# Patient Record
Sex: Male | Born: 1955 | Race: White | Hispanic: No | Marital: Single | Smoking: Current every day smoker
Health system: Southern US, Community
[De-identification: ages and names within clinical notes are randomized; demographics above are authoritative.]

## PROBLEM LIST (undated history)

## (undated) DIAGNOSIS — C4491 Basal cell carcinoma of skin, unspecified: Secondary | ICD-10-CM

## (undated) DIAGNOSIS — I10 Essential (primary) hypertension: Secondary | ICD-10-CM

## (undated) DIAGNOSIS — C439 Malignant melanoma of skin, unspecified: Secondary | ICD-10-CM

## (undated) DIAGNOSIS — M1990 Unspecified osteoarthritis, unspecified site: Secondary | ICD-10-CM

## (undated) DIAGNOSIS — G894 Chronic pain syndrome: Secondary | ICD-10-CM

## (undated) DIAGNOSIS — E119 Type 2 diabetes mellitus without complications: Secondary | ICD-10-CM

## (undated) DIAGNOSIS — F329 Major depressive disorder, single episode, unspecified: Secondary | ICD-10-CM

## (undated) DIAGNOSIS — F419 Anxiety disorder, unspecified: Secondary | ICD-10-CM

## (undated) DIAGNOSIS — Z973 Presence of spectacles and contact lenses: Secondary | ICD-10-CM

## (undated) HISTORY — PX: OTHER SURGICAL HISTORY: SHX169

## (undated) HISTORY — DX: Essential (primary) hypertension: I10

## (undated) HISTORY — DX: Basal cell carcinoma of skin, unspecified: C44.91

## (undated) HISTORY — DX: Chronic pain syndrome: G89.4

## (undated) HISTORY — DX: Type 2 diabetes mellitus without complications: E11.9

## (undated) HISTORY — DX: Malignant melanoma of skin, unspecified: C43.9

## (undated) HISTORY — DX: Anxiety disorder, unspecified: F41.9

## (undated) HISTORY — DX: Unspecified osteoarthritis, unspecified site: M19.90

## (undated) HISTORY — DX: Major depressive disorder, single episode, unspecified: F32.9

---

## 2003-05-09 ENCOUNTER — Ambulatory Visit (HOSPITAL_COMMUNITY): Admission: RE | Admit: 2003-05-09 | Discharge: 2003-05-09 | Payer: Self-pay | Admitting: Internal Medicine

## 2003-05-10 ENCOUNTER — Ambulatory Visit (HOSPITAL_COMMUNITY): Admission: RE | Admit: 2003-05-10 | Discharge: 2003-05-10 | Payer: Self-pay | Admitting: General Surgery

## 2003-05-15 ENCOUNTER — Encounter (INDEPENDENT_AMBULATORY_CARE_PROVIDER_SITE_OTHER): Payer: Self-pay | Admitting: *Deleted

## 2003-05-15 ENCOUNTER — Ambulatory Visit (HOSPITAL_BASED_OUTPATIENT_CLINIC_OR_DEPARTMENT_OTHER): Admission: RE | Admit: 2003-05-15 | Discharge: 2003-05-15 | Payer: Self-pay | Admitting: General Surgery

## 2003-05-15 ENCOUNTER — Ambulatory Visit (HOSPITAL_COMMUNITY): Admission: RE | Admit: 2003-05-15 | Discharge: 2003-05-15 | Payer: Self-pay | Admitting: General Surgery

## 2003-06-07 ENCOUNTER — Ambulatory Visit (HOSPITAL_COMMUNITY): Admission: RE | Admit: 2003-06-07 | Discharge: 2003-06-07 | Payer: Self-pay | Admitting: General Surgery

## 2003-07-11 ENCOUNTER — Inpatient Hospital Stay (HOSPITAL_COMMUNITY): Admission: RE | Admit: 2003-07-11 | Discharge: 2003-07-13 | Payer: Self-pay | Admitting: General Surgery

## 2003-07-11 ENCOUNTER — Encounter (INDEPENDENT_AMBULATORY_CARE_PROVIDER_SITE_OTHER): Payer: Self-pay | Admitting: *Deleted

## 2003-10-10 ENCOUNTER — Encounter: Admission: RE | Admit: 2003-10-10 | Discharge: 2003-11-29 | Payer: Self-pay | Admitting: General Surgery

## 2004-02-14 ENCOUNTER — Ambulatory Visit: Payer: Self-pay | Admitting: *Deleted

## 2004-03-05 ENCOUNTER — Ambulatory Visit: Payer: Self-pay | Admitting: Family Medicine

## 2004-03-10 ENCOUNTER — Ambulatory Visit: Payer: Self-pay | Admitting: Family Medicine

## 2004-04-10 ENCOUNTER — Ambulatory Visit: Payer: Self-pay | Admitting: Family Medicine

## 2004-04-18 ENCOUNTER — Ambulatory Visit: Payer: Self-pay | Admitting: Family Medicine

## 2004-05-12 ENCOUNTER — Ambulatory Visit: Payer: Self-pay | Admitting: Internal Medicine

## 2004-10-21 ENCOUNTER — Ambulatory Visit: Payer: Self-pay | Admitting: Family Medicine

## 2005-04-22 ENCOUNTER — Ambulatory Visit: Payer: Self-pay | Admitting: Family Medicine

## 2005-08-27 ENCOUNTER — Ambulatory Visit: Payer: Self-pay | Admitting: Family Medicine

## 2005-09-22 ENCOUNTER — Ambulatory Visit: Payer: Self-pay | Admitting: Family Medicine

## 2005-10-20 ENCOUNTER — Ambulatory Visit: Payer: Self-pay | Admitting: Family Medicine

## 2005-11-03 ENCOUNTER — Encounter (INDEPENDENT_AMBULATORY_CARE_PROVIDER_SITE_OTHER): Payer: Self-pay | Admitting: *Deleted

## 2005-11-03 ENCOUNTER — Ambulatory Visit (HOSPITAL_COMMUNITY): Admission: RE | Admit: 2005-11-03 | Discharge: 2005-11-03 | Payer: Self-pay | Admitting: Surgery

## 2005-12-29 ENCOUNTER — Ambulatory Visit: Payer: Self-pay | Admitting: Family Medicine

## 2006-02-24 ENCOUNTER — Ambulatory Visit: Payer: Self-pay | Admitting: Family Medicine

## 2006-03-17 ENCOUNTER — Ambulatory Visit: Payer: Self-pay | Admitting: Family Medicine

## 2006-03-18 ENCOUNTER — Ambulatory Visit: Payer: Self-pay | Admitting: Family Medicine

## 2006-04-05 ENCOUNTER — Ambulatory Visit: Payer: Self-pay | Admitting: Family Medicine

## 2006-04-14 ENCOUNTER — Ambulatory Visit: Payer: Self-pay | Admitting: Nurse Practitioner

## 2006-04-27 ENCOUNTER — Ambulatory Visit: Payer: Self-pay | Admitting: Family Medicine

## 2006-06-15 ENCOUNTER — Ambulatory Visit: Payer: Self-pay | Admitting: Family Medicine

## 2006-09-20 ENCOUNTER — Ambulatory Visit: Payer: Self-pay | Admitting: Family Medicine

## 2006-10-21 ENCOUNTER — Ambulatory Visit: Payer: Self-pay | Admitting: Family Medicine

## 2006-12-21 ENCOUNTER — Ambulatory Visit: Payer: Self-pay | Admitting: Internal Medicine

## 2007-02-23 ENCOUNTER — Encounter (INDEPENDENT_AMBULATORY_CARE_PROVIDER_SITE_OTHER): Payer: Self-pay | Admitting: *Deleted

## 2007-07-08 ENCOUNTER — Ambulatory Visit: Payer: Self-pay | Admitting: Internal Medicine

## 2007-07-08 LAB — CONVERTED CEMR LAB
ALT: 24 units/L (ref 0–53)
AST: 21 units/L (ref 0–37)
Albumin: 4.1 g/dL (ref 3.5–5.2)
Alkaline Phosphatase: 53 units/L (ref 39–117)
BUN: 14 mg/dL (ref 6–23)
Basophils Absolute: 0.1 10*3/uL (ref 0.0–0.1)
Basophils Relative: 1 % (ref 0–1)
CO2: 21 meq/L (ref 19–32)
Calcium: 9.6 mg/dL (ref 8.4–10.5)
Chloride: 106 meq/L (ref 96–112)
Cholesterol: 134 mg/dL (ref 0–200)
Creatinine, Ser: 0.9 mg/dL (ref 0.40–1.50)
Eosinophils Absolute: 0.4 10*3/uL (ref 0.0–0.7)
Eosinophils Relative: 4 % (ref 0–5)
Glucose, Bld: 144 mg/dL — ABNORMAL HIGH (ref 70–99)
HCT: 45.8 % (ref 39.0–52.0)
HDL: 43 mg/dL (ref >39)
Hemoglobin: 15.6 g/dL (ref 13.0–17.0)
LDL Cholesterol: 79 mg/dL (ref 0–99)
Lymphocytes Relative: 29 % (ref 12–46)
Lymphs Abs: 3.1 10*3/uL (ref 0.7–4.0)
MCHC: 34.1 g/dL (ref 30.0–36.0)
MCV: 92.2 fL (ref 78.0–100.0)
Monocytes Absolute: 0.9 10*3/uL (ref 0.1–1.0)
Monocytes Relative: 9 % (ref 3–12)
Neutro Abs: 6.3 10*3/uL (ref 1.7–7.7)
Neutrophils Relative %: 58 % (ref 43–77)
Platelets: 395 10*3/uL (ref 150–400)
Potassium: 4.9 meq/L (ref 3.5–5.3)
RBC: 4.97 M/uL (ref 4.22–5.81)
RDW: 13.1 % (ref 11.5–15.5)
Sodium: 139 meq/L (ref 135–145)
TSH: 0.982 microintl units/mL (ref 0.350–5.50)
Total Bilirubin: 0.7 mg/dL (ref 0.3–1.2)
Total CHOL/HDL Ratio: 3.1
Total Protein: 6.7 g/dL (ref 6.0–8.3)
Triglycerides: 58 mg/dL (ref <150)
VLDL: 12 mg/dL (ref 0–40)
WBC: 10.8 10*3/uL — ABNORMAL HIGH (ref 4.0–10.5)

## 2007-07-22 ENCOUNTER — Ambulatory Visit: Payer: Self-pay | Admitting: Family Medicine

## 2007-10-26 ENCOUNTER — Ambulatory Visit: Payer: Self-pay | Admitting: Internal Medicine

## 2007-11-08 ENCOUNTER — Ambulatory Visit: Payer: Self-pay | Admitting: Internal Medicine

## 2008-02-15 ENCOUNTER — Ambulatory Visit: Payer: Self-pay | Admitting: Internal Medicine

## 2008-05-01 ENCOUNTER — Ambulatory Visit: Payer: Self-pay | Admitting: Internal Medicine

## 2008-06-11 ENCOUNTER — Ambulatory Visit: Payer: Self-pay | Admitting: Internal Medicine

## 2008-07-10 ENCOUNTER — Ambulatory Visit: Payer: Self-pay | Admitting: Family Medicine

## 2008-09-03 ENCOUNTER — Ambulatory Visit: Payer: Self-pay | Admitting: Family Medicine

## 2008-09-03 ENCOUNTER — Encounter (INDEPENDENT_AMBULATORY_CARE_PROVIDER_SITE_OTHER): Payer: Self-pay | Admitting: Adult Health

## 2008-09-03 LAB — CONVERTED CEMR LAB
ALT: 43 units/L (ref 0–53)
AST: 34 units/L (ref 0–37)
Albumin: 4.2 g/dL (ref 3.5–5.2)
Alkaline Phosphatase: 59 units/L (ref 39–117)
BUN: 18 mg/dL (ref 6–23)
Basophils Absolute: 0.1 10*3/uL (ref 0.0–0.1)
Basophils Relative: 1 % (ref 0–1)
CO2: 22 meq/L (ref 19–32)
Calcium: 9.5 mg/dL (ref 8.4–10.5)
Chloride: 106 meq/L (ref 96–112)
Cholesterol: 150 mg/dL (ref 0–200)
Creatinine, Ser: 0.94 mg/dL (ref 0.40–1.50)
Eosinophils Absolute: 0.5 10*3/uL (ref 0.0–0.7)
Eosinophils Relative: 4 % (ref 0–5)
Glucose, Bld: 95 mg/dL (ref 70–99)
HCT: 46.4 % (ref 39.0–52.0)
HDL: 51 mg/dL (ref >39)
Hemoglobin: 15.9 g/dL (ref 13.0–17.0)
LDL Cholesterol: 87 mg/dL (ref 0–99)
Lymphocytes Relative: 34 % (ref 12–46)
Lymphs Abs: 4.3 10*3/uL — ABNORMAL HIGH (ref 0.7–4.0)
MCHC: 34.3 g/dL (ref 30.0–36.0)
MCV: 94.3 fL (ref 78.0–100.0)
Microalb, Ur: 0.5 mg/dL (ref 0.00–1.89)
Monocytes Absolute: 1.4 10*3/uL — ABNORMAL HIGH (ref 0.1–1.0)
Monocytes Relative: 11 % (ref 3–12)
Neutro Abs: 6.3 10*3/uL (ref 1.7–7.7)
Neutrophils Relative %: 50 % (ref 43–77)
PSA: 1.55 ng/mL (ref 0.10–4.00)
Platelets: 312 10*3/uL (ref 150–400)
Potassium: 4.1 meq/L (ref 3.5–5.3)
RBC: 4.92 M/uL (ref 4.22–5.81)
RDW: 13.6 % (ref 11.5–15.5)
Sodium: 140 meq/L (ref 135–145)
TSH: 1.435 microintl units/mL (ref 0.350–4.500)
Total Bilirubin: 0.6 mg/dL (ref 0.3–1.2)
Total CHOL/HDL Ratio: 2.9
Total Protein: 7.1 g/dL (ref 6.0–8.3)
Triglycerides: 62 mg/dL (ref <150)
VLDL: 12 mg/dL (ref 0–40)
WBC: 12.5 10*3/uL — ABNORMAL HIGH (ref 4.0–10.5)

## 2008-09-11 ENCOUNTER — Ambulatory Visit: Payer: Self-pay | Admitting: Family Medicine

## 2008-10-02 ENCOUNTER — Ambulatory Visit: Payer: Self-pay | Admitting: Internal Medicine

## 2008-10-16 ENCOUNTER — Ambulatory Visit: Payer: Self-pay | Admitting: Oncology

## 2008-11-08 LAB — CBC WITH DIFFERENTIAL/PLATELET
BASO%: 0.3 % (ref 0.0–2.0)
Basophils Absolute: 0 10*3/uL (ref 0.0–0.1)
EOS%: 6.4 % (ref 0.0–7.0)
Eosinophils Absolute: 0.8 10*3/uL — ABNORMAL HIGH (ref 0.0–0.5)
HCT: 44.6 % (ref 38.4–49.9)
HGB: 15.3 g/dL (ref 13.0–17.1)
LYMPH%: 27.3 % (ref 14.0–49.0)
MCH: 32.3 pg (ref 27.2–33.4)
MCHC: 34.3 g/dL (ref 32.0–36.0)
MCV: 94 fL (ref 79.3–98.0)
MONO#: 1.1 10*3/uL — ABNORMAL HIGH (ref 0.1–0.9)
MONO%: 8.8 % (ref 0.0–14.0)
NEUT#: 6.9 10*3/uL — ABNORMAL HIGH (ref 1.5–6.5)
NEUT%: 57.2 % (ref 39.0–75.0)
Platelets: 254 10*3/uL (ref 140–400)
RBC: 4.74 10*6/uL (ref 4.20–5.82)
RDW: 12.9 % (ref 11.0–14.6)
WBC: 12.1 10*3/uL — ABNORMAL HIGH (ref 4.0–10.3)
lymph#: 3.3 10*3/uL (ref 0.9–3.3)

## 2008-11-08 LAB — COMPREHENSIVE METABOLIC PANEL
ALT: 32 U/L (ref 0–53)
AST: 29 U/L (ref 0–37)
Albumin: 3.9 g/dL (ref 3.5–5.2)
Alkaline Phosphatase: 51 U/L (ref 39–117)
BUN: 20 mg/dL (ref 6–23)
CO2: 24 mEq/L (ref 19–32)
Calcium: 9.1 mg/dL (ref 8.4–10.5)
Chloride: 104 mEq/L (ref 96–112)
Creatinine, Ser: 1.01 mg/dL (ref 0.40–1.50)
Glucose, Bld: 185 mg/dL — ABNORMAL HIGH (ref 70–99)
Potassium: 4.4 mEq/L (ref 3.5–5.3)
Sodium: 137 mEq/L (ref 135–145)
Total Bilirubin: 0.6 mg/dL (ref 0.3–1.2)
Total Protein: 6.6 g/dL (ref 6.0–8.3)

## 2008-11-08 LAB — LACTATE DEHYDROGENASE: LDH: 147 U/L (ref 94–250)

## 2008-12-12 ENCOUNTER — Ambulatory Visit: Payer: Self-pay | Admitting: Internal Medicine

## 2009-03-07 ENCOUNTER — Encounter (INDEPENDENT_AMBULATORY_CARE_PROVIDER_SITE_OTHER): Payer: Self-pay | Admitting: Adult Health

## 2009-03-07 ENCOUNTER — Ambulatory Visit: Payer: Self-pay | Admitting: Internal Medicine

## 2009-03-07 LAB — CONVERTED CEMR LAB
ALT: 39 units/L (ref 0–53)
AST: 37 units/L (ref 0–37)
Albumin: 4.1 g/dL (ref 3.5–5.2)
Alkaline Phosphatase: 65 units/L (ref 39–117)
BUN: 15 mg/dL (ref 6–23)
CO2: 18 meq/L — ABNORMAL LOW (ref 19–32)
Calcium: 9 mg/dL (ref 8.4–10.5)
Chloride: 106 meq/L (ref 96–112)
Cholesterol: 156 mg/dL (ref 0–200)
Creatinine, Ser: 0.97 mg/dL (ref 0.40–1.50)
Glucose, Bld: 112 mg/dL — ABNORMAL HIGH (ref 70–99)
HDL: 41 mg/dL (ref >39)
LDL Cholesterol: 102 mg/dL — ABNORMAL HIGH (ref 0–99)
Potassium: 4.3 meq/L (ref 3.5–5.3)
Sodium: 139 meq/L (ref 135–145)
Total Bilirubin: 0.7 mg/dL (ref 0.3–1.2)
Total CHOL/HDL Ratio: 3.8
Total Protein: 7 g/dL (ref 6.0–8.3)
Triglycerides: 65 mg/dL (ref <150)
VLDL: 13 mg/dL (ref 0–40)

## 2009-03-15 ENCOUNTER — Ambulatory Visit: Payer: Self-pay | Admitting: Internal Medicine

## 2009-04-09 ENCOUNTER — Ambulatory Visit: Payer: Self-pay | Admitting: Internal Medicine

## 2009-04-09 ENCOUNTER — Encounter (INDEPENDENT_AMBULATORY_CARE_PROVIDER_SITE_OTHER): Payer: Self-pay | Admitting: Adult Health

## 2009-04-09 LAB — CONVERTED CEMR LAB
BUN: 26 mg/dL — ABNORMAL HIGH (ref 6–23)
CO2: 20 meq/L (ref 19–32)
Calcium: 9.2 mg/dL (ref 8.4–10.5)
Chloride: 102 meq/L (ref 96–112)
Creatinine, Ser: 1.11 mg/dL (ref 0.40–1.50)
Glucose, Bld: 108 mg/dL — ABNORMAL HIGH (ref 70–99)
Potassium: 4.3 meq/L (ref 3.5–5.3)
Sodium: 137 meq/L (ref 135–145)
Vit D, 25-Hydroxy: 42 ng/mL (ref 30–89)

## 2009-06-17 ENCOUNTER — Ambulatory Visit: Payer: Self-pay | Admitting: Internal Medicine

## 2009-06-17 ENCOUNTER — Encounter (INDEPENDENT_AMBULATORY_CARE_PROVIDER_SITE_OTHER): Payer: Self-pay | Admitting: Adult Health

## 2009-06-17 LAB — CONVERTED CEMR LAB
ALT: 32 units/L (ref 0–53)
AST: 24 units/L (ref 0–37)
Albumin: 4.1 g/dL (ref 3.5–5.2)
Alkaline Phosphatase: 53 units/L (ref 39–117)
BUN: 15 mg/dL (ref 6–23)
CO2: 21 meq/L (ref 19–32)
Calcium: 9.1 mg/dL (ref 8.4–10.5)
Chloride: 105 meq/L (ref 96–112)
Cholesterol: 146 mg/dL (ref 0–200)
Creatinine, Ser: 0.91 mg/dL (ref 0.40–1.50)
Glucose, Bld: 166 mg/dL — ABNORMAL HIGH (ref 70–99)
HDL: 41 mg/dL (ref >39)
Hgb A1c MFr Bld: 6.9 % — ABNORMAL HIGH (ref 4.6–6.1)
LDL Cholesterol: 84 mg/dL (ref 0–99)
Potassium: 4.8 meq/L (ref 3.5–5.3)
Pro B Natriuretic peptide (BNP): 19 pg/mL (ref 0.0–100.0)
Sodium: 138 meq/L (ref 135–145)
Total Bilirubin: 0.6 mg/dL (ref 0.3–1.2)
Total CHOL/HDL Ratio: 3.6
Total Protein: 6.8 g/dL (ref 6.0–8.3)
Triglycerides: 105 mg/dL (ref <150)
VLDL: 21 mg/dL (ref 0–40)

## 2009-07-09 ENCOUNTER — Ambulatory Visit: Payer: Self-pay | Admitting: Internal Medicine

## 2009-07-24 ENCOUNTER — Ambulatory Visit: Payer: Self-pay | Admitting: Internal Medicine

## 2009-07-24 ENCOUNTER — Encounter (INDEPENDENT_AMBULATORY_CARE_PROVIDER_SITE_OTHER): Payer: Self-pay | Admitting: Adult Health

## 2009-07-24 LAB — CONVERTED CEMR LAB
BUN: 21 mg/dL (ref 6–23)
CO2: 22 meq/L (ref 19–32)
Calcium: 9.5 mg/dL (ref 8.4–10.5)
Chloride: 106 meq/L (ref 96–112)
Creatinine, Ser: 1.01 mg/dL (ref 0.40–1.50)
Glucose, Bld: 128 mg/dL — ABNORMAL HIGH (ref 70–99)
Potassium: 4.6 meq/L (ref 3.5–5.3)
Sodium: 138 meq/L (ref 135–145)

## 2009-09-16 ENCOUNTER — Ambulatory Visit: Payer: Self-pay | Admitting: Internal Medicine

## 2009-09-16 ENCOUNTER — Encounter (INDEPENDENT_AMBULATORY_CARE_PROVIDER_SITE_OTHER): Payer: Self-pay | Admitting: Adult Health

## 2009-09-16 LAB — CONVERTED CEMR LAB: Microalb, Ur: 0.5 mg/dL (ref 0.00–1.89)

## 2010-01-15 ENCOUNTER — Ambulatory Visit: Payer: Self-pay | Admitting: Internal Medicine

## 2010-01-15 LAB — CONVERTED CEMR LAB
ALT: 26 units/L (ref 0–53)
AST: 23 units/L (ref 0–37)
Albumin: 4 g/dL (ref 3.5–5.2)
Alkaline Phosphatase: 45 units/L (ref 39–117)
BUN: 24 mg/dL — ABNORMAL HIGH (ref 6–23)
CO2: 24 meq/L (ref 19–32)
Calcium: 9 mg/dL (ref 8.4–10.5)
Chloride: 107 meq/L (ref 96–112)
Creatinine, Ser: 0.97 mg/dL (ref 0.40–1.50)
Glucose, Bld: 118 mg/dL — ABNORMAL HIGH (ref 70–99)
Hgb A1c MFr Bld: 7.3 % — ABNORMAL HIGH (ref <5.7)
Magnesium: 1.6 mg/dL (ref 1.5–2.5)
Microalb, Ur: 1.12 mg/dL (ref 0.00–1.89)
Potassium: 4.4 meq/L (ref 3.5–5.3)
Sodium: 141 meq/L (ref 135–145)
Total Bilirubin: 0.6 mg/dL (ref 0.3–1.2)
Total Protein: 6.6 g/dL (ref 6.0–8.3)

## 2010-10-24 NOTE — Op Note (Signed)
NAME:  Charles Manning, Charles Manning                          ACCOUNT NO.:  [id number]   MEDICAL RECORD NO.:  [id number]                   PATIENT TYPE:  INP   LOCATION:  6731                                 FACILITY:  [facility]   PHYSICIAN:  Rose Phi. Maple Hudson, M.D.                DATE OF BIRTH:  1955/10/05   DATE OF PROCEDURE:  07/11/2003  DATE OF DISCHARGE:                                 OPERATIVE REPORT   PREOPERATIVE DIAGNOSIS:  T3A, N1A melanoma right lateral thigh.   POSTOPERATIVE DIAGNOSIS:  T3A, N1A melanoma right lateral thigh.   PROCEDURE:  Right superficial groin dissection.   SURGEON:  Rose Phi. Maple Hudson, M.D.   ASSISTANT:  Currie Paris, M.D.   ANESTHESIA:  General.   DESCRIPTION OF PROCEDURE:  After a suitable general endotracheal anesthesia  was induced, the patient was placed in the supine position.  We slightly  frog legged the right leg to expose the right groin better.  The area was  then prepped and draped.  An incision incorporating and excising the  previous sentinel node biopsy site was outlined beginning just medial to the  anterior superior spine on the right side and extending down to the right  thigh at the distal part of the femoral triangle.  The incision was then  made and we dissected on the upper end down onto the abdominal fascia and  then dissected over the shelving portion of Poupart's ligament down into the  groin.  We then mobilized flaps medially and laterally exposing the vastus  lateralis laterally and to the pubis medially.  Then dissected on the medial  side down onto the adductor muscles and then distally until we identified  the saphenous vein which we doubly ligated and divided.  We then dissected  from medial to lateral and identified the saphenous vein going into the  femoral vein and doubly ligated that and also ligated it and divided it.  We  then exposed the femoral triangle with the femoral vein and the femoral  artery and then dissected all of  the tissue off of the femoral artery and  femoral vein exposing this with identification of the nerves.  We completed  that dissection superiorly to the abdominal fascia and then laterally with  identification and preservation of the lateral femoral cutaneous nerve.  We  then removed all of this tissue.  This gave good exposure and complete  dissection out of the femoral triangle with exposure of the femoral vein and  the femoral artery.   I then mobilized the head of the Sartorius muscle at its junction with the  anterior superior spine and divided that.  I then mobilized enough so that  we could transpose it across the femoral vessels and then suture the  tendinous portion of the Sartorius muscle to the abdominal fascia to cover  the vessels.  Following the completion of this, we had good  hemostasis.  We  thoroughly irrigated the area with saline.  Two 19 French drains were  inserted and brought out through separate stab wounds.  Subcutaneous tissue  was closed with 3-0 Vicryl and the skin with staples.  Dressing applied.  The patient transferred to the recovery room in satisfactory condition  having tolerated the procedure well.                                               Rose Phi. Maple Hudson, M.D.    PRY/MEDQ  D:  07/11/2003  T:  07/11/2003  Job:  [id number]

## 2010-10-24 NOTE — Op Note (Signed)
NAME:  Charles Manning, Charles Manning                          ACCOUNT NO.:  [id number]   MEDICAL RECORD NO.:  [id number]                   PATIENT TYPE:  AMB   LOCATION:  DSC                                  FACILITY:  [facility]   PHYSICIAN:  Rose Phi. Maple Hudson, M.D.                DATE OF BIRTH:  1955-10-19   DATE OF PROCEDURE:  05/15/2003  DATE OF DISCHARGE:                                 OPERATIVE REPORT   PREOPERATIVE DIAGNOSIS:  T2A melanoma of right lateral thigh.   POSTOPERATIVE DIAGNOSIS:  T2A melanoma of right lateral thigh.   OPERATION PERFORMED:  1. Blue dye injection.  2. Right inguinal sentinel lymph node biopsy.  3. Wide excision of melanoma of right thigh with primary closure.   SURGEON:  Rose Phi. Maple Hudson, M.D.   ANESTHESIA:  General.   INDICATIONS FOR PROCEDURE:  This 55 year old married white male had  presented to [facility] and then to Dr. Jorja Loa with a melanoma on the  right lateral thigh, fairly low near the knee.  A biopsy had shown this to  be a melanoma in excess of 2 mm thick without any ulceration.  A  preoperative lymphoscintigram had shown the primary lymph node draining  basin for that area was the right inguinal and femoral area.   DESCRIPTION OF PROCEDURE:  Prior to coming to the operating room 1.5 mCi of  technetium sulfur colloid was injected intradermally around the primary  melanoma site.  After suitable general anesthesia was induced, the patient  was placed in the supine position and about 0.5 mL of Lymphazurin blue was  injected intradermally around the primary melanoma site.  We then prepped  and draped the groin.  Careful scanning with the NeoProbe revealed one hot  and one cooler spot in the femoral area.  A vertical incision was then made  and the first lymph node that was lower down was blue and hot and we removed  it as a sentinel node.  Then just proximal to that was a cooler bluish lymph  node which I also excised as a sentinel node.  There were no other  palpable  blue or hot nodes.  This incision was then closed with 3-0 Vicryl and  subcuticular 4-0 Monocryl and Steri-Strips.  We then reprepped and draped  the right lateral knee and lower thigh.  A long elliptical incision was  outlined around the melanoma.  It was then excised down to the fascia.  Hemostasis was obtained with the cautery.  We mobilized the medial and  lateral flap somewhat  and this gave a 9 x 5 cm defect.  It was closed in two layers with 3-0  Vicryl and 4-0 nylon suture.  Dressings were then applied.  The patient was  transferred to the recovery room in satisfactory condition having tolerated  the procedure well.  Rose Phi. Maple Hudson, M.D.    PRY/MEDQ  D:  05/15/2003  T:  05/16/2003  Job:  [id number]   cc:   Dr. Margarette Canada, [facility]   Ria Bush. Jorja Loa, M.D.  [address]  [place]  [place] [address]  Fax: [phone]

## 2010-10-24 NOTE — Op Note (Signed)
NAMEJAMY, CLECKLER                ACCOUNT NO.:  [id number]   MEDICAL RECORD NO.:  [id number]          PATIENT TYPE:  AMB   LOCATION:  DAY                          FACILITY:  [facility]   PHYSICIAN:  Wilmon Arms. Corliss Skains, M.D. DATE OF BIRTH:  [birth year]   DATE OF PROCEDURE:  11/03/2005  DATE OF DISCHARGE:                                 OPERATIVE REPORT   PREOPERATIVE DIAGNOSIS:  1.  Basal cell carcinoma of the mid-back.  2.  Dysplastic nevus of the left shoulder.   POSTOPERATIVE DIAGNOSIS:  1.  Basal cell carcinoma of the mid-back.  2.  Dysplastic nevus of the left shoulder.   PROCEDURE PERFORMED:  Wide excision of basal cell carcinoma of the mid-back  and dysplastic nevus of the left shoulder.   SURGEON:  Wilmon Arms. Tsuei, M.D.   ANESTHESIA:  Local MAC.   INDICATIONS:  The patient is a 55 year old male with a past medical history  of malignant melanoma the right thigh who presents with a suspicious lesion  from the left shoulder and mid-back.  The mid-back lesion was a basal cell  carcinoma incompletely excised.  The left shoulder lesion was a dysplastic  nevus with some atypia.  He presents for wide excision.   DESCRIPTION OF PROCEDURE:  The patient was brought to the operating room and  placed in the lateral position on his right side with appropriate padding.  An axillary roll was placed.  His back and left shoulder were prepped with  Betadine and draped in a sterile fashion.  A time out was taken to assure  the proper patient and the proper procedure.  Both sites were infiltrated  with a total of 20 mL of 0.25% Marcaine with epinephrine.  An elliptical  incision was made around the left shoulder lesion in a transverse fashion.  Hemostasis was obtained with cautery.  The full thickness skin was excised  with some subcutaneous fat.  Once hemostasis was adequate, the wound was  closed with a line of skin staples.  We repeated this with the mid-back  lesion.  This was excised was as  a  full thickness skin biopsy through into the subcutaneous tissues.  Once  hemostasis was felt to be adequate, the wound was closed with staples.  4x4  dressings were applied.  The patient was awakened and brought to the  recovery room in stable condition.  All sponge, needle, and instrument  counts were correct.      Wilmon Arms. Tsuei, M.D.  Electronically Signed     MKT/MEDQ  D:  11/03/2005  T:  11/03/2005  Job:  [id number]   cc:   Ria Bush. Jorja Loa, M.D.  Fax: [phone]

## 2010-11-07 ENCOUNTER — Ambulatory Visit: Payer: Self-pay | Admitting: Neurosurgery

## 2010-11-12 ENCOUNTER — Encounter: Payer: Self-pay | Attending: Neurosurgery | Admitting: Neurosurgery

## 2010-11-12 DIAGNOSIS — M545 Low back pain, unspecified: Secondary | ICD-10-CM

## 2010-11-12 DIAGNOSIS — M25579 Pain in unspecified ankle and joints of unspecified foot: Secondary | ICD-10-CM

## 2010-11-12 DIAGNOSIS — M129 Arthropathy, unspecified: Secondary | ICD-10-CM | POA: Insufficient documentation

## 2010-11-12 DIAGNOSIS — G8928 Other chronic postprocedural pain: Secondary | ICD-10-CM

## 2010-11-12 DIAGNOSIS — G894 Chronic pain syndrome: Secondary | ICD-10-CM | POA: Insufficient documentation

## 2010-11-12 DIAGNOSIS — E119 Type 2 diabetes mellitus without complications: Secondary | ICD-10-CM | POA: Insufficient documentation

## 2010-11-12 DIAGNOSIS — M79609 Pain in unspecified limb: Secondary | ICD-10-CM | POA: Insufficient documentation

## 2010-11-12 DIAGNOSIS — I890 Lymphedema, not elsewhere classified: Secondary | ICD-10-CM | POA: Insufficient documentation

## 2010-11-12 DIAGNOSIS — I10 Essential (primary) hypertension: Secondary | ICD-10-CM | POA: Insufficient documentation

## 2010-11-13 NOTE — Group Therapy Note (Signed)
Charles Manning is referred here through [facility] due to his status for evaluation of right lower extremity pain status post lymph mid resection in his right groin some years ago that was done here in [place], I do not remember the doctor, but he states since that time since they took the surgical drains out, he has had right lower extremity lymphedema. He has been told by surgeons that there is nothing to do for this.  He does have probably 2+ swelling there today.  His pain is mostly in that area of the knees and especially in the right lower extremity.  He rates the pain about an 8, sharp pain.  General activity level is about an 8.  Pain is worse in day time and evening.  Walking, standing inactivity aggravate, rest and medications tend to help.  Mobility, he uses a cane.  He walks with a limp.  He does climb steps and drive.  He is disabled.  REVIEW OF SYSTEMS:  Notable for those difficulties as well as some blood sugar spikes and weakness, paresthesias, confusion, depression, anxiety. No suicidal thoughts.  He has had CT MRI before that was not available for review.  Past medical history is significant for hypertension, diabetes and arthritis.  SOCIAL HISTORY:  He is single, lives alone.  Family history is significant for diabetes.  Physical exam; his blood pressure is 114/74, his pulse is 67, respirations 18, O2 sats 96% on room air.  His motor strength in iliopsoas and quadriceps 5/5 bilaterally.  Sensation is diminished in the right lower extremity, good in the left lower extremity.  He has 2+ reflexes at the quad and EHL bilaterally.  He does have 2+ edema in the right lower extremity.  ASSESSMENT: 1. Right lower extremity lymphedema status post lymph node resection     some years ago in [facility].  I did not see an e-chart today.     He states it was done at an outpatient center. 2. Chronic pain syndrome.  PLAN:  He is currently on insulin for his diabetes.   He does not know the dose.  Cymbalta 30 mg a day, Neurontin 800 mg a day, lisinopril 10 mg a day, Actos 30 mg a day.  He does not report any drug allergies.  We will obtain UDS today.  He understands we will not be prescribing any medicines till that returns.  He will follow up with Dr. Wynn Banker in 1 month.  His questions were encouraged and answered.     Keelin Sheridan L. Blima Dessert Electronically Signed    RLW/MedQ D:  11/12/2010 14:40:28  T:  11/13/2010 05:45:03  Job #:  [id number]

## 2010-11-19 ENCOUNTER — Ambulatory Visit: Payer: Self-pay | Admitting: Physical Medicine and Rehabilitation

## 2010-12-16 ENCOUNTER — Ambulatory Visit: Payer: Self-pay | Admitting: Physical Medicine & Rehabilitation

## 2011-01-07 ENCOUNTER — Ambulatory Visit: Payer: Self-pay | Admitting: Physical Medicine and Rehabilitation

## 2011-09-21 ENCOUNTER — Encounter: Payer: Self-pay | Admitting: Surgery

## 2011-09-25 ENCOUNTER — Encounter: Payer: Self-pay | Admitting: Internal Medicine

## 2011-09-25 DIAGNOSIS — G894 Chronic pain syndrome: Secondary | ICD-10-CM

## 2011-09-25 DIAGNOSIS — E118 Type 2 diabetes mellitus with unspecified complications: Secondary | ICD-10-CM | POA: Insufficient documentation

## 2011-09-25 DIAGNOSIS — E119 Type 2 diabetes mellitus without complications: Secondary | ICD-10-CM

## 2011-09-25 DIAGNOSIS — F419 Anxiety disorder, unspecified: Secondary | ICD-10-CM

## 2011-09-25 DIAGNOSIS — C4491 Basal cell carcinoma of skin, unspecified: Secondary | ICD-10-CM | POA: Insufficient documentation

## 2011-09-25 DIAGNOSIS — M1990 Unspecified osteoarthritis, unspecified site: Secondary | ICD-10-CM

## 2011-09-25 DIAGNOSIS — F329 Major depressive disorder, single episode, unspecified: Secondary | ICD-10-CM | POA: Insufficient documentation

## 2011-09-25 DIAGNOSIS — E1165 Type 2 diabetes mellitus with hyperglycemia: Secondary | ICD-10-CM

## 2011-09-25 DIAGNOSIS — F32A Depression, unspecified: Secondary | ICD-10-CM

## 2011-09-25 DIAGNOSIS — I10 Essential (primary) hypertension: Secondary | ICD-10-CM

## 2011-09-25 DIAGNOSIS — Z0000 Encounter for general adult medical examination without abnormal findings: Secondary | ICD-10-CM | POA: Insufficient documentation

## 2011-09-25 DIAGNOSIS — C439 Malignant melanoma of skin, unspecified: Secondary | ICD-10-CM

## 2011-09-25 HISTORY — DX: Chronic pain syndrome: G89.4

## 2011-09-25 HISTORY — DX: Basal cell carcinoma of skin, unspecified: C44.91

## 2011-09-25 HISTORY — DX: Malignant melanoma of skin, unspecified: C43.9

## 2011-09-25 HISTORY — DX: Unspecified osteoarthritis, unspecified site: M19.90

## 2011-09-25 HISTORY — DX: Anxiety disorder, unspecified: F41.9

## 2011-09-25 HISTORY — DX: Type 2 diabetes mellitus without complications: E11.9

## 2011-09-25 HISTORY — DX: Essential (primary) hypertension: I10

## 2011-09-25 HISTORY — DX: Depression, unspecified: F32.A

## 2011-10-02 ENCOUNTER — Ambulatory Visit: Payer: Self-pay | Admitting: Internal Medicine

## 2011-10-02 DIAGNOSIS — Z0289 Encounter for other administrative examinations: Secondary | ICD-10-CM

## 2012-01-19 ENCOUNTER — Encounter (HOSPITAL_COMMUNITY): Payer: Self-pay | Admitting: Emergency Medicine

## 2012-01-19 ENCOUNTER — Inpatient Hospital Stay (HOSPITAL_COMMUNITY)
Admission: EM | Admit: 2012-01-19 | Discharge: 2012-01-20 | DRG: 603 | Disposition: A | Discharge status: Home / Self Care | Payer: Medicare Other | Attending: Internal Medicine | Admitting: Internal Medicine

## 2012-01-19 DIAGNOSIS — G894 Chronic pain syndrome: Secondary | ICD-10-CM | POA: Diagnosis present

## 2012-01-19 DIAGNOSIS — E781 Pure hyperglyceridemia: Secondary | ICD-10-CM | POA: Diagnosis present

## 2012-01-19 DIAGNOSIS — Z79899 Other long term (current) drug therapy: Secondary | ICD-10-CM

## 2012-01-19 DIAGNOSIS — F3289 Other specified depressive episodes: Secondary | ICD-10-CM | POA: Diagnosis present

## 2012-01-19 DIAGNOSIS — IMO0002 Reserved for concepts with insufficient information to code with codable children: Principal | ICD-10-CM | POA: Diagnosis present

## 2012-01-19 DIAGNOSIS — L02414 Cutaneous abscess of left upper limb: Secondary | ICD-10-CM

## 2012-01-19 DIAGNOSIS — F1410 Cocaine abuse, uncomplicated: Secondary | ICD-10-CM | POA: Diagnosis present

## 2012-01-19 DIAGNOSIS — L0291 Cutaneous abscess, unspecified: Secondary | ICD-10-CM | POA: Diagnosis present

## 2012-01-19 DIAGNOSIS — D72829 Elevated white blood cell count, unspecified: Secondary | ICD-10-CM | POA: Diagnosis present

## 2012-01-19 DIAGNOSIS — F3189 Other bipolar disorder: Secondary | ICD-10-CM

## 2012-01-19 DIAGNOSIS — E1165 Type 2 diabetes mellitus with hyperglycemia: Secondary | ICD-10-CM

## 2012-01-19 DIAGNOSIS — E118 Type 2 diabetes mellitus with unspecified complications: Secondary | ICD-10-CM

## 2012-01-19 DIAGNOSIS — Z794 Long term (current) use of insulin: Secondary | ICD-10-CM

## 2012-01-19 DIAGNOSIS — M1990 Unspecified osteoarthritis, unspecified site: Secondary | ICD-10-CM | POA: Diagnosis present

## 2012-01-19 DIAGNOSIS — F17200 Nicotine dependence, unspecified, uncomplicated: Secondary | ICD-10-CM

## 2012-01-19 DIAGNOSIS — F329 Major depressive disorder, single episode, unspecified: Secondary | ICD-10-CM

## 2012-01-19 DIAGNOSIS — I10 Essential (primary) hypertension: Secondary | ICD-10-CM | POA: Diagnosis present

## 2012-01-19 DIAGNOSIS — R45851 Suicidal ideations: Secondary | ICD-10-CM

## 2012-01-19 DIAGNOSIS — F32A Depression, unspecified: Secondary | ICD-10-CM | POA: Diagnosis present

## 2012-01-19 DIAGNOSIS — F419 Anxiety disorder, unspecified: Secondary | ICD-10-CM | POA: Diagnosis present

## 2012-01-19 LAB — CBC WITH DIFFERENTIAL/PLATELET
Basophils Absolute: 0.1 10*3/uL (ref 0.0–0.1)
Lymphocytes Relative: 20 % (ref 12–46)
Lymphs Abs: 3.7 10*3/uL (ref 0.7–4.0)
Neutro Abs: 12.7 10*3/uL — ABNORMAL HIGH (ref 1.7–7.7)
Neutrophils Relative %: 67 % (ref 43–77)
Platelets: 330 10*3/uL (ref 150–400)
RBC: 4.64 MIL/uL (ref 4.22–5.81)
RDW: 12.2 % (ref 11.5–15.5)
WBC: 18.9 10*3/uL — ABNORMAL HIGH (ref 4.0–10.5)

## 2012-01-19 LAB — COMPREHENSIVE METABOLIC PANEL
ALT: 39 U/L (ref 0–53)
AST: 25 U/L (ref 0–37)
Alkaline Phosphatase: 60 U/L (ref 39–117)
CO2: 22 mEq/L (ref 19–32)
Calcium: 8.6 mg/dL (ref 8.4–10.5)
Chloride: 98 mEq/L (ref 96–112)
GFR calc Af Amer: >90 mL/min (ref >90)
GFR calc non Af Amer: >90 mL/min (ref >90)
Glucose, Bld: 253 mg/dL — ABNORMAL HIGH (ref 70–99)
Sodium: 131 mEq/L — ABNORMAL LOW (ref 135–145)
Total Bilirubin: 0.3 mg/dL (ref 0.3–1.2)

## 2012-01-19 LAB — RAPID URINE DRUG SCREEN, HOSP PERFORMED
Barbiturates: NOT DETECTED
Benzodiazepines: NOT DETECTED

## 2012-01-19 LAB — BASIC METABOLIC PANEL
CO2: 23 mEq/L (ref 19–32)
Calcium: 9 mg/dL (ref 8.4–10.5)
Glucose, Bld: 267 mg/dL — ABNORMAL HIGH (ref 70–99)
Potassium: 4.1 mEq/L (ref 3.5–5.1)
Sodium: 132 mEq/L — ABNORMAL LOW (ref 135–145)

## 2012-01-19 LAB — ETHANOL: Alcohol, Ethyl (B): <11 mg/dL (ref 0–11)

## 2012-01-19 LAB — CBC
Hemoglobin: 14.1 g/dL (ref 13.0–17.0)
MCH: 31.1 pg (ref 26.0–34.0)
Platelets: 323 10*3/uL (ref 150–400)
RBC: 4.54 MIL/uL (ref 4.22–5.81)
WBC: 18.9 10*3/uL — ABNORMAL HIGH (ref 4.0–10.5)

## 2012-01-19 LAB — DIFFERENTIAL
Basophils Relative: 0 % (ref 0–1)
Eosinophils Absolute: 0.6 10*3/uL (ref 0.0–0.7)
Eosinophils Relative: 3 % (ref 0–5)
Lymphs Abs: 3.7 10*3/uL (ref 0.7–4.0)
Monocytes Absolute: 1.8 10*3/uL — ABNORMAL HIGH (ref 0.1–1.0)
Monocytes Relative: 10 % (ref 3–12)

## 2012-01-19 LAB — GLUCOSE, CAPILLARY
Glucose-Capillary: 221 mg/dL — ABNORMAL HIGH (ref 70–99)
Glucose-Capillary: 210 mg/dL — ABNORMAL HIGH (ref 70–99)
Glucose-Capillary: 416 mg/dL — ABNORMAL HIGH (ref 70–99)
Glucose-Capillary: 153 mg/dL — ABNORMAL HIGH (ref 70–99)

## 2012-01-19 LAB — PROTIME-INR: Prothrombin Time: 13.8 seconds (ref 11.6–15.2)

## 2012-01-19 MED ORDER — GABAPENTIN 100 MG PO CAPS
100.0000 mg | ORAL_CAPSULE | Freq: Three times a day (TID) | ORAL | Status: DC
  Administered 2012-01-19 – 2012-01-20 (×4): 100 mg via ORAL
  Filled 2012-01-19 (×6): qty 1

## 2012-01-19 MED ORDER — VANCOMYCIN HCL IN DEXTROSE 1-5 GM/200ML-% IV SOLN
1000.0000 mg | Freq: Once | INTRAVENOUS | Status: AC
  Administered 2012-01-19: 1000 mg via INTRAVENOUS
  Filled 2012-01-19: qty 200

## 2012-01-19 MED ORDER — TRAMADOL HCL 50 MG PO TABS: 50.0000 mg | ORAL_TABLET | Freq: Four times a day (QID) | ORAL | Status: DC | PRN

## 2012-01-19 MED ORDER — GLIMEPIRIDE 4 MG PO TABS
4.0000 mg | ORAL_TABLET | Freq: Every day | ORAL | Status: DC
  Administered 2012-01-19 – 2012-01-20 (×2): 4 mg via ORAL
  Filled 2012-01-19 (×4): qty 1

## 2012-01-19 MED ORDER — SODIUM CHLORIDE 0.9 % IV BOLUS (SEPSIS)
1000.0000 mL | Freq: Once | INTRAVENOUS | Status: AC
  Administered 2012-01-19: 1000 mL via INTRAVENOUS

## 2012-01-19 MED ORDER — NICOTINE 7 MG/24HR TD PT24
7.0000 mg | MEDICATED_PATCH | Freq: Every day | TRANSDERMAL | Status: DC
  Administered 2012-01-19 – 2012-01-20 (×2): 7 mg via TRANSDERMAL
  Filled 2012-01-19 (×2): qty 1

## 2012-01-19 MED ORDER — QUETIAPINE FUMARATE ER 50 MG PO TB24
100.0000 mg | ORAL_TABLET | Freq: Every day | ORAL | Status: DC
  Administered 2012-01-19 – 2012-01-20 (×2): 100 mg via ORAL
  Filled 2012-01-19 (×2): qty 2

## 2012-01-19 MED ORDER — ONDANSETRON HCL 4 MG/2ML IJ SOLN
4.0000 mg | Freq: Once | INTRAMUSCULAR | Status: AC
  Administered 2012-01-19: 4 mg via INTRAVENOUS
  Filled 2012-01-19: qty 2

## 2012-01-19 MED ORDER — LISINOPRIL 20 MG PO TABS
20.0000 mg | ORAL_TABLET | Freq: Every day | ORAL | Status: DC
  Administered 2012-01-19 – 2012-01-20 (×2): 20 mg via ORAL
  Filled 2012-01-19 (×2): qty 1

## 2012-01-19 MED ORDER — TRAZODONE HCL 100 MG PO TABS
100.0000 mg | ORAL_TABLET | Freq: Every day | ORAL | Status: DC
  Filled 2012-01-19: qty 1

## 2012-01-19 MED ORDER — DIPHENHYDRAMINE HCL 50 MG/ML IJ SOLN: 25.0000 mg | Freq: Four times a day (QID) | INTRAMUSCULAR | Status: DC | PRN

## 2012-01-19 MED ORDER — CITALOPRAM HYDROBROMIDE 10 MG PO TABS
10.0000 mg | ORAL_TABLET | Freq: Every day | ORAL | Status: DC
  Administered 2012-01-19: 10 mg via ORAL
  Filled 2012-01-19: qty 1

## 2012-01-19 MED ORDER — MORPHINE SULFATE 2 MG/ML IJ SOLN
2.0000 mg | Freq: Once | INTRAMUSCULAR | Status: AC
  Administered 2012-01-19: 2 mg via INTRAVENOUS
  Filled 2012-01-19: qty 1

## 2012-01-19 MED ORDER — SODIUM CHLORIDE 0.9 % IV SOLN
INTRAVENOUS | Status: DC
  Administered 2012-01-19: 10:00:00 via INTRAVENOUS

## 2012-01-19 MED ORDER — MELOXICAM 7.5 MG PO TABS
7.5000 mg | ORAL_TABLET | Freq: Every day | ORAL | Status: DC
  Administered 2012-01-19 – 2012-01-20 (×2): 7.5 mg via ORAL
  Filled 2012-01-19 (×2): qty 1

## 2012-01-19 MED ORDER — ENOXAPARIN SODIUM 40 MG/0.4ML ~~LOC~~ SOLN
40.0000 mg | SUBCUTANEOUS | Status: DC
  Administered 2012-01-19 – 2012-01-20 (×2): 40 mg via SUBCUTANEOUS
  Filled 2012-01-19 (×2): qty 0.4

## 2012-01-19 MED ORDER — INSULIN ASPART 100 UNIT/ML ~~LOC~~ SOLN
0.0000 [IU] | Freq: Three times a day (TID) | SUBCUTANEOUS | Status: DC
  Administered 2012-01-19: 15 [IU] via SUBCUTANEOUS
  Administered 2012-01-20: 3 [IU] via SUBCUTANEOUS
  Administered 2012-01-20: 5 [IU] via SUBCUTANEOUS

## 2012-01-19 MED ORDER — MORPHINE SULFATE 2 MG/ML IJ SOLN: 1.0000 mg | INTRAMUSCULAR | Status: DC | PRN

## 2012-01-19 MED ORDER — FENOFIBRATE 160 MG PO TABS
160.0000 mg | ORAL_TABLET | Freq: Every day | ORAL | Status: DC
  Administered 2012-01-19 – 2012-01-20 (×2): 160 mg via ORAL
  Filled 2012-01-19 (×2): qty 1

## 2012-01-19 MED ORDER — VANCOMYCIN HCL IN DEXTROSE 1-5 GM/200ML-% IV SOLN
1000.0000 mg | Freq: Three times a day (TID) | INTRAVENOUS | Status: DC
  Administered 2012-01-19 – 2012-01-20 (×3): 1000 mg via INTRAVENOUS
  Filled 2012-01-19 (×5): qty 200

## 2012-01-19 MED ORDER — DIPHENHYDRAMINE HCL 50 MG/ML IJ SOLN
INTRAMUSCULAR | Status: AC
  Administered 2012-01-19: 25 mg via INTRAVENOUS
  Filled 2012-01-19: qty 1

## 2012-01-19 MED ORDER — BUSPIRONE HCL 10 MG PO TABS
10.0000 mg | ORAL_TABLET | Freq: Three times a day (TID) | ORAL | Status: DC
  Administered 2012-01-19 – 2012-01-20 (×3): 10 mg via ORAL
  Filled 2012-01-19 (×5): qty 1

## 2012-01-19 MED ORDER — DIPHENHYDRAMINE HCL 50 MG/ML IJ SOLN
25.0000 mg | Freq: Once | INTRAMUSCULAR | Status: AC
  Administered 2012-01-19: 25 mg via INTRAVENOUS

## 2012-01-19 MED ORDER — PIPERACILLIN-TAZOBACTAM 3.375 G IVPB
3.3750 g | Freq: Three times a day (TID) | INTRAVENOUS | Status: DC
  Administered 2012-01-19 – 2012-01-20 (×3): 3.375 g via INTRAVENOUS
  Filled 2012-01-19 (×4): qty 50

## 2012-01-19 MED ORDER — MAGNESIUM SULFATE 40 MG/ML IJ SOLN
2.0000 g | Freq: Once | INTRAMUSCULAR | Status: AC
  Administered 2012-01-19: 2 g via INTRAVENOUS
  Filled 2012-01-19: qty 50

## 2012-01-19 MED ORDER — HYDROCODONE-ACETAMINOPHEN 5-325 MG PO TABS
1.0000 | ORAL_TABLET | ORAL | Status: DC | PRN
  Administered 2012-01-19 – 2012-01-20 (×4): 2 via ORAL
  Filled 2012-01-19 (×4): qty 2

## 2012-01-19 MED ORDER — INSULIN ASPART 100 UNIT/ML ~~LOC~~ SOLN: 0.0000 [IU] | Freq: Every day | SUBCUTANEOUS | Status: DC

## 2012-01-19 MED ORDER — TRAZODONE HCL 50 MG PO TABS
150.0000 mg | ORAL_TABLET | Freq: Every day | ORAL | Status: DC
  Administered 2012-01-19: 150 mg via ORAL
  Filled 2012-01-19 (×2): qty 3

## 2012-01-19 NOTE — ED Provider Notes (Signed)
History     CSN: [id number]  Arrival date & time 01/19/12  0206   First MD Initiated Contact with Patient 01/19/12 0308      Chief Complaint  Patient presents with  . Abscess   HPI  History provided by the patient. Patient is a 56 year old male with history of hypertension, diabetes who presents with complaints of left upper arm skin infection. Patient reports having pain, redness and swelling to his left upper arm over the biceps area for the past 10 days. Patient initial states that he was stuck with a chicken wire and arm 10 days ago. Patient was evaluated in urgent care Center for this and placed on Bactrim. Patient has been taking this without any significant improvements. On later questioning patient did admit to intravenous IV drug use the same arm prior to symptoms. Patient denies any erythematous streaks. He denies any fever, chills or sweats. He denies having similar symptoms previously.    Past Medical History  Diagnosis Date  . HTN (hypertension) 09/25/2011  . DM (diabetes mellitus) 09/25/2011  . Degenerative joint disease 09/25/2011  . Depression 09/25/2011  . Anxiety 09/25/2011  . Chronic pain syndrome 09/25/2011  . Basal cell cancer 09/25/2011    Mid back 2007  . Melanoma 09/25/2011    Right thigh 2005    Past Surgical History  Procedure Date  . Tumor removal from right thigh   . Lymph node removal     Family History  Problem Relation Age of Onset  . Aneurysm Mother   . Cancer Father     History  Substance Use Topics  . Smoking status: Current Everyday Smoker    Types: Cigarettes  . Smokeless tobacco: Not on file  . Alcohol Use: Yes     seldom      Review of Systems  Constitutional: Negative for fever and chills.  Gastrointestinal: Negative for nausea and vomiting.  Skin:       Redness and swelling to left upper arm  All other systems reviewed and are negative.    Allergies  Review of patient's allergies indicates no known allergies.  Home  Medications   Current Outpatient Rx  Name Route Sig Dispense Refill  . CITALOPRAM HYDROBROMIDE 10 MG PO TABS Oral Take 10 mg by mouth daily.    . FENOFIBRATE 120 MG PO TABS Oral Take 1 tablet by mouth daily.    Marland Kitchen GABAPENTIN 100 MG PO CAPS Oral Take 100 mg by mouth 3 (three) times daily.    Marland Kitchen GLIMEPIRIDE 4 MG PO TABS Oral Take 4 mg by mouth daily before breakfast.    . HYDROCODONE-ACETAMINOPHEN 5-500 MG PO TABS Oral Take 1 tablet by mouth every 6 (six) hours as needed.    . INSULIN GLARGINE 100 UNIT/ML [place] SOLN Subcutaneous Inject into the skin at bedtime.    Marland Kitchen LISINOPRIL 20 MG PO TABS Oral Take 20 mg by mouth daily.    . MELOXICAM 7.5 MG PO TABS Oral Take 7.5 mg by mouth daily.    . TRAMADOL HCL 50 MG PO TABS Oral Take 50 mg by mouth every 6 (six) hours as needed.    . TRAZODONE HCL 100 MG PO TABS Oral Take 100 mg by mouth at bedtime.      BP 128/75  Pulse 73  Temp 98.4 F (36.9 C) (Oral)  Resp 16  Ht 5\' 10"  (1.778 m)  Wt 176 lb 3.2 oz (79.924 kg)  BMI 25.28 kg/m2  SpO2 97%  Physical Exam  Nursing note and vitals reviewed. Constitutional: He is oriented to person, place, and time. He appears well-developed and well-nourished. No distress.  HENT:  Head: Normocephalic.  Cardiovascular: Normal rate and regular rhythm.   Pulmonary/Chest: Effort normal and breath sounds normal.  Musculoskeletal:       3-4 cm erythematous nodular area with induration to the anterior left upper arm. No erythematous streaks. No bleeding or drainage.  Neurological: He is alert and oriented to person, place, and time.  Skin: Skin is warm.  Psychiatric: He has a normal mood and affect. His behavior is normal.    ED Course  Procedures    INCISION AND DRAINAGE Performed by: Angus Seller Consent: Verbal consent obtained. Risks and benefits: risks, benefits and alternatives were discussed Type: abscess  Body area: Left upper  Anesthesia: local infiltration  Local anesthetic: lidocaine 2% with  epinephrine  Anesthetic total: 1 ml  Complexity: complex Blunt dissection to break up loculations  Drainage: purulent  Drainage amount: Large   Packing material: 1/4 in iodoform gauze  Patient tolerance: Patient tolerated the procedure well with no immediate complications.       Results for orders placed during the hospital encounter of 01/19/12  GLUCOSE, CAPILLARY      Component Value Range   Glucose-Capillary 221 (*) 70 - 99 mg/dL  CBC WITH DIFFERENTIAL      Component Value Range   WBC 18.9 (*) 4.0 - 10.5 K/uL   RBC 4.64  4.22 - 5.81 MIL/uL   Hemoglobin 14.5  13.0 - 17.0 g/dL   HCT 95.6  21.3 - 08.6 %   MCV 87.3  78.0 - 100.0 fL   MCH 31.3  26.0 - 34.0 pg   MCHC 35.8  30.0 - 36.0 g/dL   RDW 57.8  46.9 - 62.9 %   Platelets 330  150 - 400 K/uL   Neutrophils Relative 67  43 - 77 %   Neutro Abs 12.7 (*) 1.7 - 7.7 K/uL   Lymphocytes Relative 20  12 - 46 %   Lymphs Abs 3.7  0.7 - 4.0 K/uL   Monocytes Relative 11  3 - 12 %   Monocytes Absolute 2.0 (*) 0.1 - 1.0 K/uL   Eosinophils Relative 2  0 - 5 %   Eosinophils Absolute 0.4  0.0 - 0.7 K/uL   Basophils Relative 0  0 - 1 %   Basophils Absolute 0.1  0.0 - 0.1 K/uL  BASIC METABOLIC PANEL      Component Value Range   Sodium 132 (*) 135 - 145 mEq/L   Potassium 4.1  3.5 - 5.1 mEq/L   Chloride 97  96 - 112 mEq/L   CO2 23  19 - 32 mEq/L   Glucose, Bld 267 (*) 70 - 99 mg/dL   BUN 21  6 - 23 mg/dL   Creatinine, Ser 5.28  0.50 - 1.35 mg/dL   Calcium 9.0  8.4 - 41.3 mg/dL   GFR calc non Af Amer >90  >90 mL/min   GFR calc Af Amer >90  >90 mL/min  CBC      Component Value Range   WBC 18.9 (*) 4.0 - 10.5 K/uL   RBC 4.54  4.22 - 5.81 MIL/uL   Hemoglobin 14.1  13.0 - 17.0 g/dL   HCT 24.4  01.0 - 27.2 %   MCV 87.9  78.0 - 100.0 fL   MCH 31.1  26.0 - 34.0 pg   MCHC 35.3  30.0 - 36.0 g/dL  RDW 12.2  11.5 - 15.5 %   Platelets 323  150 - 400 K/uL  COMPREHENSIVE METABOLIC PANEL      Component Value Range   Sodium 131 (*)  135 - 145 mEq/L   Potassium 4.1  3.5 - 5.1 mEq/L   Chloride 98  96 - 112 mEq/L   CO2 22  19 - 32 mEq/L   Glucose, Bld 253 (*) 70 - 99 mg/dL   BUN 19  6 - 23 mg/dL   Creatinine, Ser 4.09  0.50 - 1.35 mg/dL   Calcium 8.6  8.4 - 81.1 mg/dL   Total Protein 6.2  6.0 - 8.3 g/dL   Albumin 3.1 (*) 3.5 - 5.2 g/dL   AST 25  0 - 37 U/L   ALT 39  0 - 53 U/L   Alkaline Phosphatase 60  39 - 117 U/L   Total Bilirubin 0.3  0.3 - 1.2 mg/dL   GFR calc non Af Amer >90  >90 mL/min   GFR calc Af Amer >90  >90 mL/min  MAGNESIUM      Component Value Range   Magnesium 1.4 (*) 1.5 - 2.5 mg/dL  PHOSPHORUS      Component Value Range   Phosphorus 2.7  2.3 - 4.6 mg/dL  DIFFERENTIAL      Component Value Range   Neutrophils Relative 68  43 - 77 %   Neutro Abs 12.8 (*) 1.7 - 7.7 K/uL   Lymphocytes Relative 19  12 - 46 %   Lymphs Abs 3.7  0.7 - 4.0 K/uL   Monocytes Relative 10  3 - 12 %   Monocytes Absolute 1.8 (*) 0.1 - 1.0 K/uL   Eosinophils Relative 3  0 - 5 %   Eosinophils Absolute 0.6  0.0 - 0.7 K/uL   Basophils Relative 0  0 - 1 %   Basophils Absolute 0.1  0.0 - 0.1 K/uL  APTT      Component Value Range   aPTT 31  24 - 37 seconds  PROTIME-INR      Component Value Range   Prothrombin Time 13.8  11.6 - 15.2 seconds   INR 1.04  0.00 - 1.49  TSH      Component Value Range   TSH 1.205  0.350 - 4.500 uIU/mL  URINE RAPID DRUG SCREEN (HOSP PERFORMED)      Component Value Range   Opiates NONE DETECTED  NONE DETECTED   Cocaine POSITIVE (*) NONE DETECTED   Benzodiazepines NONE DETECTED  NONE DETECTED   Amphetamines NONE DETECTED  NONE DETECTED   Tetrahydrocannabinol NONE DETECTED  NONE DETECTED   Barbiturates NONE DETECTED  NONE DETECTED  ETHANOL      Component Value Range   Alcohol, Ethyl (B) <11  0 - 11 mg/dL  GLUCOSE, CAPILLARY      Component Value Range   Glucose-Capillary 210 (*) 70 - 99 mg/dL   Comment 1 Notify RN     Comment 2 Documented in Chart    GLUCOSE, CAPILLARY      Component  Value Range   Glucose-Capillary 416 (*) 70 - 99 mg/dL  GLUCOSE, CAPILLARY      Component Value Range   Glucose-Capillary 153 (*) 70 - 99 mg/dL   Comment 1 Documented in Chart     Comment 2 Notify RN          1. 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 HTN (hypertension)  4. Suicidal ideation   5. Anxiety   6. Depression       MDM  4:00 AM patient seen and evaluated. Patient does admit to IV drug use the left upper arm same location of abscess infection.  Patient was also seen and discussed with attending physician. Will I&D abscess.  Spoke with [facility] hospital[facility]. They will admit for continued observation and antibiotic treatment of abscess infection.     Angus Seller, [place] 01/20/12 [phone]

## 2012-01-19 NOTE — Consult Note (Signed)
Patient Identification:  Charles Manning Date of Evaluation:  01/19/2012 Reason for Consult: suicidal Ideation  Referring Provider: Dr. Elisabeth Pigeon History of Present Illness:Pt states in 'confidentiality' that he has been suicidal and has thought of just ending his life.  He became extremely agitated when he was told he will have a 'sitter'.  He was upon admission found to have an abcess in his left upper arm, above elbow.  He said he was shooting cocaine in his arm.  Past Psychiatric History:He takes Celexa  Not specific info provided    Past Medical History:     Past Medical History  Diagnosis Date  . HTN (hypertension) 09/25/2011  . DM (diabetes mellitus) 09/25/2011  . Degenerative joint disease 09/25/2011  . Depression 09/25/2011  . Anxiety 09/25/2011  . Chronic pain syndrome 09/25/2011  . Basal cell cancer 09/25/2011    Mid back 2007  . Melanoma 09/25/2011    Right thigh 2005       Past Surgical History  Procedure Date  . Tumor removal from right thigh   . Lymph node removal     Allergies:  Allergies  Allergen Reactions  . Vancomycin Itching    Current Medications:  Prior to Admission medications   Medication Sig Start Date End Date Taking? Authorizing Provider  citalopram (CELEXA) 20 MG tablet Take 20 mg by mouth daily.   Yes Historical Provider, MD  fenofibrate 54 MG tablet Take 54 mg by mouth daily.   Yes Historical Provider, MD  gabapentin (NEURONTIN) 800 MG tablet Take 800 mg by mouth See admin instructions. 2 to 3 times a day   Yes Historical Provider, MD  glimepiride (AMARYL) 4 MG tablet Take 4 mg by mouth daily before breakfast.   Yes Historical Provider, MD  HYDROcodone-acetaminophen (NORCO/VICODIN) 5-325 MG per tablet Take 1 tablet by mouth See admin instructions. Takes once to twice a day   Yes Historical Provider, MD  insulin glargine (LANTUS) 100 UNIT/ML injection Inject 40 Units into the skin at bedtime.   Yes Historical Provider, MD  lisinopril (PRINIVIL,ZESTRIL)  10 MG tablet Take 10 mg by mouth daily.   Yes Historical Provider, MD  meloxicam (MOBIC) 15 MG tablet Take 15 mg by mouth daily.   Yes Historical Provider, MD  traMADol (ULTRAM) 50 MG tablet Take 50 mg by mouth See admin instructions. Once to twice as needed for pain   Yes Historical Provider, MD  traZODone (DESYREL) 50 MG tablet Take 50 mg by mouth at bedtime.   Yes Historical Provider, MD    Social History:    reports that he has been smoking Cigarettes.  He has been smoking about .5 packs per day. He has never used smokeless tobacco. He reports that he drinks alcohol. He reports that he does not use illicit drugs.   Family History:    Family History  Problem Relation Age of Onset  . Aneurysm Mother   . Cancer Father     Mental Status Examination/Evaluation: Objective:  Appearance: Disheveled and agitated, unable to relax  Psychomotor Activity:  Increased and Restlessness  Eye Contact::  Good  Speech:  Clear and Coherent and Pressured  Volume:  Increased  Mood:  Anxious, Dysphoric and Irritable  Affect:  Inappropriate  Thought Process:  Coherent, Relevant, Circumstantial and Intact  Orientation:  Full  Thought Content:  Suicidal ideation  Suicidal Thoughts:  Yes.  with intent/plan  Homicidal Thoughts:  No  Judgement:  Impaired  Insight:  Lacking    DIAGNOSIS:  AXIS I   Bipolar Disorder II  Hypomanic depressed Mixed state, Tobacco Dependence Cocaine abuse  AXIS II  Deffered  AXIS III See medical notes.  AXIS IV economic problems, other psychosocial or environmental problems, problems related to legal system/crime, problems related to social environment and owes community service, legal fees due to poor judgment  AXIS V 51-60 moderate symptoms   Assessment/Plan: Discussed with Dr. Elisabeth Pigeon, best friend in room [name] Pt is agitated with pressured, rapid speech.  He is agitated that he has not had enough sleep, procedures and process keeps following one after the other and is  annoyed that he has to talk to someone else.  His friend is in the room.  [name] [retired social worker] says she has seen him for the past two days.  She feels exhausted trying to keep him calm down.  He is agitated about his home life.  He says he has played with Cocaine but it is too expensive.  He use drugs in younger years but denies cannabis , other drugs now. He minimizes drinking beer; no liquor.   He admits he tried to shoot up Cocaine because his doctor gave him syringes for DMII medication. This injection site became an abscessd.  He says he does not take his medication regularly; nor does he take cbgs routinely.  He has a legal problem for dumping neighbor's garbage in their driveway.  His dump truck was impounded and he had to go to court and received community service sentence.  He lives alone.  NB:  His friend [name] says she is exhausted trying to manage his behavior.  He has been crying for two days and says that he has locked his gun in the attic so he won't be able to use it.  He decries his decision to bother his neighbor's yard for the trouble it caused him both for the money they did not pay him and the community service.  RECOMMENDATION:  1.  Pt has capacity but has passive suicidal thoughts being both hypomanic and depressed. 2.  Suggest no sitter is needed at this time. 3.  Suggest DC Celexa, citalopram  10mg  - incompatible with Tramadol for serotonin syndrome potential 4. Begin Seroquel XR 150 mg at 8 pm  NOTE:  No EKG info available to ck QTc 5.  Use Cogentin, benztropine, 0.5 mg oral 2X daily prn EPS, TD 6. Increase trazodone 150 mg for sleep 7.  Suggest Buspar 10 mg 3 X daily for anxiety 8.  Suggest Nicotine Patch 7 mg Daily.  Option per MD consider pt's electronic cigarette if approved.  9.  Request Psych CSW to discuss [facility] or comparable facility to stabilize mood or if refused, consider community outpatient mental health service.  Karie Skowron J. Ferol Luz, MD Psychiatrist  01/19/2012  3:25 PM

## 2012-01-19 NOTE — Progress Notes (Signed)
Psych MD asked [facility]  Spoke with Pt and Pt's friend, [name], regarding [facility] admission.  Pt adamant that he doesn't need inpt tx for mental health now.  He states that he has other social pxs to tend to, such as community service.  Pt states that he may be interested in inpt tx in several months.    Pt denies current SI.  Pt states that he is frustrated at the situations in his life but that he wants to live.  Pt willing to receive information on [facility], however he states that he doesn't have time to see a therapist, nor does he want to go "anywhere else" for anything.  Pt's friend, [name], voiced concern re: Pt's lack of willingness to receive mental health tx.  [name] stated, though, that she understood that this couldn't be forced upon Pt, at this time.  Provided Pt with information on [facility].  Discussed SA.  Pt states that he was clean for 20+ years until his brother moved in with him.  He relapsed due to his brother's presence and has since kicked his brother out.  Pt states that he will be submitting to random drug screens and states that he will abstain due to being subjected to the legal system.  No additional psych CSW needs identified.  CSW to sign off.  Providence Crosby, LCSWA Clinical Social Work [phone]

## 2012-01-19 NOTE — ED Notes (Signed)
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

## 2012-01-19 NOTE — Progress Notes (Signed)
56 yr with left arm abcess, hx of iv drug use  Needs abx Had I and D in the ED

## 2012-01-19 NOTE — Progress Notes (Addendum)
ANTIBIOTIC CONSULT NOTE   Pharmacy Consult for Vancomycin, Zosyn Indication: abscess  Allergies  Allergen Reactions  . Vancomycin Itching    Patient Measurements: Height: 5\' 10"  (177.8 cm) Weight: 176 lb 3.2 oz (79.924 kg) IBW/kg (Calculated) : 73   Vital Signs: Temp: 98 F (36.7 C) (08/13 0703) Temp src: Oral (08/13 0703) BP: 131/91 mmHg (08/13 0703) Pulse Rate: 64  (08/13 0703)  Labs:  Basename 01/19/12 0815 01/19/12 0440  WBC 18.9* 18.9*  HGB 14.1 14.5  PLT 323 330  LABCREA -- --  CREATININE -- 0.89   Estimated Creatinine Clearance: 98 ml/min (by C-G formula based on Cr of 0.89). No results found for this basename: VANCOTROUGH:2,VANCOPEAK:2,VANCORANDOM:2,GENTTROUGH:2,GENTPEAK:2,GENTRANDOM:2,TOBRATROUGH:2,TOBRAPEAK:2,TOBRARND:2,AMIKACINPEAK:2,AMIKACINTROU:2,AMIKACIN:2, in the last 72 hours   Microbiology: No results found for this or any previous visit (from the past 720 hour(s)).  Medical History: Past Medical History  Diagnosis Date  . HTN (hypertension) 09/25/2011  . DM (diabetes mellitus) 09/25/2011  . Degenerative joint disease 09/25/2011  . Depression 09/25/2011  . Anxiety 09/25/2011  . Chronic pain syndrome 09/25/2011  . Basal cell cancer 09/25/2011    Mid back 2007  . Melanoma 09/25/2011    Right thigh 2005    Medications:  Anti-infectives     Start     Dose/Rate Route Frequency Ordered Stop   01/19/12 0500   vancomycin (VANCOCIN) IVPB 1000 mg/200 mL premix        1,000 mg 200 mL/hr over 60 Minutes Intravenous  Once 01/19/12 0458 01/19/12 0610         Assessment:  56 yo M admit with left arm abscess, history of IV drug use.  S/p I&D in ED  Renal function appears normal, CrCl ~ 98 ml/min.  Noted Vancomycin allergy (itching) added today; discussed slowing IV rate and prn Benadryl with RN and MD.  Goal of Therapy:  Vancomycin trough level 15-20 mcg/ml  Plan:  Zosyn 3.375g IV Q8H infused over 4hrs.  Vancomycin 1g IV q8h.  Measure Vanc  trough at steady state.  Follow up renal fxn and culture results.   Lynann Beaver PharmD, BCPS Pager [phone] 01/19/2012 8:59 AM

## 2012-01-19 NOTE — Progress Notes (Signed)
Inpatient Diabetes Program Recommendations  AACE/ADA: New Consensus Statement on Inpatient Glycemic Control (2013)  Target Ranges:  Prepandial:   less than 140 mg/dL      Peak postprandial:   less than 180 mg/dL (1-2 hours)      Critically ill patients:  140 - 180 mg/dL   Reason for Visit: Hyperglycemia  yr with left arm abcess, hx of iv drug use, had I & D in ED.  States he has meter at home but doesn't check blood sugars daily. Takes Lantus 40 units QHS.  Sees Dr. Jake Shark for diabetes management. Results for LAYMAN, GULLY (MRN [id number]) as of 01/19/2012 12:48  Ref. Range 01/19/2012 03:55  Glucose-Capillary Latest Range: 70-99 mg/dL 409 (H)  Results for NAKOA, GANUS (MRN [id number]) as of 01/19/2012 12:48  Ref. Range 01/19/2012 08:15  Sodium Latest Range: 135-145 mEq/L 131 (L)  Potassium Latest Range: 3.5-5.1 mEq/L 4.1  Chloride Latest Range: 96-112 mEq/L 98  CO2 Latest Range: 19-32 mEq/L 22  BUN Latest Range: 6-23 mg/dL 19  Creat Latest Range: 0.50-1.35 mg/dL 9.56  Calcium Latest Range: 8.4-10.5 mg/dL 8.6  GFR calc non Af Amer Latest Range: >90 mL/min >90  GFR calc Af Amer Latest Range: >90 mL/min >90  Glucose Latest Range: 70-99 mg/dL 213 (H)    Recommendations:  Check HgbA1C to assess glycemic control prior to hospitalization. Add Lantus 40 units QHS Add meal coverage insulin - Novolog 4 units tidwc. Increase correction insulin to Novolog resistant tidwc and hs.  Will continue to follow.

## 2012-01-19 NOTE — H&P (Addendum)
[facility] History and Physical  Charles Manning QMV:[id number] DOB: [birth year] DOA: 01/19/2012  Referring physician: ER physician PCP: No primary provider on file.   Chief Complaint: abscess in left upper extremity  HPI:  56 year old male with history of uncontrolled diabetes, current drug abuse (cocaine), HTN who presented with left upper extremity abscess. Patient noticed this abscess about 5 days ago but reports he injected cocaine in this area with diabetic syringe. Patient also reported that this area (now seems like an abscess) initially started as a lump and it was painful to touch. Over a course of few days it grew in size to be about 1.5 cm in diameter but was never draining pus until after incision and drainage done in ED.  No fever, no chills, no abdominal pain, no nausea or vomiting. No chest pain, no shortness of breath and no cough. Patient reports feeling depressed lately, suicidal ideations but no actual suicide attempt. Patient reports that he has been thinking about ways to commit suicide. Patient does have history of depression but reports not having similar ideations in the past.  Assessment and Plan:  Principal Problem: Left arm abscess - At the site of IV drug q. - Status post I&D in ED - Will broaden the antibiotic regimen to add zosyn; continue vancomycin  Active Problems: Diabetes mellitus, uncontrolled with complications - We will continue home indications which includes Amaryl - Start sliding scale insulin and CBG monitoring - Check A1c  Suicidal ideations, depression - Appreciate psychiatry consultation - Provide one-to-one observation - Continue Celexa   Leukocytosis - Secondary to left arm abscess - Continue vancomycin and Zosyn - Followup CBC in the morning  Hypomagnesemia - Unclear etiology - Repleted with magnesium sulfate 2 g x1 dose IV - Follow up magnesium level in the morning  Substance abuse - Check UDS and alcohol level - Social  work consult for current substance abuse - Counseling provided by me on substance abuse  Hypertension - Continue lisinopril 10 mg daily  DVT prophylaxis - Lovenox subcutaneous  Diet - Carb modified  Code Status: Full Family Communication: Pt at bedside Disposition Plan: Admit for further evaluation  Manson Passey, MD  [facility] Pager [phone]  If 7PM-7AM, please contact night-coverage [email or web address] Password [facility] 01/19/2012, 12:26 PM  Consults  Psychiatry  Social work  Antibiotics  Vancomycin 01/19/2012-->  Zosyn 01/19/2012 -->  Procedures   Incision and drainage of the left arm abscess 01/19/2012 done in ED     Review of Systems:  Constitutional: Negative for fever, chills and malaise/fatigue. Negative for diaphoresis.  HENT: Negative for hearing loss, ear pain, nosebleeds, congestion, sore throat, neck pain, tinnitus and ear discharge.   Eyes: Negative for blurred vision, double vision, photophobia, pain, discharge and redness.  Respiratory: Negative for cough, hemoptysis, sputum production, shortness of breath, wheezing and stridor.   Cardiovascular: Negative for chest pain, palpitations, orthopnea, claudication and leg swelling.  Gastrointestinal: Negative for nausea, vomiting and abdominal pain. Negative for heartburn, constipation, blood in stool and melena.  Genitourinary: Negative for dysuria, urgency, frequency, hematuria and flank pain.  Musculoskeletal: Negative for myalgias, back pain, joint pain and falls.  Skin: per HPI  Neurological: Negative for dizziness and weakness. Negative for tingling, tremors, sensory change, speech change, focal weakness, loss of consciousness and headaches.  Endo/Heme/Allergies: Negative for environmental allergies and polydipsia. Does not bruise/bleed easily.  Psychiatric/Behavioral: positive for suicidal ideations, depression    Past Medical History  Diagnosis Date  . HTN (hypertension)  09/25/2011    . DM (diabetes mellitus) 09/25/2011  . Degenerative joint disease 09/25/2011  . Depression 09/25/2011  . Anxiety 09/25/2011  . Chronic pain syndrome 09/25/2011  . Basal cell cancer 09/25/2011    Mid back 2007  . Melanoma 09/25/2011    Right thigh 2005   Past Surgical History  Procedure Date  . Tumor removal from right thigh   . Lymph node removal    Social History:  reports that he has been smoking Cigarettes.  He has been smoking about .5 packs per day. He has never used smokeless tobacco. He reports that he drinks alcohol. He reports that he does not use illicit drugs.  Allergies  Allergen Reactions  . Vancomycin Itching    Family History: HTN and Diabetes in family  Prior to Admission medications   Medication Sig Start Date End Date Taking? Authorizing Provider  citalopram (CELEXA) 20 MG tablet Take 20 mg by mouth daily.   Yes Historical Provider, MD  fenofibrate 54 MG tablet Take 54 mg by mouth daily.   Yes Historical Provider, MD  gabapentin (NEURONTIN) 800 MG tablet Take 800 mg by mouth See admin instructions. 2 to 3 times a day   Yes Historical Provider, MD  glimepiride (AMARYL) 4 MG tablet Take 4 mg by mouth daily before breakfast.   Yes Historical Provider, MD  HYDROcodone-acetaminophen (NORCO/VICODIN) 5-325 MG per tablet Take 1 tablet by mouth See admin instructions. Takes once to twice a day   Yes Historical Provider, MD  insulin glargine (LANTUS) 100 UNIT/ML injection Inject 40 Units into the skin at bedtime.   Yes Historical Provider, MD  lisinopril (PRINIVIL,ZESTRIL) 10 MG tablet Take 10 mg by mouth daily.   Yes Historical Provider, MD  meloxicam (MOBIC) 15 MG tablet Take 15 mg by mouth daily.   Yes Historical Provider, MD  traMADol (ULTRAM) 50 MG tablet Take 50 mg by mouth See admin instructions. Once to twice as needed for pain   Yes Historical Provider, MD  traZODone (DESYREL) 50 MG tablet Take 50 mg by mouth at bedtime.   Yes Historical Provider, MD   Physical  Exam: Filed Vitals:   01/19/12 0216 01/19/12 0638 01/19/12 0703  BP: 128/75 132/81 131/91  Pulse: 73 60 64  Temp: 98.4 F (36.9 C) 98.6 F (37 C) 98 F (36.7 C)  TempSrc: Oral Oral Oral  Resp: 16 18 18   Height: 5\' 10"  (1.778 m)  5\' 10"  (1.778 m)  Weight: 79.924 kg (176 lb 3.2 oz)    SpO2: 97% 97% 99%    Physical Exam  Constitutional: Appears well-developed and well-nourished. No distress.  HENT: Normocephalic. External right and left ear normal. Oropharynx is clear and moist.  Eyes: Conjunctivae and EOM are normal. PERRLA, no scleral icterus.  Neck: Normal ROM. Neck supple. No JVD. No tracheal deviation. No thyromegaly.  CVS: RRR, S1/S2 +, no murmurs, no gallops, no carotid bruit.  Pulmonary: Effort and breath sounds normal, no stridor, rhonchi, wheezes, rales.  Abdominal: Soft. BS +,  no distension, tenderness, rebound or guarding.  Musculoskeletal: Normal range of motion. No edema and no tenderness.  Lymphadenopathy: No lymphadenopathy noted, cervical, inguinal. Neuro: Alert. Normal reflexes, muscle tone coordination. No cranial nerve deficit. Skin: left arm small abscess about 1.5 cm in diameter, no active drainage.  Psychiatric: Normal mood and affect.  Labs on Admission:  Basic Metabolic Panel:  Lab 01/19/12 1610 01/19/12 0440  NA 131* 132*  K 4.1 4.1  CL 98 97  CO2  22 23  GLUCOSE 253* 267*  BUN 19 21  CREATININE 0.90 0.89  CALCIUM 8.6 9.0  MG 1.4* --  PHOS 2.7 --   Liver Function Tests:  Lab 01/19/12 0815  AST 25  ALT 39  ALKPHOS 60  BILITOT 0.3  PROT 6.2  ALBUMIN 3.1*   CBC:  Lab 01/19/12 0815 01/19/12 0440  WBC 18.9* 18.9*  HGB 14.1 14.5  HCT 39.9 40.5  MCV 87.9 87.3  PLT 323 330   CBG:  Lab 01/19/12 0355  GLUCAP 221*    Radiological Exams on Admission: No results found.  EKG: Not doen on admission

## 2012-01-20 DIAGNOSIS — F329 Major depressive disorder, single episode, unspecified: Secondary | ICD-10-CM

## 2012-01-20 DIAGNOSIS — F411 Generalized anxiety disorder: Secondary | ICD-10-CM

## 2012-01-20 DIAGNOSIS — G894 Chronic pain syndrome: Secondary | ICD-10-CM

## 2012-01-20 LAB — COMPREHENSIVE METABOLIC PANEL
ALT: 36 U/L (ref 0–53)
Albumin: 3 g/dL — ABNORMAL LOW (ref 3.5–5.2)
Calcium: 9 mg/dL (ref 8.4–10.5)
GFR calc Af Amer: 86 mL/min — ABNORMAL LOW (ref >90)
Glucose, Bld: 194 mg/dL — ABNORMAL HIGH (ref 70–99)
Sodium: 137 mEq/L (ref 135–145)
Total Protein: 6.1 g/dL (ref 6.0–8.3)

## 2012-01-20 LAB — CBC
HCT: 41.7 % (ref 39.0–52.0)
Hemoglobin: 14.2 g/dL (ref 13.0–17.0)
MCHC: 34.1 g/dL (ref 30.0–36.0)
WBC: 11.5 10*3/uL — ABNORMAL HIGH (ref 4.0–10.5)

## 2012-01-20 LAB — URINE CULTURE: Culture: NO GROWTH

## 2012-01-20 MED ORDER — CITALOPRAM HYDROBROMIDE 20 MG PO TABS: 20.0000 mg | ORAL_TABLET | Freq: Every day | ORAL | Status: DC

## 2012-01-20 MED ORDER — BUSPIRONE HCL 10 MG PO TABS: 10.0000 mg | ORAL_TABLET | Freq: Three times a day (TID) | ORAL | Status: DC

## 2012-01-20 MED ORDER — QUETIAPINE FUMARATE ER 150 MG PO TB24: 150.0000 mg | ORAL_TABLET | Freq: Every day | ORAL | Status: DC

## 2012-01-20 MED ORDER — TRAZODONE HCL 150 MG PO TABS: 150.0000 mg | ORAL_TABLET | Freq: Every day | ORAL | Status: AC

## 2012-01-20 MED ORDER — SULFAMETHOXAZOLE-TRIMETHOPRIM 800-160 MG PO TABS: 1.0000 | ORAL_TABLET | Freq: Two times a day (BID) | ORAL | Status: AC

## 2012-01-20 MED ORDER — FENOFIBRATE 160 MG PO TABS: 160.0000 mg | ORAL_TABLET | Freq: Every day | ORAL | Status: AC

## 2012-01-20 MED ORDER — HYDROCODONE-ACETAMINOPHEN 5-325 MG PO TABS: 1.0000 | ORAL_TABLET | ORAL | Status: DC

## 2012-01-20 MED ORDER — GABAPENTIN 600 MG PO TABS: 600.0000 mg | ORAL_TABLET | Freq: Two times a day (BID) | ORAL | Status: DC

## 2012-01-20 NOTE — ED Provider Notes (Signed)
Medical screening examination/treatment/procedure(s) were conducted as a shared visit with non-physician practitioner(s) and myself.  I personally evaluated the patient during the encounter.  Pt with IVDU, now with abscess at left [facility] overlying vein.  Pt has been on abx outpatient, still with increased swelling and pain.  Drainage completed by PA, concern for serious sequelae given ivdu, to be admitted for iv abx  Olivia Mackie, MD 01/20/12 [id number]

## 2012-01-20 NOTE — Discharge Summary (Signed)
Physician Discharge Summary  Charles Manning ZOX:[id number] DOB: Oct 06, 1955 DOA: 01/19/2012  PCP: No primary provider on file.  Admit date: 01/19/2012 Discharge date: 01/20/2012  Recommendations for Outpatient Follow-up:  1. PCP in 1-2 weeks (Follow Left upper extremity abscess; continue treating chronic conditions. Repeat BMET to follow electrolytes and make sure kidney function stable after use of bactrim)   Discharge Diagnoses:  Principal Problem:  *Abscess Active Problems:  HTN (hypertension)  Diabetes mellitus type 2 with complications, uncontrolled  Depression  Anxiety  Chronic pain syndrome  Suicidal ideation   Discharge Condition: stable and in improved condition; patient will finish antibiotics as prescribed; will follow wound care instructions and arrange follow up with PCP in 1-2 weeks.  Diet recommendation: low carbohydrates and low fat diet  Filed Weights   01/19/12 0216  Weight: 79.924 kg (176 lb 3.2 oz)    History of present illness:  56 year old male with history of uncontrolled diabetes, current drug abuse (cocaine), HTN who presented with left upper extremity abscess. Patient noticed this abscess about 5 days ago but reports he injected cocaine in this area with diabetic syringe. Patient also reported that this area (now seems like an abscess) initially started as a lump and it was painful to touch. Over a course of few days it grew in size to be about 1.5 cm in diameter but was never draining pus until after incision and drainage done in ED. No fever, no chills, no abdominal pain, no nausea or vomiting. No chest pain, no shortness of breath and no cough.  Patient reports feeling depressed lately, suicidal ideations but no actual suicide attempt. Patient reports that he has been thinking about ways to commit suicide. Patient does have history of depression but reports not having similar ideations in the past.  Hospital Course:  1-Left arm abscess  - At the site of  IV drug use.  - Status post I&D in ED; now with open wound and with some erythema around wound. Positive purulent discharge after I' and D.  -Patient wants to go home and currently is not septic and WBC's pretty much back to normal. -will follow wound care rec's and patient will follow with PCP and will be discharge on Bactrim BID to complete antibiotic therapy.  2-Diabetes mellitus, uncontrolled with complications  - We will continue home indications which includes Amaryl and lantus - patient to follow low crab diet and to follow with PCP as an outpatient for further treatment of his diabetes.   3-Suicidal ideations, depression  - Per psychiatry consultation patient has been cleared for discharge and to follow outpatient treatment. -Was started on Buspar, Seroquel and Trazodone  4-Leukocytosis  - Secondary to left arm infection/abscess  - after IV antibiotics and I & D; WBC;s down to 11.  -Will finish antibiotic therapy by mouth as indicated.  5-Hypomagnesemia  - Repleted with magnesium sulfate 2 g x1 dose IV  - Repeated Mg level WNL.  7-Substance abuse  - Counseling provided by me on substance abuse  -Resources for outpatient assistance provided by Social Work.  8-Hypertension  - Continue lisinopril 10 mg daily.  9-Elevated Triglycerides: -Continue Tricor   Procedures:  I & D on his LUE (by ED physician)  Consultations:  Wound care  Psych  Discharge Exam: Filed Vitals:   01/20/12 1435  BP: 99/70  Pulse: 54  Temp: 98.5 F (36.9 C)  Resp: 16   Filed Vitals:   01/19/12 1500 01/19/12 2100 01/20/12 0500 01/20/12 1435  BP: 119/72 93/59 98/55  99/70  Pulse: 59 53 52 54  Temp: 98.3 F (36.8 C) 98.2 F (36.8 C) 98.2 F (36.8 C) 98.5 F (36.9 C)  TempSrc: Oral Oral Oral Oral  Resp: 20 15 15 16   Height:      Weight:      SpO2: 95% 98% 99% 95%    General: NAD; wants to go home. Cardiovascular: S1 and S2; no rubs or gallops Respiratory: CTA Abdomen: Soft;  NT, ND and with positive BS Extremities: No cyanosis or clubbing; LUE with open 5cm round with 1 cm depth wound; erythema around wound and with some purulent discharge. Pain to palpation appreciated  Discharge Instructions  Discharge Orders    Future Orders Please Complete By Expires   Diet - low sodium heart healthy      Discharge instructions      Comments:   -Follow with Family medicine physician tomorrow for further care of your LUE wound /cellulitis -Take medications as prescribed -No alcohol or drugs consumption -Follow with PCP in 7-10 days -Follow a low carbohydrate sand low sodium diet -Keep yourself well hydrated   Change dressing (specify)      Comments:   Dressing change: every 24 hours     Medication List  As of 01/20/2012  6:36 PM   STOP taking these medications         fenofibrate 54 MG tablet      traZODone 50 MG tablet         TAKE these medications         busPIRone 10 MG tablet   Commonly known as: BUSPAR   Take 1 tablet (10 mg total) by mouth 3 (three) times daily.      citalopram 20 MG tablet   Commonly known as: CELEXA   Take 1 tablet (20 mg total) by mouth daily.      fenofibrate 160 MG tablet   Take 1 tablet (160 mg total) by mouth daily.      gabapentin 600 MG tablet   Commonly known as: NEURONTIN   Take 1 tablet (600 mg total) by mouth 2 (two) times daily.      glimepiride 4 MG tablet   Commonly known as: AMARYL   Take 4 mg by mouth daily before breakfast.      HYDROcodone-acetaminophen 5-325 MG per tablet   Commonly known as: NORCO/VICODIN   Take 1 tablet by mouth See admin instructions. Takes once to twice a day      insulin glargine 100 UNIT/ML injection   Commonly known as: LANTUS   Inject 40 Units into the skin at bedtime.      lisinopril 10 MG tablet   Commonly known as: PRINIVIL,ZESTRIL   Take 10 mg by mouth daily.      meloxicam 15 MG tablet   Commonly known as: MOBIC   Take 15 mg by mouth daily.      QUEtiapine  Fumarate 150 MG 24 hr tablet   Commonly known as: SEROQUEL XR   Take 1 tablet (150 mg total) by mouth daily.      sulfamethoxazole-trimethoprim 800-160 MG per tablet   Commonly known as: BACTRIM DS,SEPTRA DS   Take 1 tablet by mouth 2 (two) times daily.      traMADol 50 MG tablet   Commonly known as: ULTRAM   Take 50 mg by mouth See admin instructions. Once to twice as needed for pain      traZODone 150 MG tablet  Commonly known as: DESYREL   Take 1 tablet (150 mg total) by mouth at bedtime.              The results of significant diagnostics from this hospitalization (including imaging, microbiology, ancillary and laboratory) are listed below for reference.    Significant Diagnostic Studies: No results found.  Microbiology: Recent Results (from the past 240 hour(s))  URINE CULTURE     Status: Normal   Collection Time   01/19/12 10:00 AM      Component Value Range Status Comment   Specimen Description URINE, CLEAN CATCH   Final    Special Requests NONE   Final    Culture  Setup Time 01/19/2012 18:22   Final    Colony Count NO GROWTH   Final    Culture NO GROWTH   Final    Report Status 01/20/2012 FINAL   Final      Labs: Basic Metabolic Panel:  Lab 01/20/12 1610 01/19/12 0815 01/19/12 0440  NA 137 131* 132*  K 4.6 4.1 4.1  CL 105 98 97  CO2 24 22 23   GLUCOSE 194* 253* 267*  BUN 19 19 21   CREATININE 1.10 0.90 0.89  CALCIUM 9.0 8.6 9.0  MG 1.7 1.4* --  PHOS -- 2.7 --   Liver Function Tests:  Lab 01/20/12 0450 01/19/12 0815  AST 27 25  ALT 36 39  ALKPHOS 56 60  BILITOT 0.5 0.3  PROT 6.1 6.2  ALBUMIN 3.0* 3.1*   CBC:  Lab 01/20/12 0450 01/19/12 0815 01/19/12 0440  WBC 11.5* 18.9* 18.9*  NEUTROABS -- 12.8* 12.7*  HGB 14.2 14.1 14.5  HCT 41.7 39.9 40.5  MCV 89.7 87.9 87.3  PLT 318 323 330   CBG:  Lab 01/20/12 1125 01/20/12 0801 01/19/12 2205 01/19/12 1815 01/19/12 0850  GLUCAP 246* 181* 153* 416* 210*    Time coordinating discharge: >30   minutes  Signed:  Malaiyah Achorn  [facility] 01/20/2012, 6:36 PM

## 2012-01-20 NOTE — Consult Note (Signed)
WOC consult Note Reason for Consult:Patient seen prior to discharge for assessment and recommendations for ULE wound. Patient has been on systemic antibiotics since admission and will be discharge on oral antibiotics. See H&P for additional details surrounding injury. Wound type:Traumatic with infection Pressure Ulcer POA: No Measurement:.5cm round with 1cm depth Wound HYQ:MVHQ  Drainage (amount, consistency, odor) moderate amount purulent exudate Periwound:erythematous Dressing procedure/placement/frequency:patient to cleanse wound with soap and water, cleaning as tolerated with a soft, clean cloth.  Pat dry.  Cover with clean gauze and secure with tape. Patient is to follow up with the [facility] per Dr. Ozella Almond suggestions/orders. Patient is for discharge today.I will not follow.  Please re-consult if needed. Thanks, Ladona Mow, MSN, RN, [facility], CWOCN [phone])

## 2012-01-20 NOTE — Progress Notes (Signed)
ANTIBIOTIC CONSULT NOTE - FOLLOW UP  Pharmacy Consult for:  Vancomycin and Zosyn Indication:  Left arm abcess  Allergies  Allergen Reactions  . Vancomycin Itching    Patient Measurements: Height: 5\' 10"  (177.8 cm) Weight: 176 lb 3.2 oz (79.924 kg) IBW/kg (Calculated) : 73   Vital Signs: Temp: 98.2 F (36.8 C) (08/14 0500) Temp src: Oral (08/14 0500) BP: 98/55 mmHg (08/14 0500) Pulse Rate: 52  (08/14 0500) Intake/Output from previous day: 08/13 0701 - 08/14 0700 In: 1275 [P.O.:630; I.V.:395; IV Piggyback:250] Out: 2 [Urine:2]    Labs:  [facility] 01/20/12 0450 01/19/12 0815 01/19/12 0440  WBC 11.5* 18.9* 18.9*  HGB 14.2 14.1 14.5  PLT 318 323 330  LABCREA -- -- --  CREATININE 1.10 0.90 0.89   Estimated Creatinine Clearance: 79.3 ml/min (by C-G formula based on Cr of 1.1).  Microbiology: Urine culture pending.  Anti-infectives     Start     Dose/Rate Route Frequency Ordered Stop   01/19/12 1400   vancomycin (VANCOCIN) IVPB 1000 mg/200 mL premix        1,000 mg 200 mL/hr over 60 Minutes Intravenous 3 times per day 01/19/12 0902     01/19/12 1300  piperacillin-tazobactam (ZOSYN) IVPB 3.375 g       3.375 g 12.5 mL/hr over 240 Minutes Intravenous 3 times per day 01/19/12 1247     01/19/12 0500   vancomycin (VANCOCIN) IVPB 1000 mg/200 mL premix        1,000 mg 200 mL/hr over 60 Minutes Intravenous  Once 01/19/12 0458 01/19/12 0610          Assessment:  Assisting with Zosyn and Vancomycin therapy for this 56 year-old male with left arm abscess and history of IV drug use.  Itching occurring with Vancomycin administration; Benadryl added for use as needed.  Serum creatinine slightly increased 0.89 to 1.1 over the last 24 hours.  Goals of Therapy:   Vancomycin trough levels 15-20 mcg/ml  Eradication of infection  Plan:   Continue Vancomycin at current dose 1 gm IV every 8 hours.  Obtain Vancomycin trough level prior to 10 pm dose tonight.  Continue  Zosyn at current dose - 3.375 gm IV every 8 hours.  Check for culture results and renal function changes.  Polo Riley R.Ph. 01/20/2012 11:43 AM

## 2012-03-22 ENCOUNTER — Encounter (HOSPITAL_COMMUNITY): Payer: Self-pay | Admitting: Emergency Medicine

## 2012-03-22 ENCOUNTER — Inpatient Hospital Stay (HOSPITAL_COMMUNITY)
Admission: EM | Admit: 2012-03-22 | Discharge: 2012-03-31 | DRG: 571 | Disposition: A | Discharge status: Home Health | Payer: Medicare Other | Attending: Internal Medicine | Admitting: Internal Medicine

## 2012-03-22 DIAGNOSIS — F3289 Other specified depressive episodes: Secondary | ICD-10-CM | POA: Diagnosis present

## 2012-03-22 DIAGNOSIS — E860 Dehydration: Secondary | ICD-10-CM

## 2012-03-22 DIAGNOSIS — L03114 Cellulitis of left upper limb: Secondary | ICD-10-CM

## 2012-03-22 DIAGNOSIS — Z79899 Other long term (current) drug therapy: Secondary | ICD-10-CM

## 2012-03-22 DIAGNOSIS — F32A Depression, unspecified: Secondary | ICD-10-CM

## 2012-03-22 DIAGNOSIS — IMO0001 Reserved for inherently not codable concepts without codable children: Secondary | ICD-10-CM | POA: Diagnosis present

## 2012-03-22 DIAGNOSIS — F419 Anxiety disorder, unspecified: Secondary | ICD-10-CM

## 2012-03-22 DIAGNOSIS — E1165 Type 2 diabetes mellitus with hyperglycemia: Secondary | ICD-10-CM

## 2012-03-22 DIAGNOSIS — M1990 Unspecified osteoarthritis, unspecified site: Secondary | ICD-10-CM | POA: Diagnosis present

## 2012-03-22 DIAGNOSIS — Z85820 Personal history of malignant melanoma of skin: Secondary | ICD-10-CM

## 2012-03-22 DIAGNOSIS — L0291 Cutaneous abscess, unspecified: Secondary | ICD-10-CM

## 2012-03-22 DIAGNOSIS — L02414 Cutaneous abscess of left upper limb: Secondary | ICD-10-CM

## 2012-03-22 DIAGNOSIS — F17200 Nicotine dependence, unspecified, uncomplicated: Secondary | ICD-10-CM | POA: Diagnosis present

## 2012-03-22 DIAGNOSIS — IMO0002 Reserved for concepts with insufficient information to code with codable children: Principal | ICD-10-CM

## 2012-03-22 DIAGNOSIS — E871 Hypo-osmolality and hyponatremia: Secondary | ICD-10-CM

## 2012-03-22 DIAGNOSIS — F329 Major depressive disorder, single episode, unspecified: Secondary | ICD-10-CM | POA: Diagnosis present

## 2012-03-22 DIAGNOSIS — F411 Generalized anxiety disorder: Secondary | ICD-10-CM | POA: Diagnosis present

## 2012-03-22 DIAGNOSIS — D72829 Elevated white blood cell count, unspecified: Secondary | ICD-10-CM | POA: Diagnosis present

## 2012-03-22 DIAGNOSIS — I10 Essential (primary) hypertension: Secondary | ICD-10-CM

## 2012-03-22 DIAGNOSIS — Z23 Encounter for immunization: Secondary | ICD-10-CM

## 2012-03-22 DIAGNOSIS — Z85828 Personal history of other malignant neoplasm of skin: Secondary | ICD-10-CM

## 2012-03-22 DIAGNOSIS — G894 Chronic pain syndrome: Secondary | ICD-10-CM

## 2012-03-22 DIAGNOSIS — F1990 Other psychoactive substance use, unspecified, uncomplicated: Secondary | ICD-10-CM

## 2012-03-22 DIAGNOSIS — F1910 Other psychoactive substance abuse, uncomplicated: Secondary | ICD-10-CM | POA: Diagnosis present

## 2012-03-22 DIAGNOSIS — Z794 Long term (current) use of insulin: Secondary | ICD-10-CM

## 2012-03-22 LAB — CBC WITH DIFFERENTIAL/PLATELET
Basophils Relative: 0 % (ref 0–1)
Eosinophils Relative: 4 % (ref 0–5)
HCT: 39.5 % (ref 39.0–52.0)
Hemoglobin: 14 g/dL (ref 13.0–17.0)
MCH: 31.1 pg (ref 26.0–34.0)
MCV: 87.8 fL (ref 78.0–100.0)
Monocytes Absolute: 1.5 10*3/uL — ABNORMAL HIGH (ref 0.1–1.0)
Neutro Abs: 12.6 10*3/uL — ABNORMAL HIGH (ref 1.7–7.7)
Neutrophils Relative %: 67 % (ref 43–77)
RBC: 4.5 MIL/uL (ref 4.22–5.81)

## 2012-03-22 LAB — BASIC METABOLIC PANEL
BUN: 24 mg/dL — ABNORMAL HIGH (ref 6–23)
Creatinine, Ser: 0.84 mg/dL (ref 0.50–1.35)
GFR calc non Af Amer: >90 mL/min (ref >90)
Glucose, Bld: 489 mg/dL — ABNORMAL HIGH (ref 70–99)
Potassium: 4.4 mEq/L (ref 3.5–5.1)

## 2012-03-22 NOTE — ED Notes (Signed)
Pt alert, arrives from home, c/o infection to wound on left upper arm, seen and treated several times before, "i need IV antibiotics", resp even unlabored, skin pwd

## 2012-03-23 DIAGNOSIS — L02414 Cutaneous abscess of left upper limb: Secondary | ICD-10-CM

## 2012-03-23 DIAGNOSIS — IMO0002 Reserved for concepts with insufficient information to code with codable children: Principal | ICD-10-CM

## 2012-03-23 DIAGNOSIS — E871 Hypo-osmolality and hyponatremia: Secondary | ICD-10-CM

## 2012-03-23 DIAGNOSIS — E860 Dehydration: Secondary | ICD-10-CM

## 2012-03-23 DIAGNOSIS — L0291 Cutaneous abscess, unspecified: Secondary | ICD-10-CM

## 2012-03-23 LAB — CBC
HCT: 36.8 % — ABNORMAL LOW (ref 39.0–52.0)
MCH: 30.7 pg (ref 26.0–34.0)
MCHC: 35.1 g/dL (ref 30.0–36.0)
RDW: 12.2 % (ref 11.5–15.5)

## 2012-03-23 LAB — GLUCOSE, CAPILLARY: Glucose-Capillary: 262 mg/dL — ABNORMAL HIGH (ref 70–99)

## 2012-03-23 LAB — HEMOGLOBIN A1C
Hgb A1c MFr Bld: 13.4 % — ABNORMAL HIGH (ref <5.7)
Mean Plasma Glucose: 338 mg/dL — ABNORMAL HIGH (ref <117)

## 2012-03-23 MED ORDER — PNEUMOCOCCAL VAC POLYVALENT 25 MCG/0.5ML IJ INJ
0.5000 mL | INJECTION | Freq: Once | INTRAMUSCULAR | Status: AC
  Administered 2012-03-23: 0.5 mL via INTRAMUSCULAR
  Filled 2012-03-23: qty 0.5

## 2012-03-23 MED ORDER — NICOTINE 21 MG/24HR TD PT24
21.0000 mg | MEDICATED_PATCH | Freq: Once | TRANSDERMAL | Status: AC
  Administered 2012-03-23: 21 mg via TRANSDERMAL
  Filled 2012-03-23: qty 1

## 2012-03-23 MED ORDER — TRAMADOL HCL 50 MG PO TABS
50.0000 mg | ORAL_TABLET | Freq: Three times a day (TID) | ORAL | Status: DC | PRN
  Administered 2012-03-24 – 2012-03-25 (×3): 50 mg via ORAL
  Filled 2012-03-23 (×3): qty 1

## 2012-03-23 MED ORDER — BUSPIRONE HCL 10 MG PO TABS
10.0000 mg | ORAL_TABLET | Freq: Three times a day (TID) | ORAL | Status: DC
  Administered 2012-03-23 – 2012-03-31 (×24): 10 mg via ORAL
  Filled 2012-03-23 (×28): qty 1

## 2012-03-23 MED ORDER — INSULIN ASPART PROT & ASPART (70-30 MIX) 100 UNIT/ML ~~LOC~~ SUSP
50.0000 [IU] | Freq: Two times a day (BID) | SUBCUTANEOUS | Status: DC
  Administered 2012-03-23: 50 [IU] via SUBCUTANEOUS
  Filled 2012-03-23: qty 3

## 2012-03-23 MED ORDER — HYDROCODONE-ACETAMINOPHEN 5-325 MG PO TABS
1.0000 | ORAL_TABLET | Freq: Four times a day (QID) | ORAL | Status: DC | PRN
  Administered 2012-03-23: 1 via ORAL
  Filled 2012-03-23: qty 1

## 2012-03-23 MED ORDER — CLINDAMYCIN PHOSPHATE 600 MG/50ML IV SOLN
600.0000 mg | Freq: Once | INTRAVENOUS | Status: AC
  Administered 2012-03-23: 600 mg via INTRAVENOUS
  Filled 2012-03-23: qty 50

## 2012-03-23 MED ORDER — CITALOPRAM HYDROBROMIDE 20 MG PO TABS
20.0000 mg | ORAL_TABLET | Freq: Every day | ORAL | Status: DC
  Administered 2012-03-23 – 2012-03-31 (×9): 20 mg via ORAL
  Filled 2012-03-23 (×9): qty 1

## 2012-03-23 MED ORDER — SODIUM CHLORIDE 0.9 % IV SOLN
INTRAVENOUS | Status: DC
  Administered 2012-03-23: 06:00:00 via INTRAVENOUS
  Administered 2012-03-24 – 2012-03-25 (×2): 50 mL via INTRAVENOUS

## 2012-03-23 MED ORDER — INFLUENZA VIRUS VACC SPLIT PF IM SUSP
0.5000 mL | Freq: Once | INTRAMUSCULAR | Status: AC
  Administered 2012-03-23: 0.5 mL via INTRAMUSCULAR
  Filled 2012-03-23: qty 0.5

## 2012-03-23 MED ORDER — SODIUM CHLORIDE 0.9 % IV BOLUS (SEPSIS)
1000.0000 mL | Freq: Once | INTRAVENOUS | Status: AC
  Administered 2012-03-23: 1000 mL via INTRAVENOUS

## 2012-03-23 MED ORDER — HYDROMORPHONE HCL PF 1 MG/ML IJ SOLN
1.0000 mg | INTRAMUSCULAR | Status: AC | PRN
  Administered 2012-03-23: 1 mg via INTRAVENOUS
  Filled 2012-03-23: qty 1

## 2012-03-23 MED ORDER — GABAPENTIN 300 MG PO CAPS
600.0000 mg | ORAL_CAPSULE | Freq: Two times a day (BID) | ORAL | Status: DC
  Administered 2012-03-23 – 2012-03-31 (×17): 600 mg via ORAL
  Filled 2012-03-23 (×19): qty 2

## 2012-03-23 MED ORDER — FENOFIBRATE 160 MG PO TABS
160.0000 mg | ORAL_TABLET | Freq: Every day | ORAL | Status: DC
  Administered 2012-03-23 – 2012-03-31 (×9): 160 mg via ORAL
  Filled 2012-03-23 (×9): qty 1

## 2012-03-23 MED ORDER — CLINDAMYCIN PHOSPHATE 600 MG/50ML IV SOLN
600.0000 mg | Freq: Three times a day (TID) | INTRAVENOUS | Status: DC
  Administered 2012-03-23 – 2012-03-26 (×10): 600 mg via INTRAVENOUS
  Filled 2012-03-23 (×11): qty 50

## 2012-03-23 MED ORDER — OXYCODONE-ACETAMINOPHEN 5-325 MG PO TABS
1.0000 | ORAL_TABLET | Freq: Four times a day (QID) | ORAL | Status: DC | PRN
Start: 2012-03-23 — End: 2012-03-24
  Administered 2012-03-23 – 2012-03-24 (×3): 2 via ORAL
  Filled 2012-03-23 (×3): qty 2

## 2012-03-23 MED ORDER — HYDROMORPHONE HCL PF 1 MG/ML IJ SOLN
1.0000 mg | Freq: Once | INTRAMUSCULAR | Status: AC
  Administered 2012-03-23: 1 mg via INTRAVENOUS
  Filled 2012-03-23: qty 1

## 2012-03-23 MED ORDER — LISINOPRIL 10 MG PO TABS
10.0000 mg | ORAL_TABLET | Freq: Every day | ORAL | Status: DC
  Administered 2012-03-23 – 2012-03-29 (×7): 10 mg via ORAL
  Filled 2012-03-23 (×7): qty 1

## 2012-03-23 MED ORDER — MORPHINE SULFATE 4 MG/ML IJ SOLN
4.0000 mg | Freq: Once | INTRAMUSCULAR | Status: AC
Start: 2012-03-23 — End: 2012-03-23
  Administered 2012-03-23: 4 mg via INTRAVENOUS
  Filled 2012-03-23: qty 1

## 2012-03-23 MED ORDER — INSULIN ASPART 100 UNIT/ML ~~LOC~~ SOLN: 50.0000 [IU] | Freq: Once | SUBCUTANEOUS | Status: DC

## 2012-03-23 MED ORDER — QUETIAPINE FUMARATE ER 50 MG PO TB24
150.0000 mg | ORAL_TABLET | Freq: Every day | ORAL | Status: DC
  Administered 2012-03-23 – 2012-03-31 (×9): 150 mg via ORAL
  Filled 2012-03-23 (×9): qty 3

## 2012-03-23 MED ORDER — MELOXICAM 15 MG PO TABS
15.0000 mg | ORAL_TABLET | Freq: Every day | ORAL | Status: DC
  Administered 2012-03-23: 15 mg via ORAL
  Filled 2012-03-23 (×2): qty 1

## 2012-03-23 MED ORDER — LIDOCAINE HCL 1 % IJ SOLN
INTRAMUSCULAR | Status: AC
  Administered 2012-03-23: 03:00:00
  Filled 2012-03-23: qty 20

## 2012-03-23 MED ORDER — INSULIN ASPART 100 UNIT/ML ~~LOC~~ SOLN
0.0000 [IU] | Freq: Three times a day (TID) | SUBCUTANEOUS | Status: DC
  Administered 2012-03-23 (×2): 3 [IU] via SUBCUTANEOUS
  Administered 2012-03-24: 5 [IU] via SUBCUTANEOUS
  Administered 2012-03-24 (×2): 11 [IU] via SUBCUTANEOUS
  Administered 2012-03-25: 15 [IU] via SUBCUTANEOUS
  Administered 2012-03-25: 3 [IU] via SUBCUTANEOUS
  Administered 2012-03-25 – 2012-03-26 (×2): 8 [IU] via SUBCUTANEOUS

## 2012-03-23 MED ORDER — ARTIFICIAL TEARS OP OINT
TOPICAL_OINTMENT | Freq: Once | OPHTHALMIC | Status: AC
  Administered 2012-03-23: 04:00:00 via OPHTHALMIC
  Filled 2012-03-23: qty 3.5

## 2012-03-23 MED ORDER — NAPROXEN 250 MG PO TABS
250.0000 mg | ORAL_TABLET | Freq: Two times a day (BID) | ORAL | Status: DC
  Administered 2012-03-23 – 2012-03-24 (×3): 250 mg via ORAL
  Filled 2012-03-23 (×5): qty 1

## 2012-03-23 MED ORDER — GLIMEPIRIDE 4 MG PO TABS
4.0000 mg | ORAL_TABLET | Freq: Every day | ORAL | Status: DC
  Administered 2012-03-23: 4 mg via ORAL
  Filled 2012-03-23 (×2): qty 1

## 2012-03-23 NOTE — Progress Notes (Signed)
[facility] PROGRESS NOTE  Charles Manning:[id number] DOB: 26-Nov-1955 DOA: 03/22/2012 PCP: Jackie Plum, MD  Assessment/Plan: 1. Left upper arm abscess & cellulitis--erythema receding, but suspect large abscess. Consult orthopedics for further management. Continue empiric clindamycin as appears improved. 2. DM type 2 uncontrolled--Labile. Consult nutrition, RN DM coordinator. Hgb A1c 13.4. SSI. Continue Amaryl, insulin 70/30 post-procedure. 3. Hyponatremia--check BMP in AM. Etiology unclear, suspect dehydration. IVF. 4. Suspected dehydration--IVF. 5. Depression/anxiety--stable. Continue Seroquel, Celexa, Buspar. 6. Chronic pain syndrome--stable, continue gabapentin.  Code Status: full code Family Communication: none present Disposition Plan: home when improved  Brendia Sacks, MD  [facility] Team 6 Pager [phone]. If 8PM-8AM, please contact night-coverage at [email or web address], password [facility] 03/23/2012, 12:14 PM  LOS: 1 day   Brief narrative: 56 year old man presented with LUE cellulitis and abscess.  Consultants:  Orthopedics  Procedures:  10/16 I&D in ED  Antibiotics:  Clindamycin 10/16 >>  HPI/Subjective: Feels ok, some pain still in arm, but controlled with narcotics. No drainage. No n/v. No other issues.  Objective: Filed Vitals:   03/22/12 2128 03/23/12 0348 03/23/12 0500 03/23/12 0856  BP: 145/83 136/84 129/88 145/101  Pulse: 67 64 66   Temp: 98 F (36.7 C) 98 F (36.7 C) 98.2 F (36.8 C)   TempSrc: Oral Oral Oral   Resp: 16 18 20    Height:   5\' 10"  (1.778 m)   Weight:   74.844 kg (165 lb)   SpO2: 99% 98% 96%     Intake/Output Summary (Last 24 hours) at 03/23/12 1214 Last data filed at 03/23/12 1206  Gross per 24 hour  Intake 398.33 ml  Output    400 ml  Net  -1.67 ml   Filed Weights   03/23/12 0500  Weight: 74.844 kg (165 lb)    Exam:  General:  Appears calm and comfortable, eating lunch as I enter. Cardiovascular: RRR,  no m/r/g. No LE edema. Respiratory: CTA bilaterally, no w/r/r. Normal respiratory effort. Skin: left upper arm--marked borders noted, erythema has receded significantly. Incision packed, no drainage. Induration inner bicep inferior to incision, non-tender. Psychiatric: grossly normal mood, odd affect, speech fluent and appropriate  Data Reviewed: Basic Metabolic Panel:  Lab 03/22/12 8295  NA 130*  K 4.4  CL 94*  CO2 25  GLUCOSE 489*  BUN 24*  CREATININE 0.84  CALCIUM 9.4  MG --  PHOS --   CBC:  Lab 03/23/12 0526 03/22/12 2145  WBC 16.6* 18.8*  NEUTROABS -- 12.6*  HGB 12.9* 14.0  HCT 36.8* 39.5  MCV 87.6 87.8  PLT 373 398   CBG:  Lab 03/23/12 0727  GLUCAP 162*   Studies: No results found.  Scheduled Meds:   . artificial tears   Both Eyes Once  . busPIRone  10 mg Oral TID  . citalopram  20 mg Oral Daily  . clindamycin (CLEOCIN) IV  600 mg Intravenous Once  . clindamycin (CLEOCIN) IV  600 mg Intravenous Q8H  . fenofibrate  160 mg Oral Daily  . gabapentin  600 mg Oral BID  . glimepiride  4 mg Oral QAC breakfast  .  HYDROmorphone (DILAUDID) injection  1 mg Intravenous Once  . influenza  inactive virus vaccine  0.5 mL Intramuscular Once  . insulin aspart  0-15 Units Subcutaneous TID WC  . insulin aspart protamine-insulin aspart  50 Units Subcutaneous BID WC  . lidocaine      . lisinopril  10 mg Oral Daily  . meloxicam  15 mg Oral Daily  .  morphine injection  4 mg Intravenous Once  . naproxen  250 mg Oral BID WC  . nicotine  21 mg Transdermal Once  . pneumococcal 23 valent vaccine  0.5 mL Intramuscular Once  . QUEtiapine  150 mg Oral Daily  . sodium chloride  1,000 mL Intravenous Once  . DISCONTD: insulin aspart  50 Units Subcutaneous Once   Continuous Infusions:   . sodium chloride 50 mL/hr at 03/23/12 0900    Principal Problem:  *Abscess of left arm Active Problems:  HTN (hypertension)  Diabetes mellitus type 2 with complications, uncontrolled   Depression  Chronic pain syndrome  Hyponatremia  Dehydration     Brendia Sacks, MD  [facility] Team 6 Pager [phone]. If 8PM-8AM, please contact night-coverage at [email or web address], password [facility] 03/23/2012, 12:14 PM  LOS: 1 day   Time spent: 25 minutes

## 2012-03-23 NOTE — Plan of Care (Signed)
Problem: Food- and Nutrition-Related Knowledge Deficit (NB-1.1) Goal: Nutrition education Formal process to instruct or train a patient/client in a skill or to impart knowledge to help patients/clients voluntarily manage or modify food choices and eating behavior to maintain or improve health.  Outcome: Completed/Met Date Met:  03/23/12 Met with pt who reports being "stupid" about his diabetes PTA but wants to do better. Pt reports skipping meals, particularly breakfast, and drinking excess amounts of milk. Pt reports other than milk he drinks a lot of diet soda but he has been trying to drink more water. Pt reports knowing what he needs to do but just not doing it. Discussed diabetic diet and recommended portion sizes of carbohydrate containing foods. Provided handouts of this information. Pt appeared motivated to change. Expect good compliance as long as pt gets outpatient nutrition follow up.

## 2012-03-23 NOTE — H&P (Signed)
[facility] History and Physical  Charles Manning WUJ:[id number] DOB: 31-Dec-1955 DOA: 03/22/2012  Referring physician: ED PCP: No primary provider on file.  Specialists: None  Chief Complaint: Abscess  HPI: Charles Manning is a 56 y.o. male who presents with an abscess on his LUE, onset last week, associated with significant pain, I+D performed by PCP 6 days ago, started on ABX at that time.  Swelling and drainage continued and worsened, did have 1 episode of night sweats several days ago but no reported fever nor chills.  Occuring in the context of poorly controlled IDDM.  Review of Systems: 12 systems reviewed and otherwise negative.  Past Medical History  Diagnosis Date  . HTN (hypertension) 09/25/2011  . DM (diabetes mellitus) 09/25/2011  . Degenerative joint disease 09/25/2011  . Depression 09/25/2011  . Anxiety 09/25/2011  . Chronic pain syndrome 09/25/2011  . Basal cell cancer 09/25/2011    Mid back 2007  . Melanoma 09/25/2011    Right thigh 2005   Past Surgical History  Procedure Date  . Tumor removal from right thigh   . Lymph node removal    Social History:  reports that he has been smoking Cigarettes.  He has been smoking about .5 packs per day. He has never used smokeless tobacco. He reports that he drinks alcohol. He reports that he does not use illicit drugs. Patient lives at home and performs all ADLs  Allergies  Allergen Reactions  . Vancomycin Itching    Family History  Problem Relation Age of Onset  . Aneurysm Mother   . Cancer Father    Prior to Admission medications   Medication Sig Start Date End Date Taking? Authorizing Provider  busPIRone (BUSPAR) 10 MG tablet Take 1 tablet (10 mg total) by mouth 3 (three) times daily. 01/20/12 01/19/13 Yes Vassie Loll, MD  citalopram (CELEXA) 20 MG tablet Take 1 tablet (20 mg total) by mouth daily. 01/20/12  Yes Vassie Loll, MD  fenofibrate 160 MG tablet Take 1 tablet (160 mg total) by mouth daily. 01/20/12 01/19/13  Yes Vassie Loll, MD  gabapentin (NEURONTIN) 600 MG tablet Take 600 mg by mouth 2 (two) times daily.   Yes Historical Provider, MD  glimepiride (AMARYL) 4 MG tablet Take 4 mg by mouth daily before breakfast.   Yes Historical Provider, MD  HYDROcodone-acetaminophen (NORCO/VICODIN) 5-325 MG per tablet Take 1 tablet by mouth every 6 (six) hours as needed. For pain 01/20/12  Yes Vassie Loll, MD  insulin aspart protamine-insulin aspart (NOVOLOG 70/30) (70-30) 100 UNIT/ML injection Inject 50 Units into the skin 2 (two) times daily with a meal.   Yes Historical Provider, MD  insulin glargine (LANTUS) 100 UNIT/ML injection Inject 40 Units into the skin at bedtime.   Yes Historical Provider, MD  lisinopril (PRINIVIL,ZESTRIL) 10 MG tablet Take 10 mg by mouth daily.   Yes Historical Provider, MD  meloxicam (MOBIC) 15 MG tablet Take 15 mg by mouth daily.   Yes Historical Provider, MD  naproxen (NAPROSYN) 250 MG tablet Take 250 mg by mouth 2 (two) times daily with a meal.   Yes Historical Provider, MD  QUEtiapine (SEROQUEL XR) 150 MG 24 hr tablet Take 1 tablet (150 mg total) by mouth daily. 01/20/12  Yes Vassie Loll, MD  traMADol (ULTRAM) 50 MG tablet Take 50 mg by mouth every 8 (eight) hours as needed. For pain   Yes Historical Provider, MD  TRAZODONE HCL PO Take by mouth.   Yes Historical Provider, MD   Physical  Exam: Filed Vitals:   03/22/12 2128 03/23/12 0348  BP: 145/83 136/84  Pulse: 67 64  Temp: 98 F (36.7 C) 98 F (36.7 C)  TempSrc: Oral Oral  Resp: 16 18  SpO2: 99% 98%     General:  NAD, resting comfortably in hospital bed  Eyes: PEERLA EOMI  ENT: mucous membranes moist  Neck: supple w/o JVD  Cardiovascular: RRR w/o MRG  Respiratory: CTA B  Abdomen: soft, nt, nd, bs+  Skin: patient has a large abscess s/p drainage here in ED with > 5 cm surrounding cellulitus, does extend slightly beyond margins drawn by PCP  Musculoskeletal: MAE, LUE not tested however given  abscess  Psychiatric: normal tone and affect  Neurologic: AAOx3, grossly non-focal  Labs on Admission:  Basic Metabolic Panel:  Lab 03/22/12 1610  NA 130*  K 4.4  CL 94*  CO2 25  GLUCOSE 489*  BUN 24*  CREATININE 0.84  CALCIUM 9.4  MG --  PHOS --   Liver Function Tests: No results found for this basename: AST:5,ALT:5,ALKPHOS:5,BILITOT:5,PROT:5,ALBUMIN:5 in the last 168 hours No results found for this basename: LIPASE:5,AMYLASE:5 in the last 168 hours No results found for this basename: AMMONIA:5 in the last 168 hours CBC:  Lab 03/22/12 2145  WBC 18.8*  NEUTROABS 12.6*  HGB 14.0  HCT 39.5  MCV 87.8  PLT 398   Cardiac Enzymes: No results found for this basename: CKTOTAL:5,CKMB:5,CKMBINDEX:5,TROPONINI:5 in the last 168 hours  BNP (last 3 results) No results found for this basename: PROBNP:3 in the last 8760 hours CBG: No results found for this basename: GLUCAP:5 in the last 168 hours  Radiological Exams on Admission: No results found.  EKG: Independently reviewed.  Assessment/Plan Active Problems:  HTN (hypertension)  Diabetes mellitus type 2 with complications, uncontrolled  Abscess   1. Abscess with cellulitis - admitting patient putting on clindamycin IV for now, GS and culture of wound ordered, BCX drawn, no evidence other than leukocytosis and the history of 1 episode of night sweats of systemic involvement, but agree that patient should be admitted for IV abx until a good PO agent can be selected as he has effectively failed outpatient therapy at this point.  If results come back as GPC then would change to vanc but i see that vancomycin is in his allergy list although he only gets itching with it. 2. DM2 - poorly controlled, will start his home 70/30, put patient on medium dose SSI as well for now, patient says he is supposed to be on lantus AND 70/30 but he "just found that out today" will leave patient off of lantus for now. 3. HTN - continue home  meds 4. Leukocytosis - monitor with AM cbc but other than the leukocytosis no other SIRS criteria met for diagnosis of sepsis.  Code Status: Full Code Family Communication: No family at bedside Disposition Plan: Admit to obs but I believe he may meet inpatient criteria as a failed outpatient abx  Time spent: 50 min  GARDNER, JARED M. [facility] Pager [phone]  If 7PM-7AM, please contact night-coverage [email or web address] Password [email or web address] 03/23/2012, 3:57 AM

## 2012-03-23 NOTE — ED Notes (Signed)
PA at bedside.

## 2012-03-23 NOTE — ED Provider Notes (Signed)
Medical screening examination/treatment/procedure(s) were performed by non-physician practitioner and as supervising physician I was immediately available for consultation/collaboration.   India Jolin L Amandeep Hogston, MD 03/23/12 0740 

## 2012-03-23 NOTE — ED Provider Notes (Signed)
History     CSN: [id number]  Arrival date & time 03/22/12  2114   First MD Initiated Contact with Patient 03/23/12 0056      Chief Complaint  Patient presents with  . Wound Infection    (Consider location/radiation/quality/duration/timing/severity/associated sxs/prior treatment) HPI History provided by pt.   Pt developed an abscess w/ surrounding cellulitis of left upper arm last week.  Had an I&D performed by his PCP 6 days ago and was started on an antibiotic.  Returned to PCP for persistent sx a few days later and received 2 IM injections.  Comes to ED this morning, because despite compliance w/ abx, pain and swelling has worsened.  No associated fever.  Pt has insulin-dependent diabetes.  He is not compliant with his medications and blood sugar poorly controlled.  Other than pain in arm, patient is feeling well.   Past Medical History  Diagnosis Date  . HTN (hypertension) 09/25/2011  . DM (diabetes mellitus) 09/25/2011  . Degenerative joint disease 09/25/2011  . Depression 09/25/2011  . Anxiety 09/25/2011  . Chronic pain syndrome 09/25/2011  . Basal cell cancer 09/25/2011    Mid back 2007  . Melanoma 09/25/2011    Right thigh 2005    Past Surgical History  Procedure Date  . Tumor removal from right thigh   . Lymph node removal     Family History  Problem Relation Age of Onset  . Aneurysm Mother   . Cancer Father     History  Substance Use Topics  . Smoking status: Current Every Day Smoker -- 0.5 packs/day    Types: Cigarettes  . Smokeless tobacco: Never Used  . Alcohol Use: Yes     seldom      Review of Systems  All other systems reviewed and are negative.    Allergies  Vancomycin  Home Medications   Current Outpatient Rx  Name Route Sig Dispense Refill  . BUSPIRONE HCL 10 MG PO TABS Oral Take 1 tablet (10 mg total) by mouth 3 (three) times daily. 90 tablet 0  . CITALOPRAM HYDROBROMIDE 20 MG PO TABS Oral Take 1 tablet (20 mg total) by mouth daily. 30  tablet 0  . FENOFIBRATE 160 MG PO TABS Oral Take 1 tablet (160 mg total) by mouth daily. 30 tablet 0  . GABAPENTIN 600 MG PO TABS Oral Take 600 mg by mouth 2 (two) times daily.    Marland Kitchen GLIMEPIRIDE 4 MG PO TABS Oral Take 4 mg by mouth daily before breakfast.    . HYDROCODONE-ACETAMINOPHEN 5-325 MG PO TABS Oral Take 1 tablet by mouth every 6 (six) hours as needed. For pain    . INSULIN ASPART PROT & ASPART (70-30) 100 UNIT/ML [place] SUSP Subcutaneous Inject 50 Units into the skin 2 (two) times daily with a meal.    . INSULIN GLARGINE 100 UNIT/ML [place] SOLN Subcutaneous Inject 40 Units into the skin at bedtime.    Marland Kitchen LISINOPRIL 10 MG PO TABS Oral Take 10 mg by mouth daily.    . MELOXICAM 15 MG PO TABS Oral Take 15 mg by mouth daily.    Marland Kitchen NAPROXEN 250 MG PO TABS Oral Take 250 mg by mouth 2 (two) times daily with a meal.    . QUETIAPINE FUMARATE ER 150 MG PO TB24 Oral Take 1 tablet (150 mg total) by mouth daily. 30 each 0  . TRAMADOL HCL 50 MG PO TABS Oral Take 50 mg by mouth every 8 (eight) hours as needed. For pain    .  TRAZODONE HCL PO Oral Take by mouth.      BP 145/83  Pulse 67  Temp 98 F (36.7 C) (Oral)  Resp 16  SpO2 99%  Physical Exam  Nursing note and vitals reviewed. Constitutional: He is oriented to person, place, and time. He appears well-developed and well-nourished. No distress.  HENT:  Head: Normocephalic and atraumatic.  Eyes:       Normal appearance  Neck: Normal range of motion.  Cardiovascular: Normal rate and regular rhythm.   Pulmonary/Chest: Effort normal and breath sounds normal. No respiratory distress.  Musculoskeletal: Normal range of motion.       Flexor surface of entire LUE is erythematous (beyond margins drawn by patient's PCP).  Deep erythema and induration in upper arm.  Abscess draining purulent fluid in upper arm as well.  Tenderness of upper arm only.  Full active ROM all joints LUE.  2+ radial pulse. Chronic lymphedema RLE.   Neurological: He is alert and  oriented to person, place, and time.  Skin: Skin is warm and dry. No rash noted.  Psychiatric: He has a normal mood and affect. His behavior is normal.    ED Course  Procedures (including critical care time) INCISION AND DRAINAGE Performed by: Otilio Miu Consent: Verbal consent obtained. Risks and benefits: risks, benefits and alternatives were discussed Type: abscess  Body area: left upper arm Anesthesia: local infiltration  Local anesthetic: lidocaine 1% w/out epinephrine  Anesthetic total: 5 ml  Complexity: complex Blunt dissection to break up loculations  Drainage: purulent  Drainage amount: minimal  Packing material: 1/4 in iodoform gauze  Patient tolerance: Patient tolerated the procedure well with no immediate complications.    Labs Reviewed  CBC WITH DIFFERENTIAL - Abnormal; Notable for the following:    WBC 18.8 (*)     Neutro Abs 12.6 (*)     Monocytes Absolute 1.5 (*)     Eosinophils Absolute 0.8 (*)     All other components within normal limits  BASIC METABOLIC PANEL - Abnormal; Notable for the following:    Sodium 130 (*)     Chloride 94 (*)     Glucose, Bld 489 (*)     BUN 24 (*)     All other components within normal limits   No results found.   1. Cellulitis of left upper extremity       MDM  56yo insulin-dependent diabetic M presents w/ abscess and cellulitis of LUE that has failed outpatient therapy.  There was drainage of pus on initial exam but minimal w/ I&D.  Pt reports the he squeezed it.  He is non-compliant with his insulin and BG is 489 w/out acidosis this evening.  IV NS bolus and novolog ordered for treatment.  Labs otherwise sig for leukocytosis.  Clindamycin initiated in ED and pt admitted to [facility] for further management.          Otilio Miu, [place] 03/23/12 [phone]

## 2012-03-23 NOTE — ED Notes (Signed)
Pt ambulated to bathroom 

## 2012-03-23 NOTE — Care Management Note (Signed)
    Page 1 of 1   03/29/2012     2:24:24 PM   CARE MANAGEMENT NOTE 03/29/2012  Patient:  Charles Manning, Charles Manning   Account Number:  [id number]  Date Initiated:  03/23/2012  Documentation initiated by:  Lorenda Ishihara  Subjective/Objective Assessment:   56 yo male admitted with abscess and cellulitis of UE, failed OP treatment. PTA lived at home alone.     Action/Plan:   home at d/c   Anticipated DC Date:  03/30/2012   Anticipated DC Plan:  HOME/SELF CARE      DC Planning Services  CM consult  Patient refused services      Choice offered to / List presented to:             Status of service:  Completed, signed off Medicare Important Message given?   (If response is "NO", the following Medicare IM given date fields will be blank) Date Medicare IM given:   Date Additional Medicare IM given:    Discharge Disposition:  HOME/SELF CARE  Per UR Regulation:  Reviewed for med. necessity/level of care/duration of stay  If discussed at Long Length of Stay Meetings, dates discussed:   03/29/2012    Comments:  03-29-12 Lorenda Ishihara RN CM 1100 Recieved a referral for [facility] RN for wound care. Spoke with patient at bedside, states he does not feel he will need HH services. Feels he can manage by himself with instruction. He does not feel RN would be beneficial to his care, he states he is not home alot and trying to arrange services would be difficult. Notified attending.

## 2012-03-23 NOTE — ED Notes (Signed)
Pt ID band not working. Checked CBG x 2. Left hand 119 Right hand 122 Physician discontinued current Novolog orders and informed me that he would not need Novolog with current CBG.

## 2012-03-24 DIAGNOSIS — E1165 Type 2 diabetes mellitus with hyperglycemia: Secondary | ICD-10-CM

## 2012-03-24 DIAGNOSIS — E871 Hypo-osmolality and hyponatremia: Secondary | ICD-10-CM

## 2012-03-24 DIAGNOSIS — E860 Dehydration: Secondary | ICD-10-CM

## 2012-03-24 LAB — BASIC METABOLIC PANEL
BUN: 14 mg/dL (ref 6–23)
Chloride: 103 mEq/L (ref 96–112)
GFR calc Af Amer: >90 mL/min (ref >90)
Potassium: 4.7 mEq/L (ref 3.5–5.1)
Sodium: 134 mEq/L — ABNORMAL LOW (ref 135–145)

## 2012-03-24 LAB — GLUCOSE, CAPILLARY: Glucose-Capillary: 122 mg/dL — ABNORMAL HIGH (ref 70–99)

## 2012-03-24 LAB — CBC
HCT: 40 % (ref 39.0–52.0)
Hemoglobin: 13.8 g/dL (ref 13.0–17.0)
MCHC: 34.5 g/dL (ref 30.0–36.0)
RBC: 4.5 MIL/uL (ref 4.22–5.81)
WBC: 18.1 10*3/uL — ABNORMAL HIGH (ref 4.0–10.5)

## 2012-03-24 MED ORDER — INSULIN GLARGINE 100 UNIT/ML ~~LOC~~ SOLN
10.0000 [IU] | Freq: Every day | SUBCUTANEOUS | Status: DC
  Administered 2012-03-24: 10 [IU] via SUBCUTANEOUS

## 2012-03-24 MED ORDER — NICOTINE 21 MG/24HR TD PT24
21.0000 mg | MEDICATED_PATCH | Freq: Every day | TRANSDERMAL | Status: DC
  Administered 2012-03-24 – 2012-03-26 (×3): 21 mg via TRANSDERMAL
  Filled 2012-03-24 (×5): qty 1

## 2012-03-24 MED ORDER — OXYCODONE-ACETAMINOPHEN 5-325 MG PO TABS
1.0000 | ORAL_TABLET | Freq: Four times a day (QID) | ORAL | Status: DC | PRN
  Administered 2012-03-24 – 2012-03-25 (×4): 2 via ORAL
  Filled 2012-03-24 (×4): qty 2

## 2012-03-24 MED ORDER — ACETAMINOPHEN 325 MG PO TABS
650.0000 mg | ORAL_TABLET | Freq: Four times a day (QID) | ORAL | Status: DC | PRN
  Administered 2012-03-29 – 2012-03-30 (×2): 650 mg via ORAL
  Filled 2012-03-24 (×2): qty 2

## 2012-03-24 NOTE — Progress Notes (Addendum)
Spoke to Dr. Irene Limbo on floor informed him per diabetes coordinator if patient going to be npo she recommends lantus but if going to have diet then novolog 70/30. MD states patient is going to continue to be npo and will order lantus. Informed last blood sugar 305

## 2012-03-24 NOTE — Progress Notes (Signed)
Inpatient Diabetes Program Recommendations  AACE/ADA: New Consensus Statement on Inpatient Glycemic Control (2013)  Target Ranges:  Prepandial:   less than 140 mg/dL      Peak postprandial:   less than 180 mg/dL (1-2 hours)      Critically ill patients:  140 - 180 mg/dL   Reason for Visit: Uncontrolled DM  56 yo WM developed an abscess w/ surrounding cellulitis of left upper arm last week. Had an I&D performed by his PCP 6 days ago and was started on an antibiotic. Returned to PCP for persistent sx a few days later and received 2 IM injections. Comes to ED this morning, because despite compliance w/ abx, pain and swelling has worsened. No associated fever. Pt has insulin-dependent diabetes. He is not compliant with his medications and blood sugars are poorly controlled. Previously seen at [facility] and then most recently [facility].  Has not been taking insulin as prescribed.  Has not checked blood sugars, but does have meter and strips at home.  Prefers insulin pen over syringes since per pt he has past hx of drug abuse.  States he doesn't have time to go to OP Diabetes Education classes d/t being on probation and doing community service. Long discussion about importance of controlling blood sugars at home and f/u with PCP. States he's lost over 25 pounds in last few months. Pt states he's willing to take 2 shots/day, but not any more. When asked why he didn't take his insulin at home, he said  "I'm just lazy.  I get tired."  Results for ANDY, ALLENDE (MRN [id number]) as of 03/24/2012 11:25  Ref. Range 03/23/2012 05:20  Hemoglobin A1C Latest Range: <5.7 % 13.4 (H)  Results for TUKKER, BYRNS (MRN [id number]) as of 03/24/2012 11:25  Ref. Range 03/23/2012 07:27 03/23/2012 12:14 03/23/2012 16:04 03/23/2012 21:52 03/24/2012 08:37  Glucose-Capillary Latest Range: 70-99 mg/dL 782 (H) 75 956 (H) 213 (H) 242 (H)   Recommendations:  Restart 70/30 insulin 30 units bid.  Titrate until  CBGs < 150 mg/dL. Add HS coverage to Novolog correction insulin. Will order OP Diabetes Education consult for after discharge. Encouraged pt to view diabetes videos on pt ed channel.   Will continue to follow.  Thank you. Ailene Ards, RD, LDN, CDE Inpatient Diabetes Coordinator

## 2012-03-24 NOTE — Consult Note (Signed)
Reason for Consult: Abscess left arm from injection from illegal drug. Referring Physician: AQIL GOETTING is an 56 y.o. male.  HPI: Patient is a 56 year old gentleman with uncontrolled diabetes hemoglobin A1c of 13.4 who reports a three-week history of injection of the illegal drugs into his left arm. He states he had been seen in urgent care a small stab incision was made over the abscess and a drain was placed to keep this open wound was about 5 mm in length. Patient complains of persistent pain and swelling  and cellulitis.  Past Medical History  Diagnosis Date  . HTN (hypertension) 09/25/2011  . DM (diabetes mellitus) 09/25/2011  . Degenerative joint disease 09/25/2011  . Depression 09/25/2011  . Anxiety 09/25/2011  . Chronic pain syndrome 09/25/2011  . Basal cell cancer 09/25/2011    Mid back 2007  . Melanoma 09/25/2011    Right thigh 2005    Past Surgical History  Procedure Date  . Tumor removal from right thigh   . Lymph node removal     Family History  Problem Relation Age of Onset  . Aneurysm Mother   . Cancer Father     Social History:  reports that he has been smoking Cigarettes.  He has been smoking about .5 packs per day. He has never used smokeless tobacco. He reports that he drinks alcohol. He reports that he does not use illicit drugs.  Allergies:  Allergies  Allergen Reactions  . Vancomycin Itching    Medications: I have reviewed the patient's current medications.  Results for orders placed during the hospital encounter of 03/22/12 (from the past 48 hour(s))  CBC WITH DIFFERENTIAL     Status: Abnormal   Collection Time   03/22/12  9:45 PM      Component Value Range Comment   WBC 18.8 (*) 4.0 - 10.5 K/uL    RBC 4.50  4.22 - 5.81 MIL/uL    Hemoglobin 14.0  13.0 - 17.0 g/dL    HCT 96.0  45.4 - 09.8 %    MCV 87.8  78.0 - 100.0 fL    MCH 31.1  26.0 - 34.0 pg    MCHC 35.4  30.0 - 36.0 g/dL    RDW 11.9  14.7 - 82.9 %    Platelets 398  150 - 400 K/uL     Neutrophils Relative 67  43 - 77 %    Lymphocytes Relative 21  12 - 46 %    Monocytes Relative 8  3 - 12 %    Eosinophils Relative 4  0 - 5 %    Basophils Relative 0  0 - 1 %    Neutro Abs 12.6 (*) 1.7 - 7.7 K/uL    Lymphs Abs 3.9  0.7 - 4.0 K/uL    Monocytes Absolute 1.5 (*) 0.1 - 1.0 K/uL    Eosinophils Absolute 0.8 (*) 0.0 - 0.7 K/uL    Basophils Absolute 0.0  0.0 - 0.1 K/uL   BASIC METABOLIC PANEL     Status: Abnormal   Collection Time   03/22/12  9:45 PM      Component Value Range Comment   Sodium 130 (*) 135 - 145 mEq/L    Potassium 4.4  3.5 - 5.1 mEq/L    Chloride 94 (*) 96 - 112 mEq/L    CO2 25  19 - 32 mEq/L    Glucose, Bld 489 (*) 70 - 99 mg/dL    BUN 24 (*) 6 - 23  mg/dL    Creatinine, Ser 9.60  0.50 - 1.35 mg/dL    Calcium 9.4  8.4 - 45.4 mg/dL    GFR calc non Af Amer >90  >90 mL/min    GFR calc Af Amer >90  >90 mL/min   GLUCOSE, CAPILLARY     Status: Abnormal   Collection Time   03/23/12  3:26 AM      Component Value Range Comment   Glucose-Capillary 119 (*) 70 - 99 mg/dL   GLUCOSE, CAPILLARY     Status: Abnormal   Collection Time   03/23/12  3:37 AM      Component Value Range Comment   Glucose-Capillary 122 (*) 70 - 99 mg/dL   WOUND CULTURE     Status: Normal (Preliminary result)   Collection Time   03/23/12  4:08 AM      Component Value Range Comment   Specimen Description ARM      Special Requests NONE      Gram Stain        Value: RARE WBC PRESENT, PREDOMINANTLY PMN     NO SQUAMOUS EPITHELIAL CELLS SEEN     NO ORGANISMS SEEN   Culture NO GROWTH 1 DAY      Report Status PENDING     HEMOGLOBIN A1C     Status: Abnormal   Collection Time   03/23/12  5:20 AM      Component Value Range Comment   Hemoglobin A1C 13.4 (*) <5.7 %    Mean Plasma Glucose 338 (*) <117 mg/dL   CBC     Status: Abnormal   Collection Time   03/23/12  5:26 AM      Component Value Range Comment   WBC 16.6 (*) 4.0 - 10.5 K/uL    RBC 4.20 (*) 4.22 - 5.81 MIL/uL    Hemoglobin  12.9 (*) 13.0 - 17.0 g/dL    HCT 09.8 (*) 11.9 - 52.0 %    MCV 87.6  78.0 - 100.0 fL    MCH 30.7  26.0 - 34.0 pg    MCHC 35.1  30.0 - 36.0 g/dL    RDW 14.7  82.9 - 56.2 %    Platelets 373  150 - 400 K/uL   GLUCOSE, CAPILLARY     Status: Abnormal   Collection Time   03/23/12  7:27 AM      Component Value Range Comment   Glucose-Capillary 162 (*) 70 - 99 mg/dL    Comment 1 Notify RN      Comment 2 Documented in Chart     GLUCOSE, CAPILLARY     Status: Normal   Collection Time   03/23/12 12:14 PM      Component Value Range Comment   Glucose-Capillary 75  70 - 99 mg/dL    Comment 1 Notify RN      Comment 2 Documented in Chart     GLUCOSE, CAPILLARY     Status: Abnormal   Collection Time   03/23/12  4:04 PM      Component Value Range Comment   Glucose-Capillary 176 (*) 70 - 99 mg/dL   GLUCOSE, CAPILLARY     Status: Abnormal   Collection Time   03/23/12  9:52 PM      Component Value Range Comment   Glucose-Capillary 262 (*) 70 - 99 mg/dL    Comment 1 Notify RN     BASIC METABOLIC PANEL     Status: Abnormal   Collection Time   03/24/12  4:00 AM      Component Value Range Comment   Sodium 134 (*) 135 - 145 mEq/L    Potassium 4.7  3.5 - 5.1 mEq/L    Chloride 103  96 - 112 mEq/L DELTA CHECK NOTED   CO2 24  19 - 32 mEq/L    Glucose, Bld 298 (*) 70 - 99 mg/dL    BUN 14  6 - 23 mg/dL    Creatinine, Ser 4.13  0.50 - 1.35 mg/dL    Calcium 8.9  8.4 - 24.4 mg/dL    GFR calc non Af Amer >90  >90 mL/min    GFR calc Af Amer >90  >90 mL/min   CBC     Status: Abnormal   Collection Time   03/24/12  4:00 AM      Component Value Range Comment   WBC 18.1 (*) 4.0 - 10.5 K/uL    RBC 4.50  4.22 - 5.81 MIL/uL    Hemoglobin 13.8  13.0 - 17.0 g/dL    HCT 01.0  27.2 - 53.6 %    MCV 88.9  78.0 - 100.0 fL    MCH 30.7  26.0 - 34.0 pg    MCHC 34.5  30.0 - 36.0 g/dL    RDW 64.4  03.4 - 74.2 %    Platelets 407 (*) 150 - 400 K/uL   GLUCOSE, CAPILLARY     Status: Abnormal   Collection Time    03/24/12  8:37 AM      Component Value Range Comment   Glucose-Capillary 242 (*) 70 - 99 mg/dL   GLUCOSE, CAPILLARY     Status: Abnormal   Collection Time   03/24/12 11:52 AM      Component Value Range Comment   Glucose-Capillary 328 (*) 70 - 99 mg/dL   GLUCOSE, CAPILLARY     Status: Abnormal   Collection Time   03/24/12  5:16 PM      Component Value Range Comment   Glucose-Capillary 305 (*) 70 - 99 mg/dL     No results found.  Review of Systems  All other systems reviewed and are negative.   Blood pressure 128/80, pulse 70, temperature 98.6 F (37 C), temperature source Oral, resp. rate 19, height 5\' 10"  (1.778 m), weight 74.844 kg (165 lb), SpO2 97.00%. Physical Exam On examination patient has indurated skin with cellulitis and abscess. There is purulent drainage from the small stab incision. The area cellulitis is possibly half the size of his initial area of cellulitis. There is no streaking up the arm. Assessment/Plan: Assessment: Abscess left arm secondary to needle injection with the illegal drugs.  Plan: After informed consent and sterile prepping patient underwent a local anesthetic with 10 cc of 1% lidocaine plain after adequate levels of anesthesia were obtained the incision was enlarged to approximately 5 cm in length blunt dissection was carried down to the abscess it was irrigated cleansed and debrided. This is packed open with a 4 x 4 gauze a sterile dressing was applied plan for hydrotherapy starting tomorrow. This can be performed by the wound care nursing daily until the abscess has completely resolved. Patient needs aggressive glucose control management or I doubt this will ever resolve or heal.  Suzanna Zahn V 03/24/2012, 6:21 PM

## 2012-03-24 NOTE — Progress Notes (Signed)
[facility] PROGRESS NOTE  Charles Manning ZOX:[id number] DOB: Jun 13, 1955 DOA: 03/22/2012 PCP: Jackie Plum, MD  Assessment/Plan: 1. Left upper arm abscess & cellulitis--erythema receding further, but still suspect large abscess. Consulted [facility] 10/16 for further management but no response. Will consult on-call orthopedics today and confirm consult received (spoke with [place] at [facility] orthopedics [phone]). Continue empiric clindamycin. 2. DM type 2 uncontrolled--Labile. Consulted nutrition, RN DM coordinator. Hgb A1c 13.4. SSI. Continue Amaryl, insulin 70/30 post-procedure. 3. Hyponatremia--improved. Etiology unclear, suspect dehydration. IVF. 4. Suspected dehydration--appears resolved. 5. Depression/anxiety--stable. Continue Seroquel, Celexa, Buspar. 6. Chronic pain syndrome--stable, continue gabapentin.  Allow to eat breakfast, then NPO pending orthopedic consultation.  Code Status: full code Family Communication: none present Disposition Plan: home when improved  Brendia Sacks, MD  [facility] Team 6 Pager [phone]. If 8PM-8AM, please contact night-coverage at [email or web address], password [facility] 03/24/2012, 9:17 AM  LOS: 2 days   Brief narrative: 56 year old man presented with LUE cellulitis and abscess.  Consultants:  Orthopedics  Procedures:  10/16 I&D in ED  Antibiotics:  Clindamycin 10/16 >>  HPI/Subjective: No changes except some improvement in arm, though still painful.  Objective: Filed Vitals:   03/23/12 0856 03/23/12 1400 03/23/12 2156 03/24/12 0509  BP: 145/101 120/62 119/78 121/86  Pulse:  64 67 69  Temp:  97.6 F (36.4 C) 98.7 F (37.1 C) 98.2 F (36.8 C)  TempSrc:  Oral Oral Axillary  Resp:  20 20 20   Height:      Weight:      SpO2:  98% 97% 97%    Intake/Output Summary (Last 24 hours) at 03/24/12 0917 Last data filed at 03/24/12 0622  Gross per 24 hour  Intake    240 ml  Output   3775 ml  Net  -3535 ml    Filed Weights   03/23/12 0500  Weight: 74.844 kg (165 lb)    Exam:  General:  Appears calm and comfortable except with movement of left arm Cardiovascular: RRR, no m/r/g. No LE edema. Respiratory: CTA bilaterally, no w/r/r. Normal respiratory effort. Skin: left upper arm--marked borders noted, erythema has receded further. Incision packed, no drainage. Induration inner bicep inferior to incision, tender to touch, firmer today. Psychiatric: speech fluent and appropriate  Data Reviewed: Basic Metabolic Panel:  Lab 03/24/12 6213 03/22/12 2145  NA 134* 130*  K 4.7 4.4  CL 103 94*  CO2 24 25  GLUCOSE 298* 489*  BUN 14 24*  CREATININE 0.72 0.84  CALCIUM 8.9 9.4  MG -- --  PHOS -- --   CBC:  Lab 03/24/12 0400 03/23/12 0526 03/22/12 2145  WBC 18.1* 16.6* 18.8*  NEUTROABS -- -- 12.6*  HGB 13.8 12.9* 14.0  HCT 40.0 36.8* 39.5  MCV 88.9 87.6 87.8  PLT 407* 373 398   CBG:  Lab 03/24/12 0837 03/23/12 2152 03/23/12 1604 03/23/12 1214 03/23/12 0727  GLUCAP 242* 262* 176* 75 162*   Studies: No results found.  Scheduled Meds:    . busPIRone  10 mg Oral TID  . citalopram  20 mg Oral Daily  . clindamycin (CLEOCIN) IV  600 mg Intravenous Q8H  . fenofibrate  160 mg Oral Daily  . gabapentin  600 mg Oral BID  . influenza  inactive virus vaccine  0.5 mL Intramuscular Once  . insulin aspart  0-15 Units Subcutaneous TID WC  . lisinopril  10 mg Oral Daily  . meloxicam  15 mg Oral Daily  . naproxen  250 mg Oral BID WC  .  nicotine  21 mg Transdermal Once  . pneumococcal 23 valent vaccine  0.5 mL Intramuscular Once  . QUEtiapine  150 mg Oral Daily  . DISCONTD: glimepiride  4 mg Oral QAC breakfast  . DISCONTD: insulin aspart protamine-insulin aspart  50 Units Subcutaneous BID WC   Continuous Infusions:    . sodium chloride 50 mL (03/24/12 0522)    Principal Problem:  *Abscess of left arm Active Problems:  HTN (hypertension)  Diabetes mellitus type 2 with complications,  uncontrolled  Depression  Chronic pain syndrome  Hyponatremia  Dehydration     Brendia Sacks, MD  [facility] Team 6 Pager [phone]. If 8PM-8AM, please contact night-coverage at [email or web address], password [facility] 03/24/2012, 9:17 AM  LOS: 2 days   Time spent: 15 minutes

## 2012-03-25 DIAGNOSIS — G894 Chronic pain syndrome: Secondary | ICD-10-CM

## 2012-03-25 DIAGNOSIS — F1990 Other psychoactive substance use, unspecified, uncomplicated: Secondary | ICD-10-CM

## 2012-03-25 DIAGNOSIS — F1910 Other psychoactive substance abuse, uncomplicated: Secondary | ICD-10-CM

## 2012-03-25 LAB — CBC
HCT: 42.2 % (ref 39.0–52.0)
Hemoglobin: 14.6 g/dL (ref 13.0–17.0)
MCH: 30.9 pg (ref 26.0–34.0)
MCHC: 34.6 g/dL (ref 30.0–36.0)
MCV: 89.4 fL (ref 78.0–100.0)

## 2012-03-25 LAB — GLUCOSE, CAPILLARY
Glucose-Capillary: 359 mg/dL — ABNORMAL HIGH (ref 70–99)
Glucose-Capillary: 184 mg/dL — ABNORMAL HIGH (ref 70–99)

## 2012-03-25 LAB — WOUND CULTURE: Culture: NO GROWTH

## 2012-03-25 LAB — BASIC METABOLIC PANEL
BUN: 14 mg/dL (ref 6–23)
CO2: 23 mEq/L (ref 19–32)
GFR calc non Af Amer: >90 mL/min (ref >90)
Glucose, Bld: 264 mg/dL — ABNORMAL HIGH (ref 70–99)
Potassium: 4.1 mEq/L (ref 3.5–5.1)

## 2012-03-25 MED ORDER — INSULIN ASPART PROT & ASPART (70-30 MIX) 100 UNIT/ML ~~LOC~~ SUSP
30.0000 [IU] | Freq: Two times a day (BID) | SUBCUTANEOUS | Status: DC
  Administered 2012-03-25 – 2012-03-26 (×2): 30 [IU] via SUBCUTANEOUS
  Filled 2012-03-25: qty 3

## 2012-03-25 MED ORDER — OXYCODONE-ACETAMINOPHEN 5-325 MG PO TABS
1.0000 | ORAL_TABLET | ORAL | Status: DC | PRN
  Administered 2012-03-25 – 2012-03-26 (×6): 2 via ORAL
  Filled 2012-03-25 (×6): qty 2

## 2012-03-25 MED ORDER — GLIMEPIRIDE 4 MG PO TABS
4.0000 mg | ORAL_TABLET | Freq: Every day | ORAL | Status: DC
  Administered 2012-03-26 – 2012-03-30 (×5): 4 mg via ORAL
  Filled 2012-03-25 (×8): qty 1

## 2012-03-25 NOTE — Progress Notes (Signed)
HYDROTHERAPY EVALUATION  AND TREATMENT                03/25/12       [id number]  Subjective Assessment  Subjective Do what you have to do.  Patient and Family Stated Goals wound closure  Date of Onset 03/25/12  Prior Treatments surgical I and D 03/24/12  Evaluation and Treatment  Evaluation and Treatment Procedures Explained to Patient/Family Yes  Evaluation and Treatment Procedures agreed to  Wound 03/25/12 Other (Comment) Arm Left;Upper s/p Iand D abcess left biceps head  Date First Assessed/Time First Assessed: 03/25/12 1100   Wound Type: Other (Comment)  Location: Arm  Location Orientation: Left;Upper  Wound Description (Comments): s/p Iand D abcess left biceps head  Present on Admission: (c) Yes  Site / Wound Assessment Granulation tissue (unable to visualize complete wound bed)  % Wound base Red or Granulating (unable to visualize; proximal wound 90% granulation)  % Wound base Yellow (10%/unable to visualize; base full of pus)  Peri-wound Assessment Erythema (non-blanchable);Edema  Wound Length (cm) 3.2 cm  Wound Width (cm) 1.2 cm  Wound Depth (cm) 1.4 cm  Closure None  Drainage Amount Moderate  Drainage Description Purulent;No odor (old blood from surgery)  Non-staged Wound Description Not applicable  Treatment Cleansed;Hydrotherapy (Pulse lavage);Other (Comment) (milking of wound to remove purulent exudate)  Dressing Type Moist to dry;Gauze (Comment) (saline gauze, kerlix)  Dressing Changed Changed  Dressing Status Old drainage;New drainage  Hydrotherapy  Pulsed Lavage with Suction (psi) 8 psi  Pulsed Lavage with Suction - Normal Saline Used 1000 mL  Pulsed Lavage Tip Tip with splash shield  Pulsed lavage therapy - wound location Left upper arm-biceps  Wound Therapy - Assess/Plan/Recommendations  Wound Therapy - Clinical Statement pt will benefit from wound care to facilitate wound cleansing and debridement and eventual healing  Factors Delaying/Impairing Wound Healing  Diabetes Mellitus;Substance abuse  Hydrotherapy Plan Debridement;Dressing change;Patient/family education;Pulsatile lavage with suction  Wound Therapy - Frequency 6X / week  Wound Therapy - Follow Up Recommendations Home health RN  Wound Plan pulsatile lavage for wound debridement 6x/wk  Wound Therapy Goals - Improve the function of patient's integumentary system by progressing the wound(s) through the phases of wound healing by:  Decrease Length/Width/Depth by (cm) 0/0/.2  Decrease Length/Width/Depth - Progress Goal set today  Improve Drainage Characteristics Min  Improve Drainage Characteristics - Progress Goal set today  Patient/Family will be able to  verbalize/return demo dressing changes  Patient/Family Instruction Goal - Progress Goal set today  Additional Wound Therapy Goal pt will return demo AROM LUE to assist with blood flow/healing and prevent contracture  Additional Wound Therapy Goal - Progress Goal set today  Goals/treatment plan/discharge plan were made with and agreed upon by patient/family Yes  Time For Goal Achievement 7 days  Wound Therapy - Potential for Goals Good

## 2012-03-25 NOTE — Progress Notes (Signed)
[facility] PROGRESS NOTE  Charles Manning ZOX:[id number] DOB: 1955-12-30 DOA: 03/22/2012 PCP: Jackie Plum, MD  Assessment/Plan: 1. Left upper arm abscess & cellulitis--s/p I&D 10/17 by orthopedics. Slowly improving though WBC higher today. Initial culture unrevealing (on antibiotics). No repeat culture was sent yesterday. Hydrotherapy per orthopedics. 2. DM type 2 uncontrolled--Poor control. Resume 70/30 insulin, Amaryl; continue SSI. Hgb A1c 13.4.  3. Hyponatremia--stable, mild. Follow. Likely secondary to Celexa. 4. Suspected dehydration--appears resolved. 5. Depression/anxiety--stable. Continue Seroquel, Celexa, Buspar. 6. Chronic pain syndrome--stable, continue gabapentin. 7. IV drug use--CSW consult. Recommend cessation.  Code Status: full code Family Communication: none present Disposition Plan: home when improved  Brendia Sacks, MD  [facility] Team 6 Pager [phone]. If 8PM-8AM, please contact night-coverage at [email or web address], password [facility] 03/25/2012, 2:58 PM  LOS: 3 days   Brief narrative: 56 year old man presented with LUE cellulitis and abscess.  Consultants:  Orthopedics  Procedures:  10/16 I&D in ED  10/17 I&D at bedside by Dr. Lajoyce Corners.  Antibiotics:  Clindamycin 10/16 >>  HPI/Subjective: No changes except some improvement in arm, though still painful.  Objective: Filed Vitals:   03/25/12 0208 03/25/12 0615 03/25/12 0746 03/25/12 1400  BP: 110/72 129/74 125/76 103/65  Pulse: 63 66 71 61  Temp: 98.2 F (36.8 C) 97.8 F (36.6 C) 98.8 F (37.1 C) 97.5 F (36.4 C)  TempSrc: Oral Oral Oral Oral  Resp: 20 20 19 20   Height:      Weight:      SpO2: 97% 97% 97% 96%    Intake/Output Summary (Last 24 hours) at 03/25/12 1458 Last data filed at 03/25/12 1400  Gross per 24 hour  Intake   1280 ml  Output   4300 ml  Net  -3020 ml   Filed Weights   03/23/12 0500  Weight: 74.844 kg (165 lb)    Exam:  General:  Appears calm and  comfortable except with movement of left arm Cardiovascular: RRR, no m/r/g. No LE edema. Respiratory: CTA bilaterally, no w/r/r. Normal respiratory effort. Skin: left upper arm--marked borders noted, erythema has receded further. Incision packed, no drainage. Induration inner bicep inferior to incision, tender to touch, firmer today. Psychiatric: speech fluent and appropriate  Data Reviewed: Basic Metabolic Panel:  Lab 03/25/12 8295 03/24/12 0400 03/22/12 2145  NA 132* 134* 130*  K 4.1 4.7 4.4  CL 99 103 94*  CO2 23 24 25   GLUCOSE 264* 298* 489*  BUN 14 14 24*  CREATININE 0.72 0.72 0.84  CALCIUM 8.9 8.9 9.4  MG -- -- --  PHOS -- -- --   CBC:  Lab 03/25/12 0435 03/24/12 0400 03/23/12 0526 03/22/12 2145  WBC 24.6* 18.1* 16.6* 18.8*  NEUTROABS -- -- -- 12.6*  HGB 14.6 13.8 12.9* 14.0  HCT 42.2 40.0 36.8* 39.5  MCV 89.4 88.9 87.6 87.8  PLT 371 407* 373 398   CBG:  Lab 03/25/12 1141 03/25/12 0723 03/24/12 2231 03/24/12 1716 03/24/12 1152  GLUCAP 184* 359* 381* 305* 328*   Studies: No results found.  Scheduled Meds:    . busPIRone  10 mg Oral TID  . citalopram  20 mg Oral Daily  . clindamycin (CLEOCIN) IV  600 mg Intravenous Q8H  . fenofibrate  160 mg Oral Daily  . gabapentin  600 mg Oral BID  . insulin aspart  0-15 Units Subcutaneous TID WC  . insulin aspart protamine-insulin aspart  30 Units Subcutaneous BID WC  . lisinopril  10 mg Oral Daily  . nicotine  21 mg  Transdermal Daily  . QUEtiapine  150 mg Oral Daily  . DISCONTD: insulin glargine  10 Units Subcutaneous Daily   Continuous Infusions:    . DISCONTD: sodium chloride 50 mL (03/25/12 0433)    Principal Problem:  *Abscess of left arm Active Problems:  HTN (hypertension)  Diabetes mellitus type 2 with complications, uncontrolled  Depression  Chronic pain syndrome  Hyponatremia  Dehydration     Brendia Sacks, MD  [facility] Team 6 Pager [phone]. If 8PM-8AM, please contact  night-coverage at [email or web address], password [facility] 03/25/2012, 2:58 PM  LOS: 3 days   Time spent: 15 minutes

## 2012-03-26 LAB — BASIC METABOLIC PANEL
CO2: 25 mEq/L (ref 19–32)
Calcium: 9.2 mg/dL (ref 8.4–10.5)
Chloride: 94 mEq/L — ABNORMAL LOW (ref 96–112)
Creatinine, Ser: 0.8 mg/dL (ref 0.50–1.35)
Glucose, Bld: 324 mg/dL — ABNORMAL HIGH (ref 70–99)

## 2012-03-26 LAB — GLUCOSE, CAPILLARY
Glucose-Capillary: 282 mg/dL — ABNORMAL HIGH (ref 70–99)
Glucose-Capillary: 314 mg/dL — ABNORMAL HIGH (ref 70–99)

## 2012-03-26 LAB — CBC
HCT: 42.4 % (ref 39.0–52.0)
MCH: 30.9 pg (ref 26.0–34.0)
MCV: 89.8 fL (ref 78.0–100.0)
Platelets: 449 10*3/uL — ABNORMAL HIGH (ref 150–400)
RDW: 12.1 % (ref 11.5–15.5)

## 2012-03-26 MED ORDER — INSULIN ASPART 100 UNIT/ML ~~LOC~~ SOLN
0.0000 [IU] | Freq: Three times a day (TID) | SUBCUTANEOUS | Status: DC
  Administered 2012-03-26: 15 [IU] via SUBCUTANEOUS
  Administered 2012-03-26 – 2012-03-27 (×2): 4 [IU] via SUBCUTANEOUS
  Administered 2012-03-27 (×2): 7 [IU] via SUBCUTANEOUS
  Administered 2012-03-28: 15 [IU] via SUBCUTANEOUS
  Administered 2012-03-28: 4 [IU] via SUBCUTANEOUS
  Administered 2012-03-29: 11 [IU] via SUBCUTANEOUS
  Administered 2012-03-29: 7 [IU] via SUBCUTANEOUS
  Administered 2012-03-29: 4 [IU] via SUBCUTANEOUS
  Administered 2012-03-30: 7 [IU] via SUBCUTANEOUS
  Administered 2012-03-30: 4 [IU] via SUBCUTANEOUS
  Administered 2012-03-30: 7 [IU] via SUBCUTANEOUS
  Administered 2012-03-31: 11 [IU] via SUBCUTANEOUS
  Administered 2012-03-31: 15 [IU] via SUBCUTANEOUS

## 2012-03-26 MED ORDER — OXYCODONE-ACETAMINOPHEN 5-325 MG PO TABS
2.0000 | ORAL_TABLET | ORAL | Status: DC | PRN
  Administered 2012-03-26 – 2012-03-28 (×13): 2 via ORAL
  Filled 2012-03-26 (×13): qty 2

## 2012-03-26 MED ORDER — NICOTINE POLACRILEX 2 MG MT GUM
2.0000 mg | CHEWING_GUM | OROMUCOSAL | Status: DC | PRN
  Administered 2012-03-26 – 2012-03-31 (×29): 2 mg via ORAL
  Filled 2012-03-26 (×15): qty 1

## 2012-03-26 MED ORDER — VANCOMYCIN HCL IN DEXTROSE 1-5 GM/200ML-% IV SOLN
1000.0000 mg | INTRAVENOUS | Status: AC
  Administered 2012-03-26: 1000 mg via INTRAVENOUS
  Filled 2012-03-26: qty 200

## 2012-03-26 MED ORDER — INSULIN GLARGINE 100 UNIT/ML ~~LOC~~ SOLN
40.0000 [IU] | Freq: Every day | SUBCUTANEOUS | Status: DC
  Administered 2012-03-26: 40 [IU] via SUBCUTANEOUS

## 2012-03-26 MED ORDER — DIPHENHYDRAMINE HCL 25 MG PO CAPS
25.0000 mg | ORAL_CAPSULE | Freq: Four times a day (QID) | ORAL | Status: DC | PRN
  Administered 2012-03-26 – 2012-03-28 (×3): 25 mg via ORAL
  Filled 2012-03-26 (×3): qty 1

## 2012-03-26 MED ORDER — INSULIN ASPART 100 UNIT/ML ~~LOC~~ SOLN
0.0000 [IU] | Freq: Every day | SUBCUTANEOUS | Status: DC
  Administered 2012-03-26: 2 [IU] via SUBCUTANEOUS
  Administered 2012-03-27 – 2012-03-28 (×2): 3 [IU] via SUBCUTANEOUS
  Administered 2012-03-29 – 2012-03-30 (×2): 2 [IU] via SUBCUTANEOUS

## 2012-03-26 MED ORDER — VANCOMYCIN HCL IN DEXTROSE 1-5 GM/200ML-% IV SOLN
1000.0000 mg | Freq: Three times a day (TID) | INTRAVENOUS | Status: DC
  Administered 2012-03-26 – 2012-03-29 (×8): 1000 mg via INTRAVENOUS
  Filled 2012-03-26 (×9): qty 200

## 2012-03-26 MED ORDER — INSULIN ASPART 100 UNIT/ML ~~LOC~~ SOLN
4.0000 [IU] | Freq: Three times a day (TID) | SUBCUTANEOUS | Status: DC
  Administered 2012-03-26 – 2012-03-30 (×13): 4 [IU] via SUBCUTANEOUS

## 2012-03-26 NOTE — Progress Notes (Signed)
Hydrotherapy Note 03/26/12 1443  Subjective Assessment  Subjective Pt stated he was ready and it was not too painful today unless you push on the medial redenned area of the left arm.  He stated he removed tha dressing this morning because it was draining out the bottom, and he went to the sink to wash it out and push the pus out.  I informed him that this was not a good idea and that it will anger the tissue areas.    Evaluation and Treatment  Evaluation and Treatment Procedures Explained to Patient/Family Yes  Evaluation and Treatment Procedures agreed to  Wound 03/25/12 Other (Comment) Arm Left;Upper s/p Iand D abcess left biceps head  Date First Assessed/Time First Assessed: 03/25/12 1100   Wound Type: Other (Comment)  Location: Arm  Location Orientation: Left;Upper  Wound Description (Comments): s/p Iand D abcess left biceps head  Present on Admission: (c) Yes  Site / Wound Assessment Granulation tissue (unable to visualize complete wound bed)  % Wound base Red or Granulating (unable to visualize; proximal wound 90% granulation)  % Wound base Yellow (10%/unable to visualize; base full of pus)  Peri-wound Assessment Erythema (non-blanchable);Edema  Closure None  Drainage Amount Moderate  Drainage Description Purulent;No odor (old blood from surgery)  Non-staged Wound Description Not applicable  Treatment Cleansed;Debridement (Selective);Hydrotherapy (Pulse lavage);Packing (Saline gauze)  Dressing Type Moist to dry;Gauze (Comment) (saline gauze, kerlix)  Dressing Changed Changed  Dressing Status Old drainage;New drainage  Hydrotherapy  Pulsed Lavage with Suction (psi) 8 psi (varied from 4-12 as tolerated)  Pulsed Lavage with Suction - Normal Saline Used 1000 mL  Pulsed Lavage Tip Tip with splash shield  Pulsed lavage therapy - wound location Left upper arm-biceps  Selective Debridement  Selective Debridement - Location Left arm incision area  Selective Debridement - Tools Used  Forceps  Selective Debridement - Tissue Removed yellow slough removed from upper larger incision area and also from small hole about 1cm in diameter on medial more distal area.  Wound Therapy - Assess/Plan/Recommendations  Wound Therapy - Clinical Statement Pt tolerated well today and was able to remove pus and drainage with fgentle squeezing and also some yellow slough from both areas.   Factors Delaying/Impairing Wound Healing Diabetes Mellitus  Hydrotherapy Plan Debridement;Dressing change;Patient/family education;Pulsatile lavage with suction  Wound Therapy Goals - Improve the function of patient's integumentary system by progressing the wound(s) through the phases of wound healing by:  Decrease Length/Width/Depth - Progress Progressing toward goal  Improve Drainage Characteristics - Progress Progressing toward goal

## 2012-03-26 NOTE — Progress Notes (Signed)
ANTIBIOTIC CONSULT NOTE - INITIAL  Pharmacy Consult for Vancomycin Indication: Left upper arm abscess and cellulitis  Allergies  Allergen Reactions  . Vancomycin Itching    Patient Measurements: Height: 5\' 10"  (177.8 cm) Weight: 165 lb (74.844 kg) IBW/kg (Calculated) : 73    Vital Signs: Temp: 99.3 F (37.4 C) (10/19 0520) Temp src: Oral (10/19 0520) BP: 130/70 mmHg (10/19 1008) Pulse Rate: 71  (10/19 0520) Intake/Output from previous day: 10/18 0701 - 10/19 0700 In: 960 [P.O.:960] Out: 3670 [Urine:3670] Intake/Output from this shift: Total I/O In: 240 [P.O.:240] Out: 0   Labs:  [facility] 03/26/12 0417 03/25/12 0435 03/24/12 0400  WBC 24.8* 24.6* 18.1*  HGB 14.6 14.6 13.8  PLT 449* 371 407*  LABCREA -- -- --  CREATININE 0.80 0.72 0.72   Estimated Creatinine Clearance: 109 ml/min (by C-G formula based on Cr of 0.8). No results found for this basename: VANCOTROUGH:2,VANCOPEAK:2,VANCORANDOM:2,GENTTROUGH:2,GENTPEAK:2,GENTRANDOM:2,TOBRATROUGH:2,TOBRAPEAK:2,TOBRARND:2,AMIKACINPEAK:2,AMIKACINTROU:2,AMIKACIN:2, in the last 72 hours   Microbiology: Recent Results (from the past 720 hour(s))  WOUND CULTURE     Status: Normal   Collection Time   03/23/12  4:08 AM      Component Value Range Status Comment   Specimen Description ARM   Final    Special Requests NONE   Final    Gram Stain     Final    Value: RARE WBC PRESENT, PREDOMINANTLY PMN     NO SQUAMOUS EPITHELIAL CELLS SEEN     NO ORGANISMS SEEN   Culture NO GROWTH 2 DAYS   Final    Report Status 03/25/2012 FINAL   Final     Medical History: Past Medical History  Diagnosis Date  . HTN (hypertension) 09/25/2011  . DM (diabetes mellitus) 09/25/2011  . Degenerative joint disease 09/25/2011  . Depression 09/25/2011  . Anxiety 09/25/2011  . Chronic pain syndrome 09/25/2011  . Basal cell cancer 09/25/2011    Mid back 2007  . Melanoma 09/25/2011    Right thigh 2005    Medications:  Prescriptions prior to admission    Medication Sig Dispense Refill  . busPIRone (BUSPAR) 10 MG tablet Take 1 tablet (10 mg total) by mouth 3 (three) times daily.  90 tablet  0  . citalopram (CELEXA) 20 MG tablet Take 1 tablet (20 mg total) by mouth daily.  30 tablet  0  . fenofibrate 160 MG tablet Take 1 tablet (160 mg total) by mouth daily.  30 tablet  0  . gabapentin (NEURONTIN) 600 MG tablet Take 600 mg by mouth 2 (two) times daily.      Marland Kitchen glimepiride (AMARYL) 4 MG tablet Take 4 mg by mouth daily before breakfast.      . HYDROcodone-acetaminophen (NORCO/VICODIN) 5-325 MG per tablet Take 1 tablet by mouth every 6 (six) hours as needed. For pain      . insulin aspart protamine-insulin aspart (NOVOLOG 70/30) (70-30) 100 UNIT/ML injection Inject 50 Units into the skin 2 (two) times daily with a meal.      . insulin glargine (LANTUS) 100 UNIT/ML injection Inject 40 Units into the skin at bedtime.      Marland Kitchen lisinopril (PRINIVIL,ZESTRIL) 10 MG tablet Take 10 mg by mouth daily.      . meloxicam (MOBIC) 15 MG tablet Take 15 mg by mouth daily.      . naproxen (NAPROSYN) 250 MG tablet Take 250 mg by mouth 2 (two) times daily with a meal.      . QUEtiapine (SEROQUEL XR) 150 MG 24 hr tablet Take  1 tablet (150 mg total) by mouth daily.  30 each  0  . traMADol (ULTRAM) 50 MG tablet Take 50 mg by mouth every 8 (eight) hours as needed. For pain      . traZODone (DESYREL) 150 MG tablet Take 150 mg by mouth at bedtime.       Scheduled:    . busPIRone  10 mg Oral TID  . citalopram  20 mg Oral Daily  . fenofibrate  160 mg Oral Daily  . gabapentin  600 mg Oral BID  . glimepiride  4 mg Oral QAC breakfast  . insulin aspart  0-20 Units Subcutaneous TID WC  . insulin aspart  0-5 Units Subcutaneous QHS  . insulin aspart  4 Units Subcutaneous TID WC  . insulin glargine  40 Units Subcutaneous QHS  . lisinopril  10 mg Oral Daily  . QUEtiapine  150 mg Oral Daily  . vancomycin  1,000 mg Intravenous NOW  . vancomycin  1,000 mg Intravenous Q8H  .  DISCONTD: clindamycin (CLEOCIN) IV  600 mg Intravenous Q8H  . DISCONTD: insulin aspart  0-15 Units Subcutaneous TID WC  . DISCONTD: insulin aspart protamine-insulin aspart  30 Units Subcutaneous BID WC  . DISCONTD: nicotine  21 mg Transdermal Daily   Infusions:   Anti-infectives     Start     Dose/Rate Route Frequency Ordered Stop   03/26/12 2000   vancomycin (VANCOCIN) IVPB 1000 mg/200 mL premix        1,000 mg 200 mL/hr over 60 Minutes Intravenous Every 8 hours 03/26/12 1056     03/26/12 1100   vancomycin (VANCOCIN) IVPB 1000 mg/200 mL premix        1,000 mg 200 mL/hr over 60 Minutes Intravenous NOW 03/26/12 1056 03/27/12 1100   03/23/12 0600   clindamycin (CLEOCIN) IVPB 600 mg  Status:  Discontinued        600 mg 100 mL/hr over 30 Minutes Intravenous 3 times per day 03/23/12 0357 03/26/12 1047   03/23/12 0115   clindamycin (CLEOCIN) IVPB 600 mg        600 mg 100 mL/hr over 30 Minutes Intravenous  Once 03/23/12 0111 03/23/12 0158         Assessment: 56 YOM to start Vancomycin per pharmacy for left upper arm abscess and cellulitis.   Previously on clindamycin (10/16 - 10/19)  S/P I&D on 10/17 with not significant improvement  WBC elevated = 24.8   Wound Culture 10/16: No growth (patient was on abx)  MD aware of intolerance (itching) with vancomycin - per MD will order some Benadryl   Goal of Therapy:  Vancomycin trough level 15-20 mcg/ml  Plan:  1. Start Vancomycin 1 g IV q 8 hours now. 2. Trough at steady state 3. Will follow renal function and adjust accordingly  Quetzaly Ebner, Lorra Hals, PharmD [phone] 03/26/2012,11:00 AM

## 2012-03-26 NOTE — Consult Note (Signed)
  Day 2 after I&D of left arm abscess.  Cellulitis improved but still has area of red indurated skin.  Will follow may need repeat I&D.  Patient is belligerent regarding suggestions of glucose control and proper diet.

## 2012-03-26 NOTE — Progress Notes (Signed)
[facility] PROGRESS NOTE  Charles Manning [id number] DOB: Dec 10, 1955 DOA: 03/22/2012 PCP: Jackie Plum, MD  Assessment/Plan: 1. Left upper arm abscess & cellulitis--s/p I&D 10/17 by orthopedics. No significant improvement. WBC remains high. Initial culture unrevealing (on antibiotics). No repeat culture was sent yesterday. Hydrotherapy per orthopedics. May require repeat I & D. Discussed with Dr. Daiva Eves ID, recommends d/c clindamycin and start vancomycin. Consider imaging. Does not suggest broader abx at this point. 2. DM type 2 uncontrolled--Poor control. Patient would rather resume Lantus (has been on 12 years) rather than recently started 70/30 with vial and syringe. Continue Amaryl; continue SSI. add meal coverage. Hgb A1c 13.4.  3. Hyponatremia--stable, mild. Follow. Likely secondary to Celexa. 4. Depression/anxiety--stable. Continue Seroquel, Celexa, Buspar. 5. Chronic pain syndrome--stable, continue gabapentin. 6. IV drug use--patient admits. He does not want to see CSW ("it would ruin my day"). Recommend cessation.  Code Status: full code Family Communication: none present Disposition Plan: home when improved  Brendia Sacks, MD  [facility] Team 6 Pager [phone]. If 8PM-8AM, please contact night-coverage at [email or web address], password [facility] 03/26/2012, 10:40 AM  LOS: 4 days   Brief narrative: 56 year old man presented with LUE cellulitis and abscess.  Consultants:  Orthopedics  Procedures:  10/16 I&D in ED  10/17 I&D at bedside by Dr. Lajoyce Corners.  Antibiotics:  Clindamycin 10/16 >>  HPI/Subjective: Still has purulent discharge from arm wound and significant pain. Eating ok.  Objective: Filed Vitals:   03/25/12 1800 03/25/12 2120 03/26/12 0520 03/26/12 1008  BP: 107/64 130/68 124/74 130/70  Pulse: 67 77 71   Temp: 98.7 F (37.1 C) 99.3 F (37.4 C) 99.3 F (37.4 C)   TempSrc: Oral Oral Oral   Resp: 20 18 18    Height:      Weight:      SpO2:  96% 97% 96%     Intake/Output Summary (Last 24 hours) at 03/26/12 1040 Last data filed at 03/26/12 1000  Gross per 24 hour  Intake    960 ml  Output   3670 ml  Net  -2710 ml   Filed Weights   03/23/12 0500  Weight: 74.844 kg (165 lb)    Exam:  General:  Appears calm and comfortable  Cardiovascular: RRR, no m/r/g. No LE edema. Respiratory: CTA bilaterally, no w/r/r. Normal respiratory effort. Skin: left upper arm--marked borders noted, erythema without change. Significant exudate noted. Induration inner bicep inferior to incision, tender to touch. Psychiatric: speech fluent and appropriate  Data Reviewed: Basic Metabolic Panel:  Lab 03/26/12 3235 03/25/12 0435 03/24/12 0400 03/22/12 2145  NA 131* 132* 134* 130*  K 4.0 4.1 4.7 4.4  CL 94* 99 103 94*  CO2 25 23 24 25   GLUCOSE 324* 264* 298* 489*  BUN 16 14 14  24*  CREATININE 0.80 0.72 0.72 0.84  CALCIUM 9.2 8.9 8.9 9.4  MG -- -- -- --  PHOS -- -- -- --   CBC:  Lab 03/26/12 0417 03/25/12 0435 03/24/12 0400 03/23/12 0526 03/22/12 2145  WBC 24.8* 24.6* 18.1* 16.6* 18.8*  NEUTROABS -- -- -- -- 12.6*  HGB 14.6 14.6 13.8 12.9* 14.0  HCT 42.4 42.2 40.0 36.8* 39.5  MCV 89.8 89.4 88.9 87.6 87.8  PLT 449* 371 407* 373 398   CBG:  Lab 03/26/12 0702 03/25/12 2205 03/25/12 1550 03/25/12 1141 03/25/12 0723  GLUCAP 282* 386* 290* 184* 359*   Studies: No results found.  Scheduled Meds:    . busPIRone  10 mg Oral TID  . citalopram  20 mg Oral Daily  . clindamycin (CLEOCIN) IV  600 mg Intravenous Q8H  . fenofibrate  160 mg Oral Daily  . gabapentin  600 mg Oral BID  . glimepiride  4 mg Oral QAC breakfast  . insulin aspart  0-15 Units Subcutaneous TID WC  . insulin aspart protamine-insulin aspart  30 Units Subcutaneous BID WC  . lisinopril  10 mg Oral Daily  . QUEtiapine  150 mg Oral Daily  . DISCONTD: nicotine  21 mg Transdermal Daily   Continuous Infusions:    Principal Problem:  *Abscess of left arm Active  Problems:  HTN (hypertension)  Diabetes mellitus type 2 with complications, uncontrolled  Depression  Chronic pain syndrome  Hyponatremia  Dehydration  IV drug user     Brendia Sacks, MD  [facility] Team 6 Pager [phone]. If 8PM-8AM, please contact night-coverage at [email or web address], password [facility] 03/26/2012, 10:40 AM  LOS: 4 days   Time spent: 20 minutes

## 2012-03-27 LAB — BASIC METABOLIC PANEL
BUN: 14 mg/dL (ref 6–23)
Calcium: 9.1 mg/dL (ref 8.4–10.5)
Chloride: 98 mEq/L (ref 96–112)
Creatinine, Ser: 0.84 mg/dL (ref 0.50–1.35)
GFR calc Af Amer: >90 mL/min (ref >90)

## 2012-03-27 LAB — GLUCOSE, CAPILLARY
Glucose-Capillary: 223 mg/dL — ABNORMAL HIGH (ref 70–99)
Glucose-Capillary: 218 mg/dL — ABNORMAL HIGH (ref 70–99)
Glucose-Capillary: 298 mg/dL — ABNORMAL HIGH (ref 70–99)

## 2012-03-27 LAB — CBC
HCT: 40.4 % (ref 39.0–52.0)
MCHC: 34.4 g/dL (ref 30.0–36.0)
Platelets: 418 10*3/uL — ABNORMAL HIGH (ref 150–400)
RDW: 12.1 % (ref 11.5–15.5)

## 2012-03-27 MED ORDER — INSULIN GLARGINE 100 UNIT/ML ~~LOC~~ SOLN
50.0000 [IU] | Freq: Every day | SUBCUTANEOUS | Status: DC
  Administered 2012-03-27 – 2012-03-29 (×3): 50 [IU] via SUBCUTANEOUS

## 2012-03-27 NOTE — Progress Notes (Signed)
[facility] PROGRESS NOTE  Charles Manning [id number]:[id number] DOB: September 14, 1955 DOA: 03/22/2012 PCP: Jackie Plum, MD  Assessment/Plan: 1. Left upper arm abscess & cellulitis--improving, decreased WBC, less exudate. Continue vancomycin, daily CBC, wound care. S/p I&D 10/17 by orthopedics.  2. DM type 2 uncontrolled--somewhat improved with resumption of Lantus. Increase Lantus; continue Amaryl, SSI, meal coverage. Hgb A1c 13.4.  3. Hyponatremia--stable, mild, asymptomatic. Likely secondary to Celexa. 4. Depression/anxiety--stable. Continue Seroquel, Celexa, Buspar. 5. Chronic pain syndrome--stable, continue gabapentin. 6. IV drug use--patient admits. Declines CSW assistance. Recommend cessation.  Follow-up HIV screen.  Code Status: full code Family Communication: none present Disposition Plan: home when improved  Brendia Sacks, MD  [facility] Team 6 Pager [phone]. If 8PM-8AM, please contact night-coverage at [email or web address], password [email or web address] 03/27/2012, 1:25 PM  LOS: 5 days   Brief narrative: 56 year old man presented with LUE cellulitis and abscess.  Consultants:  Orthopedics  Procedures:  10/16 I&D in ED  10/17 I&D at bedside by Dr. Lajoyce Corners.  Antibiotics:  Clindamycin 10/16 >>10/19  Vancomycin 10/19 >>  HPI/Subjective: Better today, less drainage, less erythema and pain.  Objective: Filed Vitals:   03/26/12 1400 03/26/12 2215 03/27/12 0500 03/27/12 0950  BP: 115/90 110/73 133/81 148/77  Pulse: 61 56 62   Temp: 98.6 F (37 C) 98.8 F (37.1 C) 98.6 F (37 C)   TempSrc: Oral Oral Oral   Resp: 16 18 20    Height:      Weight:      SpO2: 94% 97% 95%     Intake/Output Summary (Last 24 hours) at 03/27/12 1325 Last data filed at 03/27/12 1000  Gross per 24 hour  Intake   1320 ml  Output   4000 ml  Net  -2680 ml   Filed Weights   03/23/12 0500  Weight: 74.844 kg (165 lb)    Exam:  General:  Appears calm and comfortable  Cardiovascular: RRR,  no m/r/g. Respiratory: CTA bilaterally, no w/r/r. Normal respiratory effort. Skin: left upper arm--marked borders noted, erythema resolving. No significant exudate noted. Induration inner bicep inferior to incision is softer, less erythematous/tender. Psychiatric: speech fluent and appropriate  Data Reviewed: Basic Metabolic Panel:  Lab 03/27/12 3086 03/26/12 0417 03/25/12 0435 03/24/12 0400 03/22/12 2145  NA 132* 131* 132* 134* 130*  K 4.0 4.0 4.1 4.7 4.4  CL 98 94* 99 103 94*  CO2 25 25 23 24 25   GLUCOSE 185* 324* 264* 298* 489*  BUN 14 16 14 14  24*  CREATININE 0.84 0.80 0.72 0.72 0.84  CALCIUM 9.1 9.2 8.9 8.9 9.4  MG -- -- -- -- --  PHOS -- -- -- -- --   CBC:  Lab 03/27/12 0504 03/26/12 0417 03/25/12 0435 03/24/12 0400 03/23/12 0526 03/22/12 2145  WBC 20.9* 24.8* 24.6* 18.1* 16.6* --  NEUTROABS -- -- -- -- -- 12.6*  HGB 13.9 14.6 14.6 13.8 12.9* --  HCT 40.4 42.4 42.2 40.0 36.8* --  MCV 88.8 89.8 89.4 88.9 87.6 --  PLT 418* 449* 371 407* 373 --   CBG:  Lab 03/27/12 1152 03/27/12 0717 03/26/12 2213 03/26/12 1739 03/26/12 1135  GLUCAP 223* 178* 214* 314* 193*   Studies: No results found.  Scheduled Meds:    . busPIRone  10 mg Oral TID  . citalopram  20 mg Oral Daily  . fenofibrate  160 mg Oral Daily  . gabapentin  600 mg Oral BID  . glimepiride  4 mg Oral QAC breakfast  . insulin aspart  0-20 Units Subcutaneous TID  WC  . insulin aspart  0-5 Units Subcutaneous QHS  . insulin aspart  4 Units Subcutaneous TID WC  . insulin glargine  40 Units Subcutaneous QHS  . lisinopril  10 mg Oral Daily  . QUEtiapine  150 mg Oral Daily  . vancomycin  1,000 mg Intravenous Q8H   Continuous Infusions:    Principal Problem:  *Abscess of left arm Active Problems:  HTN (hypertension)  Diabetes mellitus type 2 with complications, uncontrolled  Depression  Chronic pain syndrome  Hyponatremia  Dehydration  IV drug user     Brendia Sacks, MD  [facility] Team  6 Pager [phone]. If 8PM-8AM, please contact night-coverage at [email or web address], password [facility] 03/27/2012, 1:25 PM  LOS: 5 days   Time spent: 15 minutes

## 2012-03-27 NOTE — Progress Notes (Signed)
During rounding, in to speak with patient concerning I&D site.  Patient reported by nurse tech to be standing at sink with dressing removed and squeezing fluid from site.  Redness and edema noted to left upper arm area.  Packing intact with outer dressing removed.  Instructed patient on risk of infection and MD orders concerning dressing changes.  Patient verbalizes understanding, patient states, "i can't just sit around here and wait 24 hrs on a doctor to decide to do something." Patient instructed on risk of continuing to remove dressing and push and poke in incision site.  Patient non-complaint with care of I&D site.  Patient agreed to allow nursing to re-wrap site.  Site wrapped with kerlex dressing.  Patient encouraged to speak with MD concerning site area and questions concerning plan of care.  Patients current plan of care discussed.  Patient denies questions at this time.  Call light within reach.

## 2012-03-28 LAB — BASIC METABOLIC PANEL
BUN: 17 mg/dL (ref 6–23)
Chloride: 99 mEq/L (ref 96–112)
GFR calc Af Amer: >90 mL/min (ref >90)
Potassium: 3.9 mEq/L (ref 3.5–5.1)

## 2012-03-28 LAB — GLUCOSE, CAPILLARY
Glucose-Capillary: 195 mg/dL — ABNORMAL HIGH (ref 70–99)
Glucose-Capillary: 113 mg/dL — ABNORMAL HIGH (ref 70–99)
Glucose-Capillary: 333 mg/dL — ABNORMAL HIGH (ref 70–99)

## 2012-03-28 LAB — CBC
HCT: 39.3 % (ref 39.0–52.0)
Hemoglobin: 13.5 g/dL (ref 13.0–17.0)
WBC: 14.7 10*3/uL — ABNORMAL HIGH (ref 4.0–10.5)

## 2012-03-28 LAB — VANCOMYCIN, TROUGH: Vancomycin Tr: 10 ug/mL (ref 10.0–20.0)

## 2012-03-28 MED ORDER — KETOROLAC TROMETHAMINE 30 MG/ML IJ SOLN
30.0000 mg | Freq: Once | INTRAMUSCULAR | Status: AC
  Administered 2012-03-28: 30 mg via INTRAVENOUS
  Filled 2012-03-28: qty 1

## 2012-03-28 MED ORDER — OXYCODONE-ACETAMINOPHEN 5-325 MG PO TABS
1.0000 | ORAL_TABLET | ORAL | Status: DC | PRN
  Administered 2012-03-28 – 2012-03-31 (×15): 1 via ORAL
  Filled 2012-03-28 (×17): qty 1

## 2012-03-28 NOTE — Progress Notes (Signed)
ANTIBIOTIC CONSULT NOTE   Pharmacy Consult for Vancomycin Indication: Left upper arm abscess and cellulitis  Allergies  Allergen Reactions  . Vancomycin Itching    Patient Measurements: Height: 5\' 10"  (177.8 cm) Weight: 165 lb (74.844 kg) IBW/kg (Calculated) : 73    Vital Signs: Temp: 98.4 F (36.9 C) (10/21 1030) Temp src: Oral (10/21 1030) BP: 129/83 mmHg (10/21 1030) Pulse Rate: 60  (10/21 1030) Intake/Output from previous day: 10/20 0701 - 10/21 0700 In: 1880 [P.O.:1200; IV Piggyback:600] Out: 3700 [Urine:3700] Intake/Output from this shift: Total I/O In: 240 [P.O.:240] Out: 920 [Urine:920]  Labs:  [facility] 03/28/12 0355 03/27/12 0504 03/26/12 0417  WBC 14.7* 20.9* 24.8*  HGB 13.5 13.9 14.6  PLT 431* 418* 449*  LABCREA -- -- --  CREATININE 0.84 0.84 0.80   Estimated Creatinine Clearance: 103.8 ml/min (by C-G formula based on Cr of 0.84).  Basename 03/28/12 1142  VANCOTROUGH 10.0  VANCOPEAK --  VANCORANDOM --  GENTTROUGH --  GENTPEAK --  GENTRANDOM --  TOBRATROUGH --  TOBRAPEAK --  TOBRARND --  AMIKACINPEAK --  AMIKACINTROU --  AMIKACIN --     Microbiology: Recent Results (from the past 720 hour(s))  WOUND CULTURE     Status: Normal   Collection Time   03/23/12  4:08 AM      Component Value Range Status Comment   Specimen Description ARM   Final    Special Requests NONE   Final    Gram Stain     Final    Value: RARE WBC PRESENT, PREDOMINANTLY PMN     NO SQUAMOUS EPITHELIAL CELLS SEEN     NO ORGANISMS SEEN   Culture NO GROWTH 2 DAYS   Final    Report Status 03/25/2012 FINAL   Final     Medical History: Past Medical History  Diagnosis Date  . HTN (hypertension) 09/25/2011  . DM (diabetes mellitus) 09/25/2011  . Degenerative joint disease 09/25/2011  . Depression 09/25/2011  . Anxiety 09/25/2011  . Chronic pain syndrome 09/25/2011  . Basal cell cancer 09/25/2011    Mid back 2007  . Melanoma 09/25/2011    Right thigh 2005    Medications:    Prescriptions prior to admission  Medication Sig Dispense Refill  . busPIRone (BUSPAR) 10 MG tablet Take 1 tablet (10 mg total) by mouth 3 (three) times daily.  90 tablet  0  . citalopram (CELEXA) 20 MG tablet Take 1 tablet (20 mg total) by mouth daily.  30 tablet  0  . fenofibrate 160 MG tablet Take 1 tablet (160 mg total) by mouth daily.  30 tablet  0  . gabapentin (NEURONTIN) 600 MG tablet Take 600 mg by mouth 2 (two) times daily.      Marland Kitchen glimepiride (AMARYL) 4 MG tablet Take 4 mg by mouth daily before breakfast.      . HYDROcodone-acetaminophen (NORCO/VICODIN) 5-325 MG per tablet Take 1 tablet by mouth every 6 (six) hours as needed. For pain      . insulin aspart protamine-insulin aspart (NOVOLOG 70/30) (70-30) 100 UNIT/ML injection Inject 50 Units into the skin 2 (two) times daily with a meal.      . insulin glargine (LANTUS) 100 UNIT/ML injection Inject 40 Units into the skin at bedtime.      Marland Kitchen lisinopril (PRINIVIL,ZESTRIL) 10 MG tablet Take 10 mg by mouth daily.      . meloxicam (MOBIC) 15 MG tablet Take 15 mg by mouth daily.      . naproxen (  NAPROSYN) 250 MG tablet Take 250 mg by mouth 2 (two) times daily with a meal.      . QUEtiapine (SEROQUEL XR) 150 MG 24 hr tablet Take 1 tablet (150 mg total) by mouth daily.  30 each  0  . traMADol (ULTRAM) 50 MG tablet Take 50 mg by mouth every 8 (eight) hours as needed. For pain      . traZODone (DESYREL) 150 MG tablet Take 150 mg by mouth at bedtime.       Scheduled:     . busPIRone  10 mg Oral TID  . citalopram  20 mg Oral Daily  . fenofibrate  160 mg Oral Daily  . gabapentin  600 mg Oral BID  . glimepiride  4 mg Oral QAC breakfast  . insulin aspart  0-20 Units Subcutaneous TID WC  . insulin aspart  0-5 Units Subcutaneous QHS  . insulin aspart  4 Units Subcutaneous TID WC  . insulin glargine  50 Units Subcutaneous QHS  . lisinopril  10 mg Oral Daily  . QUEtiapine  150 mg Oral Daily  . vancomycin  1,000 mg Intravenous Q8H  . DISCONTD:  insulin glargine  40 Units Subcutaneous QHS   Infusions:   Anti-infectives     Start     Dose/Rate Route Frequency Ordered Stop   03/26/12 2000   vancomycin (VANCOCIN) IVPB 1000 mg/200 mL premix        1,000 mg 200 mL/hr over 60 Minutes Intravenous Every 8 hours 03/26/12 1056     03/26/12 1100   vancomycin (VANCOCIN) IVPB 1000 mg/200 mL premix        1,000 mg 200 mL/hr over 60 Minutes Intravenous NOW 03/26/12 1056 03/26/12 1255   03/23/12 0600   clindamycin (CLEOCIN) IVPB 600 mg  Status:  Discontinued        600 mg 100 mL/hr over 30 Minutes Intravenous 3 times per day 03/23/12 0357 03/26/12 1047   03/23/12 0115   clindamycin (CLEOCIN) IVPB 600 mg        600 mg 100 mL/hr over 30 Minutes Intravenous  Once 03/23/12 0111 03/23/12 0158         Assessment:  56 y/o M on D#3 Vancomycin 1 gram IV q8h for cellulitis and abscess L arm at site of illegal drug injection.  SCr stable.   Wound culture from arm abscess: no growth.  Vancomycin trough = 10.  Although this is below the previously stated goal of 15-20, clinical improvement (falling WBC, lessening exudate) suggests current dosage is sufficient following I&D.   Using a higher dosage would increase risk for nephrotoxicity and does not appear warranted at present.  Goal of Therapy:  Eradication of infection  Plan:  1. Continue Vancomycin 1 g IV q 8 hours 2. Follow serum creatinine.   Hope Budds, PharmD, BCPS Pager: [phone] 03/28/2012,12:54 PM

## 2012-03-28 NOTE — Progress Notes (Signed)
03/28/12 1100  Subjective Assessment  Subjective Pt states "It doesn't hurt anymore"  Evaluation and Treatment  Evaluation and Treatment Procedures Explained to Patient/Family Yes  Evaluation and Treatment Procedures agreed to  Wound 03/25/12 Other (Comment) Arm Left;Upper s/p Iand D abcess left biceps head  Date First Assessed/Time First Assessed: 03/25/12 1100   Wound Type: Other (Comment)  Location: Arm  Location Orientation: Left;Upper  Wound Description (Comments): s/p Iand D abcess left biceps head  Present on Admission: (c) Yes  Site / Wound Assessment Granulation tissue (unable to visualize complete wound bed)  % Wound base Red or Granulating (unable to visualize; proximal wound 90% granulation)  % Wound base Yellow (10%/unable to visualize; base full of pus)  Peri-wound Assessment Erythema (non-blanchable);Edema  Closure None  Drainage Amount Moderate  Drainage Description Purulent;No odor (old blood from surgery)  Non-staged Wound Description Not applicable  Dressing Type Moist to dry;Gauze (Comment) (saline gauze, kerlix)  Dressing Status Old drainage;New drainage  Incision 01/19/12 Arm Left;Upper  Date First Assessed: 01/19/12   Location: Arm  Location Orientation: Left;Upper  Present on Admission: Yes  Site / Wound Assessment Pink;Red   Felecia Shelling  PTA [facility]  Acute  Rehab Pager     [phone]

## 2012-03-28 NOTE — Progress Notes (Deleted)
Charles Manning  PTA [facility]  Acute  Rehab Pager     [phone]

## 2012-03-28 NOTE — Progress Notes (Signed)
[facility] PROGRESS NOTE  Charles Manning ZOX:[id number] DOB: [birth year] DOA: 03/22/2012 PCP: Jackie Plum, MD  Assessment/Plan: 1. Left upper arm abscess & cellulitis--improving, decreasing WBC. Continue vancomycin, daily CBC, wound care. S/p I&D 10/17 by orthopedics. Will need [facility] RN for wound care. 2. DM type 2 uncontrolled--somewhat improved with increased Lantus. Last two CBG <200. Continue Lantus, Amaryl, SSI, meal coverage. Hgb A1c 13.4.  3. Hyponatremia--stable, mild, asymptomatic. Likely secondary to Celexa. 4. Depression/anxiety--stable. Continue Seroquel, Celexa, Buspar. 5. Chronic pain syndrome--stable, continue gabapentin. 6. IV drug use--patient admits. Declines CSW assistance. Recommend cessation.  Code Status: full code Family Communication: none present Disposition Plan: home when improved, possibly 48 hours  Brendia Sacks, MD  [facility] Team 6 Pager [phone]. If 8PM-8AM, please contact night-coverage at [email or web address], password [email or web address] 03/28/2012, 3:00 PM  LOS: 6 days   Brief narrative: 56 year old man presented with LUE cellulitis and abscess.  Consultants:  Orthopedics  Procedures:  10/16 I&D in ED  10/17 I&D at bedside by Dr. Lajoyce Corners.  Antibiotics:  Clindamycin 10/16 >>10/19  Vancomycin 10/19 >>  HPI/Subjective: Continues to feel better. Minimal arm pain, erythema decreasing. Has had a lot of spontaneous drainage from open area below incision. Area of induration softer and less tender.  Objective: Filed Vitals:   03/27/12 2145 03/28/12 0530 03/28/12 1030 03/28/12 1400  BP: 114/73 144/94 129/83 127/74  Pulse: 65 67 60 67  Temp: 98.7 F (37.1 C) 98 F (36.7 C) 98.4 F (36.9 C) 98 F (36.7 C)  TempSrc: Oral Oral Oral Oral  Resp: 18 18 18 18   Height:      Weight:      SpO2: 96% 95% 95% 100%    Intake/Output Summary (Last 24 hours) at 03/28/12 1500 Last data filed at 03/28/12 1400  Gross per 24 hour  Intake   1440 ml    Output   5170 ml  Net  -3730 ml   Filed Weights   03/23/12 0500  Weight: 74.844 kg (165 lb)    Exam:  General:  Appears calm and comfortable  Cardiovascular: RRR, no m/r/g. Respiratory: CTA bilaterally, no w/r/r. Normal respiratory effort. Skin: left upper arm--erythema continues to decrease. Small open area below incision spontaneously draining exudate. Induration inner bicep inferior to incision is softer still, skin is wrinking and less indurated. Psychiatric: speech fluent and appropriate  Data Reviewed: Basic Metabolic Panel:  Lab 03/28/12 8295 03/27/12 0504 03/26/12 0417 03/25/12 0435 03/24/12 0400  NA 133* 132* 131* 132* 134*  K 3.9 4.0 4.0 4.1 4.7  CL 99 98 94* 99 103  CO2 26 25 25 23 24   GLUCOSE 223* 185* 324* 264* 298*  BUN 17 14 16 14 14   CREATININE 0.84 0.84 0.80 0.72 0.72  CALCIUM 8.9 9.1 9.2 8.9 8.9  MG -- -- -- -- --  PHOS -- -- -- -- --   CBC:  Lab 03/28/12 0355 03/27/12 0504 03/26/12 0417 03/25/12 0435 03/24/12 0400 03/22/12 2145  WBC 14.7* 20.9* 24.8* 24.6* 18.1* --  NEUTROABS -- -- -- -- -- 12.6*  HGB 13.5 13.9 14.6 14.6 13.8 --  HCT 39.3 40.4 42.4 42.2 40.0 --  MCV 88.7 88.8 89.8 89.4 88.9 --  PLT 431* 418* 449* 371 407* --   CBG:  Lab 03/28/12 1141 03/28/12 0700 03/27/12 2146 03/27/12 1635 03/27/12 1152  GLUCAP 113* 195* 298* 218* 223*   Studies: No results found.  Scheduled Meds:    . busPIRone  10 mg Oral TID  . citalopram  20  mg Oral Daily  . fenofibrate  160 mg Oral Daily  . gabapentin  600 mg Oral BID  . glimepiride  4 mg Oral QAC breakfast  . insulin aspart  0-20 Units Subcutaneous TID WC  . insulin aspart  0-5 Units Subcutaneous QHS  . insulin aspart  4 Units Subcutaneous TID WC  . insulin glargine  50 Units Subcutaneous QHS  . lisinopril  10 mg Oral Daily  . QUEtiapine  150 mg Oral Daily  . vancomycin  1,000 mg Intravenous Q8H   Continuous Infusions:    Principal Problem:  *Abscess of left arm Active Problems:  HTN  (hypertension)  Diabetes mellitus type 2 with complications, uncontrolled  Depression  Chronic pain syndrome  Hyponatremia  Dehydration  IV drug user     Brendia Sacks, MD  [facility] Team 6 Pager [phone]. If 8PM-8AM, please contact night-coverage at [email or web address], password [facility] 03/28/2012, 3:00 PM  LOS: 6 days   Time spent: 15 minutes

## 2012-03-29 LAB — GLUCOSE, CAPILLARY
Glucose-Capillary: 264 mg/dL — ABNORMAL HIGH (ref 70–99)
Glucose-Capillary: 216 mg/dL — ABNORMAL HIGH (ref 70–99)

## 2012-03-29 LAB — CBC
HCT: 40 % (ref 39.0–52.0)
MCHC: 33.8 g/dL (ref 30.0–36.0)
Platelets: 492 10*3/uL — ABNORMAL HIGH (ref 150–400)
RDW: 12 % (ref 11.5–15.5)
WBC: 12.4 10*3/uL — ABNORMAL HIGH (ref 4.0–10.5)

## 2012-03-29 LAB — VANCOMYCIN, TROUGH: Vancomycin Tr: 16.2 ug/mL (ref 10.0–20.0)

## 2012-03-29 LAB — BASIC METABOLIC PANEL
BUN: 21 mg/dL (ref 6–23)
Calcium: 9 mg/dL (ref 8.4–10.5)
Chloride: 104 mEq/L (ref 96–112)
Creatinine, Ser: 1.33 mg/dL (ref 0.50–1.35)
GFR calc Af Amer: 69 mL/min — ABNORMAL LOW (ref >90)

## 2012-03-29 MED ORDER — VANCOMYCIN HCL 1000 MG IV SOLR
750.0000 mg | Freq: Two times a day (BID) | INTRAVENOUS | Status: DC
  Filled 2012-03-29: qty 750

## 2012-03-29 MED ORDER — SODIUM CHLORIDE 0.9 % IV SOLN
INTRAVENOUS | Status: DC
  Administered 2012-03-29: 18:00:00 via INTRAVENOUS
  Administered 2012-03-30: 1000 mL via INTRAVENOUS

## 2012-03-29 MED ORDER — SULFAMETHOXAZOLE-TMP DS 800-160 MG PO TABS
2.0000 | ORAL_TABLET | Freq: Two times a day (BID) | ORAL | Status: DC
  Administered 2012-03-29 – 2012-03-31 (×4): 2 via ORAL
  Filled 2012-03-29 (×5): qty 2

## 2012-03-29 NOTE — Progress Notes (Signed)
PHARMACY BRIEF NOTE - VANCOMYCIN  SCr 1.33 (up from 0.84 yesterday)  D4 Vancomycin 1 gram IV q8h - trough was 10 yesterday.  Have ordered another Vancomycin trough for 11am today to allow readjustment of dosage.   Holding further Vancomycin doses until results reviewed.   Full note to follow at that time.  Elie Goody, PharmD, BCPS Pager: [phone] 03/29/2012  10:18 AM

## 2012-03-29 NOTE — Progress Notes (Signed)
Reported given to [facility] [place] Nurse, patient is stable, vital signs are stable, checked with physical therapy for hydrotherapy and they stated they would see the patient around 14:00pm, last cbg was 264 coverage given and patient ate 100% of his meal. Means, [employer] RN 03-29-2012

## 2012-03-29 NOTE — Progress Notes (Signed)
Inpatient Diabetes Program Recommendations  AACE/ADA: New Consensus Statement on Inpatient Glycemic Control (2013)  Target Ranges:  Prepandial:   less than 140 mg/dL      Peak postprandial:   less than 180 mg/dL (1-2 hours)      Critically ill patients:  140 - 180 mg/dL   Reason for Visit: Sub optimal glycemic control  Inpatient Diabetes Program Recommendations Insulin - Basal: Currently receiving Lantus 50 units at HS   Correction (SSI): Resistant correction scale tid with meals and HS correction scale  Insulin - Meal Coverage: Currently receiving 4 units tid Oral Agents: Currently receiving Amaryl 4 mg daily HgbA1C: 13.4 Diet: CHO Modified Medium  Note:  Results for ROCKNE, SALDUTTI (MRN [id number]) as of 03/29/2012 14:19  Ref. Range 03/28/2012 07:00 03/28/2012 11:41 03/28/2012 16:45 03/28/2012 21:27 03/29/2012 07:46 03/29/2012 10:43 03/29/2012 12:03  Glucose-Capillary Latest Range: 70-99 mg/dL 409 (H) 811 (H) 914 (H) 292 (H) 264 (H) 84 226 (H)   Note CBG of 84 mg/dl at 78:29 today.  Per nurse who received patient in transfer from [facility], just before transport patient complained of feeling "low".  Was given peanut butter and crackers-- then CBG was 226 when checked before lunch about one hour later.  Diabetes Coordinator will continue to follow.  Thank you. Biana Haggar S. Elsie Lincoln, RN, CNS, CDE pager [phone].

## 2012-03-29 NOTE — Progress Notes (Signed)
Physical Therapy Wound Treatment Patient Details  Name: Charles Manning MRN: [id number] Date of Birth: November 17, 1955  Today's Date: 03/29/2012 Time:  1410- 1439    Subjective  Subjective: I've been trying to get the "tofu" out. (in regards to pus)  Pain Score: Pain Score:   8  Wound Assessment  Wound 03/25/12 Other (Comment) Arm Left;Upper s/p Iand D abcess left biceps head (Active)  Site / Wound Assessment Granulation tissue 03/29/2012  2:49 PM  % Wound base Red or Granulating 90% 03/29/2012  2:49 PM  % Wound base Yellow 10% 03/29/2012  2:49 PM  Peri-wound Assessment Erythema (non-blanchable);Edema;Excoriated 03/29/2012  2:49 PM  Wound Length (cm) 3.2 cm 03/25/2012 11:00 AM  Wound Width (cm) 1.2 cm 03/25/2012 11:00 AM  Wound Depth (cm) 1.4 cm 03/25/2012 11:00 AM  Closure None 03/29/2012  2:49 PM  Drainage Amount Minimal 03/29/2012  2:49 PM  Drainage Description No odor;Purulent;Serosanguineous 03/29/2012  2:49 PM  Non-staged Wound Description Not applicable 03/29/2012  2:49 PM  Treatment Hydrotherapy (Pulse lavage);Cleansed;Packing (Dry gauze);Packing (Saline gauze) 03/29/2012  2:49 PM  Dressing Type Moist to dry;Gauze (Comment);Paper tape 03/29/2012  2:49 PM  Dressing Changed New 03/29/2012  2:49 PM  Dressing Status New drainage 03/29/2012  2:49 PM     Incision 01/19/12 Arm Left;Upper (Active)  Site / Wound Assessment Pink;Red;Yellow 03/29/2012  2:49 PM  Margins Unattacted edges (unapproximated) 03/29/2012  2:49 PM  Drainage Amount None 03/29/2012  2:49 PM  Drainage Description Serosanguineous 03/24/2012  8:00 PM  Dressing Type Gauze (Comment) 03/29/2012  2:49 PM  Dressing Clean;Dry;Intact 03/29/2012  2:49 PM   Hydrotherapy Pulsed lavage therapy - wound location: Left upper arm-biceps Pulsed Lavage with Suction (psi): 8 psi (varied from 4-12 as tolerated) Pulsed Lavage with Suction - Normal Saline Used: 1000 mL Pulsed Lavage Tip: Tip with splash shield   Wound Assessment and  Plan  Wound Therapy - Assess/Plan/Recommendations Wound Therapy - Clinical Statement: Main open wound on bicep is continuing to form granulation tissue.  However, noted that there is some tunneling at lower wound that is medial and inferior to main wound.  Was able to use cotton swab tip to get "wicked" dry guaze into wound, however pt with increased pain and unable to attempt to open more.   Factors Delaying/Impairing Wound Healing: Diabetes Mellitus;Substance abuse Hydrotherapy Plan: Debridement;Dressing change;Patient/family education;Pulsatile lavage with suction Wound Therapy - Frequency: 6X / week Wound Therapy - Follow Up Recommendations: Home health RN Wound Plan: pulsatile lavage for wound debridement 6x/wk  Wound Therapy Goals- Improve the function of patient's integumentary system by progressing the wound(s) through the phases of wound healing (inflammation - proliferation - remodeling) by: Decrease Length/Width/Depth - Progress: Progressing toward goal Improve Drainage Characteristics - Progress: Progressing toward goal  Goals will be updated until maximal potential achieved or discharge criteria met.  Discharge criteria: when goals achieved, discharge from hospital, MD decision/surgical intervention, no progress towards goals, refusal/missing three consecutive treatments without notification or medical reason.  GP     Page, Meribeth Mattes 03/29/2012, 3:35 PM

## 2012-03-29 NOTE — Progress Notes (Signed)
Patient arrived on floor without SCDs.  Ordered and placed with explanation as the reason we use these while in hospital.

## 2012-03-29 NOTE — Progress Notes (Signed)
[facility] PROGRESS NOTE  Charles Manning ZOX:[id number] DOB: 1956-04-23 DOA: 03/22/2012 PCP: Jackie Plum, MD  Assessment/Plan: 1. Left upper arm abscess & cellulitis--continues to improve, WBC near normal. Stop vancomycin, start Bactrim. Area of previous I&D much better, area inferior to may need I&D--discussed with Dr. Lajoyce Corners, he will re-evaluated at bedside 10/23 AM. Continue wound care. Refuses HH RN for wound care. 2. Elevated creatinine--possibly related to vancomycin but I rather suspect one-time dose of Toradol given overnight. IVF, stop vancomycin given clinical improvement, no further Toradol, check BMP in AM. 3. DM type 2 uncontrolled--labile. No changes today. Continue Lantus, Amaryl, SSI, meal coverage. Hgb A1c 13.4. Wants pen on discharge. 4. Hyponatremia--resolved, likely secondary to acute illness, possible drug component--Celexa. 5. Depression/anxiety--stable. Continue Seroquel, Celexa, Buspar. 6. Chronic pain syndrome--stable, continue gabapentin. 7. IV drug use--patient admits. Declines CSW assistance. Recommend cessation.  Code Status: full code Family Communication: none present Disposition Plan: home when improved, possibly 10/23.  Brendia Sacks, MD  [facility] Team 6 Pager [phone]. If 8PM-8AM, please contact night-coverage at [email or web address], password [facility] 03/29/2012, 4:52 PM  LOS: 7 days   Brief narrative: 56 year old man presented with LUE cellulitis and abscess.  Consultants:  Orthopedics  Procedures:  10/16 I&D in ED  10/17 I&D at bedside by Dr. Lajoyce Corners.  Antibiotics:  Clindamycin 10/16 >>10/19  Vancomycin 10/19 >> 10/22  Bactrim 10/22 >>  HPI/Subjective: Feels better, little pain, still expressing thick pus from wound.  Objective: Filed Vitals:   03/29/12 0653 03/29/12 1100 03/29/12 1244 03/29/12 1506  BP: 134/71 109/68 128/68 126/78  Pulse: 53 68 61 58  Temp: 98.1 F (36.7 C) 98.2 F (36.8 C) 98.5 F (36.9 C) 98.1 F  (36.7 C)  TempSrc: Oral Oral Oral Oral  Resp: 16 16 20 18   Height:      Weight:      SpO2: 97% 95% 96% 97%    Intake/Output Summary (Last 24 hours) at 03/29/12 1652 Last data filed at 03/29/12 1247  Gross per 24 hour  Intake    400 ml  Output   2900 ml  Net  -2500 ml   Filed Weights   03/23/12 0500  Weight: 74.844 kg (165 lb)    Exam:  General:  Appears calm and comfortable  Cardiovascular: RRR, no m/r/g. Respiratory: CTA bilaterally, no w/r/r. Normal respiratory effort. Skin: left upper arm--erythema continues to decrease. Small open area below incision still spontaneously draining exudate. Induration inner bicep inferior to incision is softer still. Psychiatric: speech fluent and appropriate  Data Reviewed: Basic Metabolic Panel:  Lab 03/29/12 8295 03/28/12 0355 03/27/12 0504 03/26/12 0417 03/25/12 0435  NA 138 133* 132* 131* 132*  K 4.3 3.9 4.0 4.0 4.1  CL 104 99 98 94* 99  CO2 23 26 25 25 23   GLUCOSE 206* 223* 185* 324* 264*  BUN 21 17 14 16 14   CREATININE 1.33 0.84 0.84 0.80 0.72  CALCIUM 9.0 8.9 9.1 9.2 8.9  MG -- -- -- -- --  PHOS -- -- -- -- --   CBC:  Lab 03/29/12 0430 03/28/12 0355 03/27/12 0504 03/26/12 0417 03/25/12 0435 03/22/12 2145  WBC 12.4* 14.7* 20.9* 24.8* 24.6* --  NEUTROABS -- -- -- -- -- 12.6*  HGB 13.5 13.5 13.9 14.6 14.6 --  HCT 40.0 39.3 40.4 42.4 42.2 --  MCV 89.5 88.7 88.8 89.8 89.4 --  PLT 492* 431* 418* 449* 371 --   CBG:  Lab 03/29/12 1203 03/29/12 1043 03/29/12 0746 03/28/12 2127 03/28/12 1645  GLUCAP 226* 84 264* 292* 333*   Studies: No results found.  Scheduled Meds:    . busPIRone  10 mg Oral TID  . citalopram  20 mg Oral Daily  . fenofibrate  160 mg Oral Daily  . gabapentin  600 mg Oral BID  . glimepiride  4 mg Oral QAC breakfast  . insulin aspart  0-20 Units Subcutaneous TID WC  . insulin aspart  0-5 Units Subcutaneous QHS  . insulin aspart  4 Units Subcutaneous TID WC  . insulin glargine  50 Units  Subcutaneous QHS  . ketorolac  30 mg Intravenous Once  . lisinopril  10 mg Oral Daily  . QUEtiapine  150 mg Oral Daily  . vancomycin  750 mg Intravenous Q12H  . DISCONTD: vancomycin  1,000 mg Intravenous Q8H   Continuous Infusions:    Principal Problem:  *Abscess of left arm Active Problems:  HTN (hypertension)  Diabetes mellitus type 2 with complications, uncontrolled  Depression  Chronic pain syndrome  Hyponatremia  Dehydration  IV drug user     Brendia Sacks, MD  [facility] Team 6 Pager [phone]. If 8PM-8AM, please contact night-coverage at [email or web address], password [facility] 03/29/2012, 4:52 PM  LOS: 7 days   Time spent: 20 minutes

## 2012-03-29 NOTE — Progress Notes (Signed)
ANTIBIOTIC CONSULT NOTE   Pharmacy Consult for Vancomycin Indication: Left upper arm abscess and cellulitis  Allergies  Allergen Reactions  . Vancomycin Itching    Patient Measurements: Height: 5\' 10"  (177.8 cm) Weight: 165 lb (74.844 kg) IBW/kg (Calculated) : 73    Vital Signs: Temp: 98.1 F (36.7 C) (10/22 0653) Temp src: Oral (10/22 0653) BP: 134/71 mmHg (10/22 0653) Pulse Rate: 53  (10/22 0653) Intake/Output from previous day: 10/21 0701 - 10/22 0700 In: 1080 [P.O.:480; IV Piggyback:600] Out: 4670 [Urine:4670] Intake/Output from this shift:    Labs:  Basename 03/29/12 0430 03/28/12 0355 03/27/12 0504  WBC 12.4* 14.7* 20.9*  HGB 13.5 13.5 13.9  PLT 492* 431* 418*  LABCREA -- -- --  CREATININE 1.33 0.84 0.84   Estimated Creatinine Clearance: 65.6 ml/min (by C-G formula based on Cr of 1.33).  Basename 03/29/12 1101 03/28/12 1142  VANCOTROUGH 16.2 10.0  VANCOPEAK -- --  VANCORANDOM -- --  GENTTROUGH -- --  GENTPEAK -- --  GENTRANDOM -- --  TOBRATROUGH -- --  TOBRAPEAK -- --  TOBRARND -- --  AMIKACINPEAK -- --  AMIKACINTROU -- --  AMIKACIN -- --     Microbiology: Recent Results (from the past 720 hour(s))  WOUND CULTURE     Status: Normal   Collection Time   03/23/12  4:08 AM      Component Value Range Status Comment   Specimen Description ARM   Final    Special Requests NONE   Final    Gram Stain     Final    Value: RARE WBC PRESENT, PREDOMINANTLY PMN     NO SQUAMOUS EPITHELIAL CELLS SEEN     NO ORGANISMS SEEN   Culture NO GROWTH 2 DAYS   Final    Report Status 03/25/2012 FINAL   Final     Medical History: Past Medical History  Diagnosis Date  . HTN (hypertension) 09/25/2011  . DM (diabetes mellitus) 09/25/2011  . Degenerative joint disease 09/25/2011  . Depression 09/25/2011  . Anxiety 09/25/2011  . Chronic pain syndrome 09/25/2011  . Basal cell cancer 09/25/2011    Mid back 2007  . Melanoma 09/25/2011    Right thigh 2005    Medications:    Prescriptions prior to admission  Medication Sig Dispense Refill  . busPIRone (BUSPAR) 10 MG tablet Take 1 tablet (10 mg total) by mouth 3 (three) times daily.  90 tablet  0  . citalopram (CELEXA) 20 MG tablet Take 1 tablet (20 mg total) by mouth daily.  30 tablet  0  . fenofibrate 160 MG tablet Take 1 tablet (160 mg total) by mouth daily.  30 tablet  0  . gabapentin (NEURONTIN) 600 MG tablet Take 600 mg by mouth 2 (two) times daily.      Marland Kitchen glimepiride (AMARYL) 4 MG tablet Take 4 mg by mouth daily before breakfast.      . HYDROcodone-acetaminophen (NORCO/VICODIN) 5-325 MG per tablet Take 1 tablet by mouth every 6 (six) hours as needed. For pain      . insulin aspart protamine-insulin aspart (NOVOLOG 70/30) (70-30) 100 UNIT/ML injection Inject 50 Units into the skin 2 (two) times daily with a meal.      . insulin glargine (LANTUS) 100 UNIT/ML injection Inject 40 Units into the skin at bedtime.      Marland Kitchen lisinopril (PRINIVIL,ZESTRIL) 10 MG tablet Take 10 mg by mouth daily.      . meloxicam (MOBIC) 15 MG tablet Take 15 mg by mouth  daily.      . naproxen (NAPROSYN) 250 MG tablet Take 250 mg by mouth 2 (two) times daily with a meal.      . QUEtiapine (SEROQUEL XR) 150 MG 24 hr tablet Take 1 tablet (150 mg total) by mouth daily.  30 each  0  . traMADol (ULTRAM) 50 MG tablet Take 50 mg by mouth every 8 (eight) hours as needed. For pain      . traZODone (DESYREL) 150 MG tablet Take 150 mg by mouth at bedtime.       Scheduled:     . busPIRone  10 mg Oral TID  . citalopram  20 mg Oral Daily  . fenofibrate  160 mg Oral Daily  . gabapentin  600 mg Oral BID  . glimepiride  4 mg Oral QAC breakfast  . insulin aspart  0-20 Units Subcutaneous TID WC  . insulin aspart  0-5 Units Subcutaneous QHS  . insulin aspart  4 Units Subcutaneous TID WC  . insulin glargine  50 Units Subcutaneous QHS  . ketorolac  30 mg Intravenous Once  . lisinopril  10 mg Oral Daily  . QUEtiapine  150 mg Oral Daily  . DISCONTD:  vancomycin  1,000 mg Intravenous Q8H   Infusions:   Anti-infectives     Start     Dose/Rate Route Frequency Ordered Stop   03/26/12 2000   vancomycin (VANCOCIN) IVPB 1000 mg/200 mL premix  Status:  Discontinued        1,000 mg 200 mL/hr over 60 Minutes Intravenous Every 8 hours 03/26/12 1056 03/29/12 1012   03/26/12 1100   vancomycin (VANCOCIN) IVPB 1000 mg/200 mL premix        1,000 mg 200 mL/hr over 60 Minutes Intravenous NOW 03/26/12 1056 03/26/12 1255   03/23/12 0600   clindamycin (CLEOCIN) IVPB 600 mg  Status:  Discontinued        600 mg 100 mL/hr over 30 Minutes Intravenous 3 times per day 03/23/12 0357 03/26/12 1047   03/23/12 0115   clindamycin (CLEOCIN) IVPB 600 mg        600 mg 100 mL/hr over 30 Minutes Intravenous  Once 03/23/12 0111 03/23/12 0158         Assessment:  56 y/o M on D#4 Vancomycin 1 gram IV q8h for cellulitis and abscess L arm at site of illegal drug injection, s/p I&D on 10/17. Wound culture from arm abscess: no growth.  Vancomycin trough 10 (low therapeutic range) on 10/21.  SCr rising.  Repeat Vancomycin trough today is also rising.  Goal of Therapy:   Eradication of infection  Vancomycin trough 10-15  Plan:  1. Reduce Vancomycin to 750 mg IV q12h, next dose 10pm 2. Serum creatinine daily 3. Recheck Vancomycin trough tomorrow at 9am.  This will not be a steady-state value, but am concerned regarding potential for further drug accumulation despite Vancomycin dosage reduction in setting of rising serum creatinine.   Hope Budds, PharmD, BCPS Pager: [phone] 03/29/2012,12:03 PM

## 2012-03-30 DIAGNOSIS — F329 Major depressive disorder, single episode, unspecified: Secondary | ICD-10-CM

## 2012-03-30 LAB — CBC
Hemoglobin: 13.9 g/dL (ref 13.0–17.0)
MCH: 30.5 pg (ref 26.0–34.0)
MCHC: 34.2 g/dL (ref 30.0–36.0)
MCV: 89.3 fL (ref 78.0–100.0)
RBC: 4.56 MIL/uL (ref 4.22–5.81)

## 2012-03-30 LAB — BASIC METABOLIC PANEL
BUN: 16 mg/dL (ref 6–23)
CO2: 21 mEq/L (ref 19–32)
Calcium: 8.9 mg/dL (ref 8.4–10.5)
Glucose, Bld: 205 mg/dL — ABNORMAL HIGH (ref 70–99)
Potassium: 4.2 mEq/L (ref 3.5–5.1)
Sodium: 134 mEq/L — ABNORMAL LOW (ref 135–145)

## 2012-03-30 LAB — VANCOMYCIN, TROUGH: Vancomycin Tr: <5 ug/mL — ABNORMAL LOW (ref 10.0–20.0)

## 2012-03-30 LAB — GLUCOSE, CAPILLARY: Glucose-Capillary: 204 mg/dL — ABNORMAL HIGH (ref 70–99)

## 2012-03-30 MED ORDER — INSULIN ASPART PROT & ASPART (70-30 MIX) 100 UNIT/ML ~~LOC~~ SUSP
40.0000 [IU] | Freq: Two times a day (BID) | SUBCUTANEOUS | Status: DC
  Administered 2012-03-31: 40 [IU] via SUBCUTANEOUS
  Filled 2012-03-30: qty 3

## 2012-03-30 MED ORDER — INSULIN PEN STARTER KIT
1.0000 | Freq: Once | Status: AC
  Administered 2012-03-30: 1
  Filled 2012-03-30: qty 1

## 2012-03-30 NOTE — Progress Notes (Signed)
[facility] PROGRESS NOTE  Charles Manning JYN:[id number] DOB: [birth year] DOA: 03/22/2012 PCP: Jackie Plum, MD  Assessment/Plan:  1. Left upper arm abscess & cellulitis--continues to improve, WBC near normal. Stopped vancomycin yesterday. Patient started on Bactrim. Area of previous I&D much better, area inferior to may need I&D--discussed with Dr. Lajoyce Corners, he will re-evaluated at bedside 10/23 AM. Continue wound care. Refuses HH RN for wound care. 2. Elevated creatinine--possibly related to vancomycin but I rather suspect one-time dose of Toradol given overnight. IVF, stop vancomycin given clinical improvement, no further Toradol, check BMP in AM. 3. DM type 2 uncontrolled--labile. CBGs have ranged from 158 to 207. Hemoglobin A1c is 13.4 . Will discontinue Amaryl and meal coverage insulin. Discontinue Lantus. We'll place on NovoLog 70/30 at 40 units twice daily. Continue sliding scale insulin. Wants pen on discharge. 4. Hyponatremia--resolved, likely secondary to acute illness, possible drug component--Celexa. 5. Depression/anxiety--stable. Continue Seroquel, Celexa, Buspar. 6. Chronic pain syndrome--stable, continue gabapentin. 7. IV drug use--patient admits. Declines CSW assistance. Recommend cessation.     Code Status: Full Family Communication: Updated patient no family at bedside Disposition Plan: Home when medically stable.   Consultants:  Orthopedics: Dr. Lajoyce Corners  Procedures: 10/16 I&D in ED  10/17 I&D at bedside by Dr. Lajoyce Corners.   Antibiotics: Clindamycin 10/16 >>10/19  Vancomycin 10/19 >> 10/22  Bactrim 10/22    HPI/Subjective: No complainst. Feeling better.  Objective: Filed Vitals:   03/29/12 1506 03/29/12 2139 03/30/12 0705 03/30/12 1400  BP: 126/78 138/88 110/65 128/80  Pulse: 58 65 100 70  Temp: 98.1 F (36.7 C) 98 F (36.7 C) 98.1 F (36.7 C) 97.7 F (36.5 C)  TempSrc: Oral Oral Oral Oral  Resp: 18 18 18 16   Height:      Weight:      SpO2: 97%  97% 97% 96%    Intake/Output Summary (Last 24 hours) at 03/30/12 1749 Last data filed at 03/30/12 1300  Gross per 24 hour  Intake 1878.75 ml  Output   3100 ml  Net -1221.25 ml   Filed Weights   03/23/12 0500  Weight: 74.844 kg (165 lb)    Exam:   General:  NAD  Cardiovascular: Regular rate rhythm no murmurs rubs or gallops. No lower extremity edema.  Respiratory: Clear to auscultation bilaterally. No wheezes no crackles or rhonchi.  Abdomen: Soft nontender nondistended positive bowel sounds  Extremities: Left upper extremity with decreased erythema, incision site for I&D is clean and dry and intact. No discharge noted.  Data Reviewed: Basic Metabolic Panel:  Lab 03/30/12 8657 03/29/12 0430 03/28/12 0355 03/27/12 0504 03/26/12 0417  NA 134* 138 133* 132* 131*  K 4.2 4.3 3.9 4.0 4.0  CL 102 104 99 98 94*  CO2 21 23 26 25 25   GLUCOSE 205* 206* 223* 185* 324*  BUN 16 21 17 14 16   CREATININE 0.86 1.33 0.84 0.84 0.80  CALCIUM 8.9 9.0 8.9 9.1 9.2  MG -- -- -- -- --  PHOS -- -- -- -- --   Liver Function Tests: No results found for this basename: AST:5,ALT:5,ALKPHOS:5,BILITOT:5,PROT:5,ALBUMIN:5 in the last 168 hours No results found for this basename: LIPASE:5,AMYLASE:5 in the last 168 hours No results found for this basename: AMMONIA:5 in the last 168 hours CBC:  Lab 03/30/12 0520 03/29/12 0430 03/28/12 0355 03/27/12 0504 03/26/12 0417  WBC 11.9* 12.4* 14.7* 20.9* 24.8*  NEUTROABS -- -- -- -- --  HGB 13.9 13.5 13.5 13.9 14.6  HCT 40.7 40.0 39.3 40.4 42.4  MCV 89.3  89.5 88.7 88.8 89.8  PLT 518* 492* 431* 418* 449*   Cardiac Enzymes: No results found for this basename: CKTOTAL:5,CKMB:5,CKMBINDEX:5,TROPONINI:5 in the last 168 hours BNP (last 3 results) No results found for this basename: PROBNP:3 in the last 8760 hours CBG:  Lab 03/30/12 1711 03/30/12 1209 03/30/12 0738 03/29/12 2156 03/29/12 1653  GLUCAP 204* 207* 158* 216* 160*    Recent Results (from the  past 240 hour(s))  WOUND CULTURE     Status: Normal   Collection Time   03/23/12  4:08 AM      Component Value Range Status Comment   Specimen Description ARM   Final    Special Requests NONE   Final    Gram Stain     Final    Value: RARE WBC PRESENT, PREDOMINANTLY PMN     NO SQUAMOUS EPITHELIAL CELLS SEEN     NO ORGANISMS SEEN   Culture NO GROWTH 2 DAYS   Final    Report Status 03/25/2012 FINAL   Final      Studies: No results found.  Scheduled Meds:   . busPIRone  10 mg Oral TID  . citalopram  20 mg Oral Daily  . fenofibrate  160 mg Oral Daily  . Flexpen Starter Kit  1 kit Other Once  . gabapentin  600 mg Oral BID  . insulin aspart  0-20 Units Subcutaneous TID WC  . insulin aspart  0-5 Units Subcutaneous QHS  . insulin aspart protamine-insulin aspart  40 Units Subcutaneous BID WC  . QUEtiapine  150 mg Oral Daily  . sulfamethoxazole-trimethoprim  2 tablet Oral Q12H  . DISCONTD: glimepiride  4 mg Oral QAC breakfast  . DISCONTD: insulin aspart  4 Units Subcutaneous TID WC  . DISCONTD: insulin glargine  50 Units Subcutaneous QHS   Continuous Infusions:   . sodium chloride 1,000 mL (03/30/12 1258)    Principal Problem:  *Abscess of left arm Active Problems:  HTN (hypertension)  Diabetes mellitus type 2 with complications, uncontrolled  Depression  Chronic pain syndrome  Hyponatremia  Dehydration  IV drug user    Time spent: > 30 mins    [facility]  [facility] Pager [phone]. If 8PM-8AM, please contact night-coverage at [email or web address], password [facility] 03/30/2012, 5:49 PM  LOS: 8 days

## 2012-03-30 NOTE — Consult Note (Signed)
  Patient is a 56 year old gentleman who is seen in followup for abscess left arm. He initially underwent irrigation and debridement of the abscess and he is seen today for followup for persistent infection distal to his previous irrigation and debridement.  Objective on examination patient does have a very small cysts fluctuant area distal to his previous irrigation and debridement. The previous irrigation and debridement  site shows good beefy granulation tissue. Distally there is some mild redness but no induration no cellulitis.  Assessment: New abscess left arm status post previous irrigation and debridement.  Plan: After informed consent and sterile prepping patient underwent a local anesthesia with 10 cc of 2% lidocaine plain after adequate levels of anesthesia were obtained a longitudinal incision was made approximately 3 cm in length. There was a very small abscess in this area there was abscess tissue that was also mechanically debrided. Patient had excisional debridement of the necrotic tissue. The wound was irrigated and with the open with a 4 x 4 gauze sterile dressing was applied. Plan for discharge to home on by mouth antibiotics and I will followup in the office in 2 weeks.

## 2012-03-30 NOTE — Progress Notes (Signed)
Patient ID: DIN Charles Manning, male   DOB: 12/03/1955, 56 y.o.   MRN: [id number] Plan for repeat debridement at bedside this afternoon of left arm

## 2012-03-30 NOTE — Progress Notes (Signed)
Spoke with patient about his home diabetes regimen.  Patient stated he takes Novolog 70/30 insulin 50 units bid with meals plus Amaryl 4 mg daily at home.  Patient stated he used to take Lantus, but that his PCP switched him to 70/30 insulin bid to simplify his regimen.  Patient stated he is really only willing to take 2 shots of insulin a day.  Noted patient currently on Lantus 50 units QHS, Novolog Resistant SSI, Novolog 4 units tid with meals (meal coverage), and home Amaryl.  May want to go ahead and switch patient to 70/30 insulin in preparation for discharge.  Patient states he wants insulin pens for home (patient can get Novolog 70/30 insulin in pen form at the pharmacy).  Stated he has used insulin pens before and would like to use pens again.  Will have RN make sure patient able to safely and accurately use insulin pen at home.  Recommend the following: 1. Stop Lantus 2. Stop Novolog 4 units tid with meals (meal coverage) 3. Continue Novolog SSI 4. Start Novolog 70/30 insulin- 40 units bid with meals (titrate as needed to home dose)  Patient will need Rx for Novolog 70/30 flexpens at d/c.  Will follow. Ambrose Finland RN, MSN, CDE Diabetes Coordinator Inpatient Diabetes Program [phone]

## 2012-03-30 NOTE — Discharge Instructions (Signed)
[place] left arm with dial soap and water daily applied dry dressing daily.

## 2012-03-31 DIAGNOSIS — I10 Essential (primary) hypertension: Secondary | ICD-10-CM

## 2012-03-31 LAB — CBC
MCH: 30.8 pg (ref 26.0–34.0)
MCHC: 34.8 g/dL (ref 30.0–36.0)
MCV: 88.7 fL (ref 78.0–100.0)
Platelets: 493 10*3/uL — ABNORMAL HIGH (ref 150–400)
RBC: 4.51 MIL/uL (ref 4.22–5.81)
RDW: 12.2 % (ref 11.5–15.5)

## 2012-03-31 LAB — GLUCOSE, CAPILLARY
Glucose-Capillary: 57 mg/dL — ABNORMAL LOW (ref 70–99)
Glucose-Capillary: 247 mg/dL — ABNORMAL HIGH (ref 70–99)

## 2012-03-31 LAB — BASIC METABOLIC PANEL
CO2: 20 mEq/L (ref 19–32)
Calcium: 8.9 mg/dL (ref 8.4–10.5)
Creatinine, Ser: 0.82 mg/dL (ref 0.50–1.35)
GFR calc non Af Amer: >90 mL/min (ref >90)
Glucose, Bld: 352 mg/dL — ABNORMAL HIGH (ref 70–99)
Sodium: 131 mEq/L — ABNORMAL LOW (ref 135–145)

## 2012-03-31 MED ORDER — MORPHINE SULFATE 2 MG/ML IJ SOLN
2.0000 mg | INTRAMUSCULAR | Status: DC | PRN
  Administered 2012-03-31: 2 mg via INTRAVENOUS
  Filled 2012-03-31 (×2): qty 1

## 2012-03-31 MED ORDER — SULFAMETHOXAZOLE-TMP DS 800-160 MG PO TABS: 1.0000 | ORAL_TABLET | Freq: Two times a day (BID) | ORAL | Status: DC

## 2012-03-31 MED ORDER — MORPHINE SULFATE 2 MG/ML IJ SOLN
INTRAMUSCULAR | Status: AC
  Administered 2012-03-31: 2 mg via INTRAVENOUS
  Filled 2012-03-31: qty 1

## 2012-03-31 MED ORDER — INSULIN ASPART PROT & ASPART (70-30 MIX) 100 UNIT/ML ~~LOC~~ SUSP: 35.0000 [IU] | Freq: Two times a day (BID) | SUBCUTANEOUS | Status: DC

## 2012-03-31 MED ORDER — HYDROCODONE-ACETAMINOPHEN 5-325 MG PO TABS: 1.0000 | ORAL_TABLET | Freq: Four times a day (QID) | ORAL | Status: DC | PRN

## 2012-03-31 MED ORDER — INSULIN PEN NEEDLE 32G X 8 MM MISC: 35.0000 [IU] | Freq: Two times a day (BID) | Status: DC

## 2012-03-31 NOTE — Progress Notes (Addendum)
03/31/12 1000  Subjective Assessment  Subjective my blood sugar is low, RN giving OJ  Patient and Family Stated Goals wound closure  Date of Onset 03/25/12  Prior Treatments surgical I and D 10/17 and 10/23  Evaluation and Treatment  Evaluation and Treatment Procedures Explained to Patient/Family Yes  Evaluation and Treatment Procedures agreed to  Wound 03/25/12 Other (Comment) Arm Left;Upper s/p Iand D abcess left biceps head  Date First Assessed/Time First Assessed: 03/25/12 1100   Wound Type: Other (Comment)  Location: Arm  Location Orientation: Left;Upper  Wound Description (Comments): s/p Iand D abcess left biceps head  Present on Admission: (c) Yes  Site / Wound Assessment Granulation tissue;Yellow  % Wound base Red or Granulating 95%  % Wound base Yellow 5%  Peri-wound Assessment Intact  Wound Length (cm) 2.1 cm (second  (medial) incision measurements)  Wound Width (cm) 0.7 cm  Wound Depth (cm) 0.7 cm  Closure None  Drainage Amount Minimal  Drainage Description Serous;No odor  Non-staged Wound Description Not applicable  Treatment Debridement (Selective);Hydrotherapy (Pulse lavage);Packing (Saline gauze)  Dressing Type Moist to dry (ACE)  Dressing Changed Changed  Dressing Status Clean;Dry;Intact  Hydrotherapy  Pulsed Lavage with Suction (psi) 8 psi  Pulsed Lavage with Suction - Normal Saline Used 1000 mL  Pulsed Lavage Tip Tip with splash shield  Pulsed lavage therapy - wound location 2 open I & D sites left upper arm (medial and anterior)  Selective Debridement  Selective Debridement - Location both wounds   Selective Debridement - Tools Used Forceps  Selective Debridement - Tissue Removed yellow slough  Wound Therapy - Assess/Plan/Recommendations  Wound Therapy - Clinical Statement wounds healing, cellullitis resolving; increased granulation tissue  Factors Delaying/Impairing Wound Healing Diabetes Mellitus;Substance abuse  Hydrotherapy Plan Debridement;Dressing  change;Patient/family education;Pulsatile lavage with suction  Wound Therapy - Frequency 6X / week  Wound Therapy - Current Recommendations [facility] for wound checks/dsg change)  Wound Therapy - Follow Up Recommendations Home health RN  Wound Plan pulsatile lavage for wound debridement 6x/wk  Wound Therapy Goals - Improve the function of patient's integumentary system by progressing the wound(s) through the phases of wound healing by:  Decrease Length/Width/Depth - Progress Progressing toward goal  Improve Drainage Characteristics - Progress Met  Patient/Family will be able to  verbalize/return demo dressing changes  Patient/Family Instruction Goal - Progress Progressing toward goal  Additional Wound Therapy Goal pt will return demo AROM LUE to assist with blood flow/healing and prevent contracture  Additional Wound Therapy Goal - Progress Met  Time For Goal Achievement 7 days  Wound Therapy - Potential for Goals Good

## 2012-03-31 NOTE — Progress Notes (Signed)
Results for ALDEAN, PIPE (MRN [id number]) as of 03/31/2012 13:41  Ref. Range 03/30/2012 07:38 03/30/2012 12:09 03/30/2012 17:11 03/30/2012 21:14  Glucose-Capillary Latest Range: 70-99 mg/dL 409 (H) 811 (H) 914 (H) 150 (H)    Results for ERCIL, CASSIS (MRN [id number]) as of 03/31/2012 13:41  Ref. Range 03/31/2012 07:21 03/31/2012 10:25 03/31/2012 12:31  Glucose-Capillary Latest Range: 70-99 mg/dL 086 (H) 57 (L) 578 (H)   Noted patient converted to 70/30 insulin bid last night.  Did not receive 1st dose 70/30 insulin until this morning with breakfast (40 units 70/30 insulin).  Patient did not get Lantus insulin last night as plan was patient to restart on his home 70/30 insulin.  As a result, patient's morning CBG today 312 mg/dl.  Patient received 15 units Novolog SSI + 40 units 70/30 insulin.  Likely experienced hypoglycemia at 10am b/c he received so much rapid-acting insulin (15 units SSI plus he received 12 units Novolog that came through his dose of 70/30 insulin- 30% of the 40 units was Novolog).  Noted 70/30 insulin decreased to 35 units bid with meals as a result.  MD, may want to consider decreasing Novolog correction scale (SSI) to Moderate scale tid + HS (currently ordered as Resistant scale).  Will follow. Ambrose Finland RN, MSN, CDE Diabetes Coordinator Inpatient Diabetes Program [phone]

## 2012-03-31 NOTE — Progress Notes (Signed)
[facility] RN in to speak with patient regarding wound care after discharge.  Patient informed her he was not interested in having a home health nurse come to assist him, he would manage on his own, Dr. Janee Morn informed of his decision.  Karen,RN his nurse will attempt to give him instructions on how to care for wound

## 2012-03-31 NOTE — Discharge Summary (Signed)
Physician Discharge Summary  Charles Manning ZOX:[id number] DOB: [birth year] DOA: 03/22/2012  PCP: Jackie Plum, MD  Admit date: 03/22/2012 Discharge date: 03/31/2012  Time spent: 70 minutes  Recommendations for Outpatient Follow-up:  1. Patient is to followup with Dr. Lajoyce Corners of orthopedics in 2 weeks post discharge. On followup patient will need to BMET. 2. Patient is to followup with PCP one week post discharge. Patient's diabetes will need to be reassessed at that time. Patient will need a basic metabolic profile to followup on his electrolytes and renal function. Patient's abscesses and cellulitis will also need to be assessed per PCP.  Discharge Diagnoses:  Principal Problem:  *Abscess of left arm Active Problems:  HTN (hypertension)  Diabetes mellitus type 2 with complications, uncontrolled  Depression  Chronic pain syndrome  Hyponatremia  Dehydration  IV drug user   Discharge Condition: Stable and improved  Diet recommendation: Carb modified diet  Filed Weights   03/23/12 0500  Weight: 74.844 kg (165 lb)    History of present illness:  Charles Manning is a 56 y.o. male who presents with an abscess on his LUE, onset last week, associated with significant pain, I+D performed by PCP 6 days ago, started on ABX at that time. Swelling and drainage continued and worsened, did have 1 episode of night sweats several days ago but no reported fever nor chills. Occuring in the context of poorly controlled IDDM.      Hospital Course:  #1 left extremity abscesses and cellulitis The patient presented with abscess in his left upper extremity with a cellulitis had I&D that was performed by his PCP 6 days prior to admission as done about it at that time however patient continued to have swelling and drainage which had worsened. Patient was admitted into the hospital he was started empirically on clindamycin for 3 days and then subsequently on IV vancomycin and monitored. He was noted  on admission to have a leukocytosis with a white count of 18.8. Patient was monitored an orthopedic consultation was obtained and patient was seen in consultation by Dr. due to. Patient had an I&D done at the bedside on 03/24/2012 and monitored. Patient improved clinically and his white count trended down. Patient was noted to have an other abscess on 03/30/2012 of which point in time he had another I&D done at bedside. Patient improved clinically he was transitioned to oral Bactrim which she tolerated patient will be discharged home on 2 weeks of oral Bactrim. Patient will followup with orthopedics 2 weeks post discharge. Patient discharged in stable and improved condition.  #2 elevated creatinine During the hospitalization patient was noted to have an elevated creatinine which was felt may have been related to vancomycin or Toradol. Vancomycin was discontinued he was hydrated with IV fluids and his renal function improved and had resolved by the day of discharge.  #3 uncontrolled type 2 diabetes During the hospitalization patient was known to have labral CBGs [place] blood glucose in the 300s. Hemoglobin A1c which was obtained was 13.4. It was felt patient's type 2 diabetes was uncontrolled likely secondary to noncompliance. Patient was supposed to be on NovoLog 70 3050 units twice daily at home however during the hospitalization was placed on 40 units twice daily with some low CBGs. Patient's NovoLog has been adjusted to 35 units twice daily and is to followup with his PCP as outpatient.  #4 hyponatremia During the hospitalization patient was noted to be hyponatremic it was felt this was likely secondary to his acute illness.  Patient was hydrated with IV fluids he was monitored his abscesses and cellulitis were treated with antibiotics. Patient's hyponatremia improved and had resolved by day of discharge.  The rest of patient's chronic medical issues room in stable throughout the hospitalization and  patient discharged in stable and improved condition.  Procedures: 10/16 I&D in ED  10/17 I&D at bedside by Dr. Lajoyce Corners.  03/30/12 I and D Dr Lajoyce Corners Antibiotics:  Clindamycin 10/16 >>10/19  Vancomycin 10/19 >> 10/22  Bactrim 10/22    Consultations: Orthopedics: Dr. Lajoyce Corners     Discharge Exam: Filed Vitals:   03/30/12 1400 03/30/12 2115 03/31/12 0620 03/31/12 1422  BP: 128/80 139/83 150/93 130/91  Pulse: 70 60 66 66  Temp: 97.7 F (36.5 C) 97.9 F (36.6 C) 97.8 F (36.6 C) 98.3 F (36.8 C)  TempSrc: Oral Oral Oral Oral  Resp: 16 20 18 16   Height:      Weight:      SpO2: 96% 99% 97% 94%    General: NAD Cardiovascular: RRR Respiratory: CTAB Extremity: Left upper extremity bandaged.  Discharge Instructions  Discharge Orders    Future Orders Please Complete By Expires   Diet Carb Modified      Increase activity slowly      Discharge instructions      Comments:   Follow up with Dr Lajoyce Corners in 1-2 weeks   Change dressing (specify)      Comments:   Dressing change: 1 times per day using wet to dry dressing changes. Take off dressing and may shower, then moist gauze with dry kerlex daily.       Medication List     As of 03/31/2012  4:01 PM    STOP taking these medications         glimepiride 4 MG tablet   Commonly known as: AMARYL      insulin glargine 100 UNIT/ML injection   Commonly known as: LANTUS      TAKE these medications         busPIRone 10 MG tablet   Commonly known as: BUSPAR   Take 1 tablet (10 mg total) by mouth 3 (three) times daily.      citalopram 20 MG tablet   Commonly known as: CELEXA   Take 1 tablet (20 mg total) by mouth daily.      fenofibrate 160 MG tablet   Take 1 tablet (160 mg total) by mouth daily.      gabapentin 600 MG tablet   Commonly known as: NEURONTIN   Take 600 mg by mouth 2 (two) times daily.      HYDROcodone-acetaminophen 5-325 MG per tablet   Commonly known as: NORCO/VICODIN   Take 1 tablet by mouth every 6 (six)  hours as needed. For pain      insulin aspart protamine-insulin aspart (70-30) 100 UNIT/ML injection   Commonly known as: NOVOLOG 70/30   Inject 35 Units into the skin 2 (two) times daily with a meal.      Insulin Pen Needle 32G X 8 MM Misc   35 Units by Does not apply route 2 (two) times daily with a meal.      lisinopril 10 MG tablet   Commonly known as: PRINIVIL,ZESTRIL   Take 10 mg by mouth daily.      meloxicam 15 MG tablet   Commonly known as: MOBIC   Take 15 mg by mouth daily.      naproxen 250 MG tablet   Commonly known as: NAPROSYN  Take 250 mg by mouth 2 (two) times daily with a meal.      QUEtiapine Fumarate 150 MG 24 hr tablet   Commonly known as: SEROQUEL XR   Take 1 tablet (150 mg total) by mouth daily.      sulfamethoxazole-trimethoprim 800-160 MG per tablet   Commonly known as: BACTRIM DS   Take 1 tablet by mouth 2 (two) times daily. Take for 2 weeks then stop.      traMADol 50 MG tablet   Commonly known as: ULTRAM   Take 50 mg by mouth every 8 (eight) hours as needed. For pain      traZODone 150 MG tablet   Commonly known as: DESYREL   Take 150 mg by mouth at bedtime.           Follow-up Information    Follow up with DUDA,MARCUS V, MD. In 2 weeks.   Contact information:   [address] [place] [address] [phone]       Follow up with OSEI-BONSU,GEORGE, MD. In 1 week.   Contact information:   [address], [address] [place] [address] [phone]           The results of significant diagnostics from this hospitalization (including imaging, microbiology, ancillary and laboratory) are listed below for reference.    Significant Diagnostic Studies: No results found.  Microbiology: Recent Results (from the past 240 hour(s))  WOUND CULTURE     Status: Normal   Collection Time   03/23/12  4:08 AM      Component Value Range Status Comment   Specimen Description ARM   Final    Special Requests NONE   Final    Gram  Stain     Final    Value: RARE WBC PRESENT, PREDOMINANTLY PMN     NO SQUAMOUS EPITHELIAL CELLS SEEN     NO ORGANISMS SEEN   Culture NO GROWTH 2 DAYS   Final    Report Status 03/25/2012 FINAL   Final      Labs: Basic Metabolic Panel:  Lab 03/31/12 [id number] 03/30/12 0520 03/29/12 0430 03/28/12 0355 03/27/12 0504  NA 131* 134* 138 133* 132*  K 4.6 4.2 4.3 3.9 4.0  CL 100 102 104 99 98  CO2 20 21 23 26 25   GLUCOSE 352* 205* 206* 223* 185*  BUN 14 16 21 17 14   CREATININE 0.82 0.86 1.33 0.84 0.84  CALCIUM 8.9 8.9 9.0 8.9 9.1  MG -- -- -- -- --  PHOS -- -- -- -- --   Liver Function Tests: No results found for this basename: AST:5,ALT:5,ALKPHOS:5,BILITOT:5,PROT:5,ALBUMIN:5 in the last 168 hours No results found for this basename: LIPASE:5,AMYLASE:5 in the last 168 hours No results found for this basename: AMMONIA:5 in the last 168 hours CBC:  Lab 03/31/12 0530 03/30/12 0520 03/29/12 0430 03/28/12 0355 03/27/12 0504  WBC 12.3* 11.9* 12.4* 14.7* 20.9*  NEUTROABS -- -- -- -- --  HGB 13.9 13.9 13.5 13.5 13.9  HCT 40.0 40.7 40.0 39.3 40.4  MCV 88.7 89.3 89.5 88.7 88.8  PLT 493* 518* 492* 431* 418*   Cardiac Enzymes: No results found for this basename: CKTOTAL:5,CKMB:5,CKMBINDEX:5,TROPONINI:5 in the last 168 hours BNP: BNP (last 3 results) No results found for this basename: PROBNP:3 in the last 8760 hours CBG:  Lab 03/31/12 1231 03/31/12 1025 03/31/12 0721 03/30/12 2114 03/30/12 1711  GLUCAP 247* 57* 312* 150* 204*       Signed:  Camia Dipinto  [facility] 03/31/2012,  4:01 PM

## 2012-03-31 NOTE — Progress Notes (Signed)
Pt chose to have [facility] to provide [facility] services. When Norberta Keens liaison for [facility] went to see him he stated he did not want home health services since he does think he is home bound.

## 2012-03-31 NOTE — Progress Notes (Signed)
[facility] PROGRESS NOTE  Charles Manning WUJ:[id number] DOB: Mar 28, 1956 DOA: 03/22/2012 PCP: Jackie Plum, MD  Assessment/Plan:  1. Left upper arm abscess & cellulitis--continues to improve, WBC near normal. Stopped vancomycin yesterday. Patient started on Bactrim. Area of previous I&D much better, area inferior s/p I&D per Dr. Lajoyce Corners on 03/24/12 and s/p I and D small abscess distal on 03/30/12. Continue wet to dry dressing changes daily. Continue oral bactrim. Patient has agreed to [facility]. 2. Elevated creatinine--possibly related to vancomycin but I rather suspect one-time dose of Toradol given overnight. IVF, stop vancomycin given clinical improvement, no further Toradol, Renal function improved. 3. DM type 2 uncontrolled--labile. CBGs have ranged from 57 to 312. Hemoglobin A1c is 13.4 Decrease NovoLog 70/30 to 35 units twice daily. Continue sliding scale insulin. Wants pen on discharge. 4. Hyponatremia--resolved, likely secondary to acute illness, possible drug component--Celexa. 5. Depression/anxiety--stable. Continue Seroquel, Celexa, Buspar. 6. Chronic pain syndrome--stable, continue gabapentin. 7. IV drug use--patient admits. Declines CSW assistance. Recommend cessation.     Code Status: Full Family Communication: Updated patient no family at bedside Disposition Plan: Home when medically stable.   Consultants:  Orthopedics: Dr. Lajoyce Corners  Procedures: 10/16 I&D in ED  10/17 I&D at bedside by Dr. Lajoyce Corners. 03/30/12 I and D Dr Lajoyce Corners  Antibiotics: Clindamycin 10/16 >>10/19  Vancomycin 10/19 >> 10/22  Bactrim 10/22    HPI/Subjective: Patient states he feels " shaky" secondary to low CBG of 57.   Objective: Filed Vitals:   03/30/12 0705 03/30/12 1400 03/30/12 2115 03/31/12 0620  BP: 110/65 128/80 139/83 150/93  Pulse: 100 70 60 66  Temp: 98.1 F (36.7 C) 97.7 F (36.5 C) 97.9 F (36.6 C) 97.8 F (36.6 C)  TempSrc: Oral Oral Oral Oral  Resp: 18 16 20 18   Height:       Weight:      SpO2: 97% 96% 99% 97%    Intake/Output Summary (Last 24 hours) at 03/31/12 1055 Last data filed at 03/31/12 0801  Gross per 24 hour  Intake    840 ml  Output   3825 ml  Net  -2985 ml   Filed Weights   03/23/12 0500  Weight: 74.844 kg (165 lb)    Exam:   General:  NAD  Cardiovascular: Regular rate rhythm no murmurs rubs or gallops. No lower extremity edema.  Respiratory: Clear to auscultation bilaterally. No wheezes no crackles or rhonchi.  Abdomen: Soft nontender nondistended positive bowel sounds  Extremities: Left upper extremity with decreased erythema, incision site for I&D is clean and dry and intact. No discharge noted. Bandaged per PT hydrotherapy.  Data Reviewed: Basic Metabolic Panel:  Lab 03/31/12 [id number] 03/30/12 0520 03/29/12 0430 03/28/12 0355 03/27/12 0504  NA 131* 134* 138 133* 132*  K 4.6 4.2 4.3 3.9 4.0  CL 100 102 104 99 98  CO2 20 21 23 26 25   GLUCOSE 352* 205* 206* 223* 185*  BUN 14 16 21 17 14   CREATININE 0.82 0.86 1.33 0.84 0.84  CALCIUM 8.9 8.9 9.0 8.9 9.1  MG -- -- -- -- --  PHOS -- -- -- -- --   Liver Function Tests: No results found for this basename: AST:5,ALT:5,ALKPHOS:5,BILITOT:5,PROT:5,ALBUMIN:5 in the last 168 hours No results found for this basename: LIPASE:5,AMYLASE:5 in the last 168 hours No results found for this basename: AMMONIA:5 in the last 168 hours CBC:  Lab 03/31/12 0530 03/30/12 0520 03/29/12 0430 03/28/12 0355 03/27/12 0504  WBC 12.3* 11.9* 12.4* 14.7* 20.9*  NEUTROABS -- -- -- -- --  HGB 13.9 13.9 13.5 13.5 13.9  HCT 40.0 40.7 40.0 39.3 40.4  MCV 88.7 89.3 89.5 88.7 88.8  PLT 493* 518* 492* 431* 418*   Cardiac Enzymes: No results found for this basename: CKTOTAL:5,CKMB:5,CKMBINDEX:5,TROPONINI:5 in the last 168 hours BNP (last 3 results) No results found for this basename: PROBNP:3 in the last 8760 hours CBG:  Lab 03/31/12 1025 03/31/12 0721 03/30/12 2114 03/30/12 1711 03/30/12 1209  GLUCAP 57*  312* 150* 204* 207*    Recent Results (from the past 240 hour(s))  WOUND CULTURE     Status: Normal   Collection Time   03/23/12  4:08 AM      Component Value Range Status Comment   Specimen Description ARM   Final    Special Requests NONE   Final    Gram Stain     Final    Value: RARE WBC PRESENT, PREDOMINANTLY PMN     NO SQUAMOUS EPITHELIAL CELLS SEEN     NO ORGANISMS SEEN   Culture NO GROWTH 2 DAYS   Final    Report Status 03/25/2012 FINAL   Final      Studies: No results found.  Scheduled Meds:    . busPIRone  10 mg Oral TID  . citalopram  20 mg Oral Daily  . fenofibrate  160 mg Oral Daily  . Flexpen Starter Kit  1 kit Other Once  . gabapentin  600 mg Oral BID  . insulin aspart  0-20 Units Subcutaneous TID WC  . insulin aspart  0-5 Units Subcutaneous QHS  . insulin aspart protamine-insulin aspart  40 Units Subcutaneous BID WC  . morphine      . QUEtiapine  150 mg Oral Daily  . sulfamethoxazole-trimethoprim  2 tablet Oral Q12H  . DISCONTD: glimepiride  4 mg Oral QAC breakfast  . DISCONTD: insulin aspart  4 Units Subcutaneous TID WC  . DISCONTD: insulin glargine  50 Units Subcutaneous QHS   Continuous Infusions:    . DISCONTD: sodium chloride 1,000 mL (03/30/12 1258)    Principal Problem:  *Abscess of left arm Active Problems:  HTN (hypertension)  Diabetes mellitus type 2 with complications, uncontrolled  Depression  Chronic pain syndrome  Hyponatremia  Dehydration  IV drug user    Time spent: > 30 mins    [facility]  [facility] Pager [phone]. If 8PM-8AM, please contact night-coverage at [email or web address], password [facility] 03/31/2012, 10:55 AM  LOS: 9 days

## 2012-03-31 NOTE — Progress Notes (Signed)
Patient participated in accessing My Chart, re-verbalized need for follow up appointments with his doctors, changing dressing once a day wet to dry, continues to refused home health assistance, and states he will using bus transportation home.  Dressing clean dry and intact, patient alert and oriented, Iv site unremarkable after catheter removed.  Ambulated without issue.

## 2012-04-01 NOTE — Progress Notes (Signed)
Discharge summary sent to payer through [facility]  

## 2012-06-21 ENCOUNTER — Emergency Department (HOSPITAL_COMMUNITY)
Admission: EM | Admit: 2012-06-21 | Discharge: 2012-06-22 | Discharge status: Against Medical Advice | Payer: Medicare Other | Attending: Emergency Medicine | Admitting: Emergency Medicine

## 2012-06-21 ENCOUNTER — Encounter (HOSPITAL_COMMUNITY): Payer: Self-pay | Admitting: Emergency Medicine

## 2012-06-21 DIAGNOSIS — Z8739 Personal history of other diseases of the musculoskeletal system and connective tissue: Secondary | ICD-10-CM | POA: Insufficient documentation

## 2012-06-21 DIAGNOSIS — E1169 Type 2 diabetes mellitus with other specified complication: Secondary | ICD-10-CM | POA: Insufficient documentation

## 2012-06-21 DIAGNOSIS — F411 Generalized anxiety disorder: Secondary | ICD-10-CM | POA: Insufficient documentation

## 2012-06-21 DIAGNOSIS — IMO0002 Reserved for concepts with insufficient information to code with codable children: Secondary | ICD-10-CM | POA: Insufficient documentation

## 2012-06-21 DIAGNOSIS — F3289 Other specified depressive episodes: Secondary | ICD-10-CM | POA: Insufficient documentation

## 2012-06-21 DIAGNOSIS — F329 Major depressive disorder, single episode, unspecified: Secondary | ICD-10-CM | POA: Insufficient documentation

## 2012-06-21 DIAGNOSIS — Z794 Long term (current) use of insulin: Secondary | ICD-10-CM | POA: Insufficient documentation

## 2012-06-21 DIAGNOSIS — Z85828 Personal history of other malignant neoplasm of skin: Secondary | ICD-10-CM | POA: Insufficient documentation

## 2012-06-21 DIAGNOSIS — L0390 Cellulitis, unspecified: Secondary | ICD-10-CM

## 2012-06-21 DIAGNOSIS — F17200 Nicotine dependence, unspecified, uncomplicated: Secondary | ICD-10-CM | POA: Insufficient documentation

## 2012-06-21 DIAGNOSIS — R739 Hyperglycemia, unspecified: Secondary | ICD-10-CM

## 2012-06-21 DIAGNOSIS — I10 Essential (primary) hypertension: Secondary | ICD-10-CM | POA: Insufficient documentation

## 2012-06-21 DIAGNOSIS — Z79899 Other long term (current) drug therapy: Secondary | ICD-10-CM | POA: Insufficient documentation

## 2012-06-21 DIAGNOSIS — G894 Chronic pain syndrome: Secondary | ICD-10-CM | POA: Insufficient documentation

## 2012-06-21 MED ORDER — CLINDAMYCIN HCL 300 MG PO CAPS: 300.0000 mg | ORAL_CAPSULE | Freq: Four times a day (QID) | ORAL | Status: DC

## 2012-06-21 NOTE — ED Notes (Signed)
Pt ambulates to exam room with steady gait using cane.

## 2012-06-21 NOTE — ED Provider Notes (Signed)
Medical screening examination/treatment/procedure(s) were performed by non-physician practitioner and as supervising physician I was immediately available for consultation/collaboration.   Pavle Wiler L Bracen Schum, MD 06/21/12 2359 

## 2012-06-21 NOTE — ED Notes (Signed)
Pt alert, arrives from home, c/o abscess to right upper arm, 3 cm raised red area noted, non open non draining

## 2012-06-21 NOTE — ED Provider Notes (Signed)
History  Scribed for Charles Gourd, PA-C/ Charles Melter, MD, the patient was seen in room WTR6/WTR6. This chart was scribed by Charles Manning. The patient's care started at 10:50 PM   CSN: [id number]  Arrival date & time 06/21/12  2153   First MD Initiated Contact with Patient 06/21/12 2245      Chief Complaint  Patient presents with  . Wound Check     The history is provided by the patient. No language interpreter was used.   Charles Manning is a 57 y.o. male who presents to the Emergency Department complaining of an abscess to his upper right arm that the pt noticed three days ago and has gotten worse.  Pt reports the area has gotten larger and is painful.   He describes the pain as a 7/10.   He denies drainage, fever, or chills.  Pt reports that he injected the upper right arm with insulin about 4-5 days.  Nothing seems to make the sx better or worse.  Pt has a h/o abscess with two over the last year with hospitalization.  Pt has h/o diabetes and reports he has recently had high blood glucose levels with his last reading of 200.  Pt is a former pt of [facility].       Past Medical History  Diagnosis Date  . HTN (hypertension) 09/25/2011  . DM (diabetes mellitus) 09/25/2011  . Degenerative joint disease 09/25/2011  . Depression 09/25/2011  . Anxiety 09/25/2011  . Chronic pain syndrome 09/25/2011  . Basal cell cancer 09/25/2011    Mid back 2007  . Melanoma 09/25/2011    Right thigh 2005    Past Surgical History  Procedure Date  . Tumor removal from right thigh   . Lymph node removal     Family History  Problem Relation Age of Onset  . Aneurysm Mother   . Cancer Father     History  Substance Use Topics  . Smoking status: Current Every Day Smoker -- 0.5 packs/day    Types: Cigarettes  . Smokeless tobacco: Never Used  . Alcohol Use: Yes     Comment: seldom      Review of Systems  Constitutional: Negative for fever and chills.  Respiratory: Negative for shortness of  breath.   Gastrointestinal: Negative for nausea and vomiting.  Skin:       Abscess to the upper right arm  Neurological: Negative for weakness.  All other systems reviewed and are negative.    Allergies  Vancomycin  Home Medications   Current Outpatient Rx  Name  Route  Sig  Dispense  Refill  . BUSPIRONE HCL 10 MG PO TABS   Oral   Take 1 tablet (10 mg total) by mouth 3 (three) times daily.   90 tablet   0   . CITALOPRAM HYDROBROMIDE 20 MG PO TABS   Oral   Take 1 tablet (20 mg total) by mouth daily.   30 tablet   0   . FENOFIBRATE 160 MG PO TABS   Oral   Take 1 tablet (160 mg total) by mouth daily.   30 tablet   0   . GABAPENTIN 600 MG PO TABS   Oral   Take 600 mg by mouth 2 (two) times daily.         . INSULIN ASPART PROT & ASPART (70-30) 100 UNIT/ML [place] SUSP   Subcutaneous   Inject 60 Units into the skin 2 (two) times daily with a meal.         .  LISINOPRIL 10 MG PO TABS   Oral   Take 10 mg by mouth daily.         . MELOXICAM 15 MG PO TABS   Oral   Take 15 mg by mouth daily.         Marland Kitchen NAPROXEN 250 MG PO TABS   Oral   Take 250 mg by mouth 2 (two) times daily with a meal.         . QUETIAPINE FUMARATE ER 150 MG PO TB24   Oral   Take 1 tablet (150 mg total) by mouth daily.   30 each   0   . TRAZODONE HCL 150 MG PO TABS   Oral   Take 150 mg by mouth at bedtime.           BP 143/87  Pulse 89  Temp 98 F (36.7 C) (Oral)  Resp 16  SpO2 99%  Physical Exam  Nursing note and vitals reviewed. Constitutional: He is oriented to person, place, and time. He appears well-developed and well-nourished. No distress.  HENT:  Head: Normocephalic and atraumatic.  Eyes: Conjunctivae normal and EOM are normal.  Neck: Normal range of motion. Neck supple. No tracheal deviation present.  Cardiovascular: Normal rate and regular rhythm.  Exam reveals no gallop and no friction rub.   No murmur heard. Pulmonary/Chest: Effort normal and breath sounds  normal. No respiratory distress. He has no wheezes. He has no rales.  Abdominal: Soft. Bowel sounds are normal. There is no tenderness.  Musculoskeletal: Normal range of motion.  Neurological: He is alert and oriented to person, place, and time.  Skin: Skin is warm and dry.     Psychiatric: He has a normal mood and affect. His behavior is normal.    ED Course  Procedures  INCISION AND DRAINAGE Performed by: Charles Manning Consent: Verbal consent obtained. Risks and benefits: risks, benefits and alternatives were discussed Type: abscess  Body area: right arm  Anesthesia: local infiltration  Incision was made with a scalpel.  Local anesthetic: lidocaine 2% with epinephrine  Anesthetic total: 3 ml  Complexity: complex Blunt dissection to break up loculations  Drainage: purulent  Drainage amount: large  Packing material: none  Patient tolerance: Patient tolerated the procedure well with no immediate complications.    DIAGNOSTIC STUDIES: Oxygen Saturation is 99% on room air, normal by my interpretation.    COORDINATION OF CARE:     Labs Reviewed - No data to display No results found.   1. Abscess and cellulitis   2. Hyperglycemia       MDM  57 y/o male with abscess and cellulitis to right arm. Abscess drained. Rx clindamycin for surrounding cellulitis and due to hx of diabetes. CBG 510. Advised patient it is in his best interest to stay in ED to receive fluid and insulin, however patient refuses and will leave AMA. Discussed risks in detail of leaving AMA. Patient states his understanding and will f/u with PCP tomorrow. Charles Manning aware patient leaving AMA.   I personally performed the services described in this documentation, which was scribed in my presence. The recorded information has been reviewed and is accurate.      Charles Mace, PA-C 06/21/12 2353

## 2012-06-22 LAB — GLUCOSE, CAPILLARY: Glucose-Capillary: 510 mg/dL — ABNORMAL HIGH (ref 70–99)

## 2012-10-30 ENCOUNTER — Emergency Department (HOSPITAL_COMMUNITY)
Admission: EM | Admit: 2012-10-30 | Discharge: 2012-10-30 | Disposition: A | Discharge status: Home / Self Care | Payer: Medicare Other | Attending: Emergency Medicine | Admitting: Emergency Medicine

## 2012-10-30 ENCOUNTER — Encounter (HOSPITAL_COMMUNITY): Payer: Self-pay | Admitting: *Deleted

## 2012-10-30 DIAGNOSIS — I10 Essential (primary) hypertension: Secondary | ICD-10-CM | POA: Insufficient documentation

## 2012-10-30 DIAGNOSIS — Z794 Long term (current) use of insulin: Secondary | ICD-10-CM | POA: Insufficient documentation

## 2012-10-30 DIAGNOSIS — F329 Major depressive disorder, single episode, unspecified: Secondary | ICD-10-CM | POA: Insufficient documentation

## 2012-10-30 DIAGNOSIS — L0291 Cutaneous abscess, unspecified: Secondary | ICD-10-CM

## 2012-10-30 DIAGNOSIS — F3289 Other specified depressive episodes: Secondary | ICD-10-CM | POA: Insufficient documentation

## 2012-10-30 DIAGNOSIS — W268XXA Contact with other sharp object(s), not elsewhere classified, initial encounter: Secondary | ICD-10-CM | POA: Insufficient documentation

## 2012-10-30 DIAGNOSIS — Z79899 Other long term (current) drug therapy: Secondary | ICD-10-CM | POA: Insufficient documentation

## 2012-10-30 DIAGNOSIS — M1990 Unspecified osteoarthritis, unspecified site: Secondary | ICD-10-CM | POA: Insufficient documentation

## 2012-10-30 DIAGNOSIS — IMO0002 Reserved for concepts with insufficient information to code with codable children: Secondary | ICD-10-CM | POA: Insufficient documentation

## 2012-10-30 DIAGNOSIS — F17200 Nicotine dependence, unspecified, uncomplicated: Secondary | ICD-10-CM | POA: Insufficient documentation

## 2012-10-30 DIAGNOSIS — Y9389 Activity, other specified: Secondary | ICD-10-CM | POA: Insufficient documentation

## 2012-10-30 DIAGNOSIS — G894 Chronic pain syndrome: Secondary | ICD-10-CM | POA: Insufficient documentation

## 2012-10-30 DIAGNOSIS — Z85820 Personal history of malignant melanoma of skin: Secondary | ICD-10-CM | POA: Insufficient documentation

## 2012-10-30 DIAGNOSIS — E119 Type 2 diabetes mellitus without complications: Secondary | ICD-10-CM | POA: Insufficient documentation

## 2012-10-30 DIAGNOSIS — Y929 Unspecified place or not applicable: Secondary | ICD-10-CM | POA: Insufficient documentation

## 2012-10-30 DIAGNOSIS — F411 Generalized anxiety disorder: Secondary | ICD-10-CM | POA: Insufficient documentation

## 2012-10-30 DIAGNOSIS — Z23 Encounter for immunization: Secondary | ICD-10-CM | POA: Insufficient documentation

## 2012-10-30 MED ORDER — LORAZEPAM 2 MG/ML IJ SOLN
1.0000 mg | Freq: Once | INTRAMUSCULAR | Status: AC
  Administered 2012-10-30: 1 mg via INTRAMUSCULAR
  Filled 2012-10-30: qty 1

## 2012-10-30 MED ORDER — CLINDAMYCIN HCL 150 MG PO CAPS: 300.0000 mg | ORAL_CAPSULE | Freq: Three times a day (TID) | ORAL | Status: DC

## 2012-10-30 MED ORDER — MORPHINE SULFATE 4 MG/ML IJ SOLN
4.0000 mg | Freq: Once | INTRAMUSCULAR | Status: AC
  Administered 2012-10-30: 4 mg via INTRAMUSCULAR
  Filled 2012-10-30: qty 1

## 2012-10-30 MED ORDER — TETANUS-DIPHTH-ACELL PERTUSSIS 5-2.5-18.5 LF-MCG/0.5 IM SUSP
0.5000 mL | Freq: Once | INTRAMUSCULAR | Status: AC
  Administered 2012-10-30: 0.5 mL via INTRAMUSCULAR
  Filled 2012-10-30: qty 0.5

## 2012-10-30 MED ORDER — LIDOCAINE-EPINEPHRINE 2 %-1:100000 IJ SOLN
20.0000 mL | Freq: Once | INTRAMUSCULAR | Status: AC
  Administered 2012-10-30: 20 mL
  Filled 2012-10-30: qty 1

## 2012-10-30 MED ORDER — CLINDAMYCIN HCL 300 MG PO CAPS
300.0000 mg | ORAL_CAPSULE | Freq: Once | ORAL | Status: AC
  Administered 2012-10-30: 300 mg via ORAL
  Filled 2012-10-30: qty 1

## 2012-10-30 NOTE — ED Provider Notes (Signed)
Medical screening examination/treatment/procedure(s) were performed by non-physician practitioner and as supervising physician I was immediately available for consultation/collaboration.  Yoshiaki Kreuser T Jd Mccaster, MD 10/30/12 2329 

## 2012-10-30 NOTE — ED Notes (Addendum)
Pt from home with reports of a laceration from a rusty lid that happened this past Wednesday to right bicep. Pt reports that overnight area to right bicep became swollen, red and painful.

## 2012-10-30 NOTE — ED Provider Notes (Signed)
History  This chart was scribed for non-physician practitioner Wynetta Emery, PA-C working with No att. providers found, by Candelaria Stagers, ED Scribe. This patient was seen in room WTR7/WTR7 and the patient's care was started at 6:06 PM   CSN: [id number]  Arrival date & time 10/30/12  1721   None     Chief Complaint  Patient presents with  . Abscess     The history is provided by the patient. No language interpreter was used.   HPI Comments: Charles Manning is a 57 y.o. male who presents to the Emergency Department complaining of an abscess to his right bicep that started three days ago and became worse yesterday.  He has a central abrasion to the abscess which he states he cut on a rusty barrel.  Pt is experiencing redness and swelling to the area.  Pt denies fever, chills, nausea, or vomiting.  He has taken nothing for the sx.  Last tetanus shot unknown.  Pt has h/o diabetes.  He reports h/o IV drug use, however he states that he has not used drugs in the last several months.  Past Medical History  Diagnosis Date  . HTN (hypertension) 09/25/2011  . DM (diabetes mellitus) 09/25/2011  . Degenerative joint disease 09/25/2011  . Depression 09/25/2011  . Anxiety 09/25/2011  . Chronic pain syndrome 09/25/2011  . Basal cell cancer 09/25/2011    Mid back 2007  . Melanoma 09/25/2011    Right thigh 2005    Past Surgical History  Procedure Laterality Date  . Tumor removal from right thigh    . Lymph node removal      Family History  Problem Relation Age of Onset  . Aneurysm Mother   . Cancer Father     History  Substance Use Topics  . Smoking status: Current Every Day Smoker -- 0.50 packs/day    Types: Cigarettes  . Smokeless tobacco: Never Used  . Alcohol Use: Yes     Comment: seldom      Review of Systems  Constitutional: Negative for fever.  Respiratory: Negative for shortness of breath.   Cardiovascular: Negative for chest pain.  Gastrointestinal: Negative for  nausea, vomiting, abdominal pain and diarrhea.  Skin: Positive for wound (abscess to right upper arm).  All other systems reviewed and are negative.    Allergies  Vancomycin  Home Medications   Current Outpatient Rx  Name  Route  Sig  Dispense  Refill  . busPIRone (BUSPAR) 10 MG tablet   Oral   Take 1 tablet (10 mg total) by mouth 3 (three) times daily.   90 tablet   0   . citalopram (CELEXA) 20 MG tablet   Oral   Take 1 tablet (20 mg total) by mouth daily.   30 tablet   0   . clindamycin (CLEOCIN) 300 MG capsule   Oral   Take 1 capsule (300 mg total) by mouth 4 (four) times daily. X 7 days   28 capsule   0   . fenofibrate 160 MG tablet   Oral   Take 1 tablet (160 mg total) by mouth daily.   30 tablet   0   . gabapentin (NEURONTIN) 600 MG tablet   Oral   Take 600 mg by mouth 2 (two) times daily.         . insulin aspart protamine-insulin aspart (NOVOLOG 70/30) (70-30) 100 UNIT/ML injection   Subcutaneous   Inject 60 Units into the skin 2 (two) times daily  with a meal.         . lisinopril (PRINIVIL,ZESTRIL) 10 MG tablet   Oral   Take 10 mg by mouth daily.         . meloxicam (MOBIC) 15 MG tablet   Oral   Take 15 mg by mouth daily.         . naproxen (NAPROSYN) 250 MG tablet   Oral   Take 250 mg by mouth 2 (two) times daily with a meal.         . QUEtiapine (SEROQUEL XR) 150 MG 24 hr tablet   Oral   Take 1 tablet (150 mg total) by mouth daily.   30 each   0   . traZODone (DESYREL) 150 MG tablet   Oral   Take 150 mg by mouth at bedtime.           BP 135/74  Pulse 78  Temp(Src) 99 F (37.2 C) (Oral)  Resp 18  Ht 5\' 10"  (1.778 m)  Wt 165 lb (74.844 kg)  BMI 23.68 kg/m2  SpO2 95%  Physical Exam  Nursing note and vitals reviewed. Constitutional: He is oriented to person, place, and time. He appears well-developed and well-nourished. No distress.  Nonseptic appearing  HENT:  Head: Normocephalic and atraumatic.  Eyes:  Conjunctivae and EOM are normal. Pupils are equal, round, and reactive to light.  Cardiovascular: Normal rate and intact distal pulses.   No murmur heard. Pulmonary/Chest: Effort normal. No stridor.  Abdominal: Soft.  Musculoskeletal: Normal range of motion.  Neurological: He is alert and oriented to person, place, and time.  Skin:  12 cm area of induration (non-circumferential) to right biceps area there is a 3 cm area of fluctuance in the inferior portion with an abrasion overlying it.   Psychiatric: He has a normal mood and affect.      ED Course  Procedures   DIAGNOSTIC STUDIES: Oxygen Saturation is 95% on room air, normal by my interpretation.    COORDINATION OF CARE:  6:08 PM Discussed course of care with pt which includes oral antibiotics and I&D of abscess.  Strict return precautions discussed.  Pt understands and agrees.   INCISION AND DRAINAGE PROCEDURE NOTE: Patient identification was confirmed and verbal consent was obtained. This procedure was performed by Wynetta Emery, PA-C at 7:29 PM. Site: right upper anterior arm Sterile procedures observed Needle size: guage Anesthetic used (type and amt): 2% lidocaine with epi, 3 mL Blade size: 11 blade Drainage: moderate Complexity: Complex Packing put in place (1/4)  Site anesthetized, incision made over site, wound drained and explored loculations, rinsed with copious amounts of normal saline, covered with dry, sterile dressing.  Pt tolerated procedure well without complications.  Instructions for care discussed verbally and pt provided with additional written instructions for homecare and f/u.  Labs Reviewed - No data to display No results found.   1. Abscess and cellulitis       MDM   Filed Vitals:   10/30/12 1801  BP: 135/74  Pulse: 78  Temp: 99 F (37.2 C)  TempSrc: Oral  Resp: 18  Height: 5\' 10"  (1.778 m)  Weight: 165 lb (74.844 kg)  SpO2: 95%     [name] is a 57 y.o. male of  abscess to right biceps. Patient is an insulin-dependent diabetes diabetic and does have a history of IV drug use, however he states that he has not used recently. Wound is incised and drained with significant amount of purulent  drainage. I packed the wound lightly and I have instructed him to come back for wound check in 48 hours. We have gone over extensive return precautions for spreading infection. Patient is verbalizes understanding.  Medications  clindamycin (CLEOCIN) capsule 300 mg (not administered)  morphine 4 MG/ML injection 4 mg (not administered)  LORazepam (ATIVAN) injection 1 mg (not administered)  lidocaine-EPINEPHrine (XYLOCAINE W/EPI) 2 %-1:100000 (with pres) injection 20 mL (not administered)    The patient is hemodynamically stable, appropriate for, and amenable to, discharge at this time. Pt verbalized understanding and agrees with care plan. Outpatient follow-up and return precautions given.    Discharge Medication List as of 10/30/2012  7:59 PM    START taking these medications   Details  clindamycin (CLEOCIN) 150 MG capsule Take 2 capsules (300 mg total) by mouth 3 (three) times daily., Starting 10/30/2012, Until Discontinued, Print        I personally performed the services described in this documentation, which was scribed in my presence. The recorded information has been reviewed and is accurate.         Wynetta Emery, PA-C 10/30/12 2020

## 2012-12-20 ENCOUNTER — Encounter: Payer: Self-pay | Admitting: *Deleted

## 2012-12-27 ENCOUNTER — Encounter (HOSPITAL_COMMUNITY): Payer: Self-pay | Admitting: *Deleted

## 2012-12-27 ENCOUNTER — Emergency Department (HOSPITAL_COMMUNITY)
Admission: EM | Admit: 2012-12-27 | Discharge: 2012-12-28 | Disposition: A | Discharge status: Home / Self Care | Payer: Medicare Other | Attending: Emergency Medicine | Admitting: Emergency Medicine

## 2012-12-27 DIAGNOSIS — Z9889 Other specified postprocedural states: Secondary | ICD-10-CM | POA: Insufficient documentation

## 2012-12-27 DIAGNOSIS — Z79899 Other long term (current) drug therapy: Secondary | ICD-10-CM | POA: Insufficient documentation

## 2012-12-27 DIAGNOSIS — M79609 Pain in unspecified limb: Secondary | ICD-10-CM | POA: Insufficient documentation

## 2012-12-27 DIAGNOSIS — Z794 Long term (current) use of insulin: Secondary | ICD-10-CM | POA: Insufficient documentation

## 2012-12-27 DIAGNOSIS — F17200 Nicotine dependence, unspecified, uncomplicated: Secondary | ICD-10-CM | POA: Insufficient documentation

## 2012-12-27 DIAGNOSIS — M25569 Pain in unspecified knee: Secondary | ICD-10-CM | POA: Insufficient documentation

## 2012-12-27 DIAGNOSIS — M25579 Pain in unspecified ankle and joints of unspecified foot: Secondary | ICD-10-CM | POA: Insufficient documentation

## 2012-12-27 DIAGNOSIS — F411 Generalized anxiety disorder: Secondary | ICD-10-CM | POA: Insufficient documentation

## 2012-12-27 DIAGNOSIS — F329 Major depressive disorder, single episode, unspecified: Secondary | ICD-10-CM | POA: Insufficient documentation

## 2012-12-27 DIAGNOSIS — I890 Lymphedema, not elsewhere classified: Secondary | ICD-10-CM | POA: Insufficient documentation

## 2012-12-27 DIAGNOSIS — Z8589 Personal history of malignant neoplasm of other organs and systems: Secondary | ICD-10-CM | POA: Insufficient documentation

## 2012-12-27 DIAGNOSIS — G8929 Other chronic pain: Secondary | ICD-10-CM

## 2012-12-27 DIAGNOSIS — M25571 Pain in right ankle and joints of right foot: Secondary | ICD-10-CM

## 2012-12-27 DIAGNOSIS — F3289 Other specified depressive episodes: Secondary | ICD-10-CM | POA: Insufficient documentation

## 2012-12-27 DIAGNOSIS — E119 Type 2 diabetes mellitus without complications: Secondary | ICD-10-CM | POA: Insufficient documentation

## 2012-12-27 DIAGNOSIS — Z8739 Personal history of other diseases of the musculoskeletal system and connective tissue: Secondary | ICD-10-CM | POA: Insufficient documentation

## 2012-12-27 DIAGNOSIS — I10 Essential (primary) hypertension: Secondary | ICD-10-CM | POA: Insufficient documentation

## 2012-12-27 DIAGNOSIS — Z85820 Personal history of malignant melanoma of skin: Secondary | ICD-10-CM | POA: Insufficient documentation

## 2012-12-27 NOTE — ED Notes (Addendum)
Pt reports hx of lymphedema to rt leg d/t surgery on groin for melanoma - pt admits to hx of chronic pain and swelling to extremity - pt states x2 days pain has been worse. Pt uses a cane to ambulate normally. Rt ankle noted to be edematous on assessment. Pt admits he has been taking acetaminophen "by the handfuls" - pt states he has been taking x3 extra strength tylenol 3-4 times today.

## 2012-12-27 NOTE — ED Provider Notes (Signed)
History    This chart was scribed for non-physician practitioner Trixie Dredge, PA-C, working with Gerhard Munch, MD by Donne Anon, ED Scribe. This patient was seen in room WTR5/WTR5 and the patient's care was started at 2337.  CSN: [id number] Arrival date & time 12/27/12  2332  First MD Initiated Contact with Patient 12/27/12 2337     Chief Complaint  Patient presents with  . Leg Pain  . Leg Swelling    The history is provided by the patient. No language interpreter was used.   HPI Comments: Charles Manning is a 57 y.o. male with hx of right leg lymphedema after a groin dissection for melanoma in 2005 presents to the Emergency Department complaining of 9 years of gradual onset, gradually worsening, moderate "sharp" right ankle pain and swelling that began after he had his groin disection in 2005 and worsened over the past few months and even more over 2 days. The pain feels similar to his chronic pain, is in the same location and same quality, just more severe.  He has been taking "handfuls" of extra-strengthTylenol for the past three days with little relief. When asked, he states about 9 pills/day. Walking and using the ankle make the pain worse. He tried elevating his leg with little relief. He also has chronic pain in his right calf and right knee. He denies fever, chills, body aches, unexpected weight loss, weakness or numbness beyond baseline in his extremities, left leg pain, CP, SOB, abdominal pain, nausea, vomiting or any other pain. He denies SI or HI. Denies recent injury or trauma.   Dr. Marva Panda is his PCP. He has spoken with her about this issue in the past and she has prescribed Tramadol and Tylenol 3 with little relief.   Past Medical History  Diagnosis Date  . HTN (hypertension) 09/25/2011  . DM (diabetes mellitus) 09/25/2011  . Degenerative joint disease 09/25/2011  . Depression 09/25/2011  . Anxiety 09/25/2011  . Chronic pain syndrome 09/25/2011  . Basal cell  cancer 09/25/2011    Mid back 2007  . Melanoma 09/25/2011    Right thigh 2005   Past Surgical History  Procedure Laterality Date  . Tumor removal from right thigh    . Lymph node removal     Family History  Problem Relation Age of Onset  . Aneurysm Mother   . Cancer Father    History  Substance Use Topics  . Smoking status: Current Every Day Smoker -- 0.50 packs/day    Types: Cigarettes  . Smokeless tobacco: Never Used  . Alcohol Use: Yes     Comment: seldom    Review of Systems  Constitutional: Negative for fever, chills and unexpected weight change.       Occasional night sweats (approximately once a week) x months  Respiratory: Negative for shortness of breath.   Cardiovascular: Negative for chest pain.  Gastrointestinal: Negative for nausea, vomiting, abdominal pain and diarrhea.  Musculoskeletal: Positive for joint swelling and arthralgias.  Skin: Negative for color change.  Neurological: Negative for weakness and numbness.  Psychiatric/Behavioral: Negative for suicidal ideas.    Allergies  Vancomycin  Home Medications   Current Outpatient Rx  Name  Route  Sig  Dispense  Refill  . busPIRone (BUSPAR) 10 MG tablet   Oral   Take 10 mg by mouth daily.         . citalopram (CELEXA) 20 MG tablet   Oral   Take 1 tablet (20 mg total) by  mouth daily.   30 tablet   0   . clindamycin (CLEOCIN) 150 MG capsule   Oral   Take 2 capsules (300 mg total) by mouth 3 (three) times daily.   60 capsule   0   . fenofibrate 160 MG tablet   Oral   Take 1 tablet (160 mg total) by mouth daily.   30 tablet   0   . gabapentin (NEURONTIN) 600 MG tablet   Oral   Take 600 mg by mouth 2 (two) times daily.         Marland Kitchen glimepiride (AMARYL) 4 MG tablet   Oral   Take 4 mg by mouth daily before breakfast.         . insulin aspart protamine-insulin aspart (NOVOLOG 70/30) (70-30) 100 UNIT/ML injection   Subcutaneous   Inject 50 Units into the skin 2 (two) times daily with a  meal.          . lisinopril (PRINIVIL,ZESTRIL) 10 MG tablet   Oral   Take 10 mg by mouth daily.         . meloxicam (MOBIC) 15 MG tablet   Oral   Take 15 mg by mouth daily.         . naproxen (NAPROSYN) 250 MG tablet   Oral   Take 250 mg by mouth 2 (two) times daily with a meal.         . QUEtiapine (SEROQUEL XR) 150 MG 24 hr tablet   Oral   Take 1 tablet (150 mg total) by mouth daily.   30 each   0   . traZODone (DESYREL) 150 MG tablet   Oral   Take 150 mg by mouth at bedtime.          BP 148/83  Pulse 100  Temp(Src) 98.4 F (36.9 C) (Oral)  Resp 20  SpO2 99%  Physical Exam  Nursing note and vitals reviewed. Constitutional: He appears well-developed and well-nourished. No distress.  HENT:  Head: Normocephalic and atraumatic.  Neck: Neck supple.  Pulmonary/Chest: Effort normal.  Musculoskeletal:       Right ankle: He exhibits swelling. He exhibits no ecchymosis, no deformity, no laceration and normal pulse. Tenderness.       Right foot: He exhibits no tenderness and no bony tenderness.       Feet:  Right lower extremity with edema to proximal shin.  No calf tenderness.  Distal sensation intact, distal pulses intact.   Neurological: He is alert.  Skin: He is not diaphoretic.    ED Course  Procedures (including critical care time) DIAGNOSTIC STUDIES: Oxygen Saturation is 99% on RA, normal by my interpretation.    COORDINATION OF CARE: 11:45 PM Discussed treatment plan which includes labs and medication with pt at bedside and pt agreed to plan.   Labs Reviewed  COMPREHENSIVE METABOLIC PANEL - Abnormal; Notable for the following:    Sodium 133 (*)    Glucose, Bld 272 (*)    BUN 24 (*)    Albumin 3.3 (*)    AST 40 (*)    ALT 54 (*)    Total Bilirubin 0.2 (*)    All other components within normal limits  ACETAMINOPHEN LEVEL   No results found. 1. Chronic acquired lymphedema   2. Chronic pain   3. Ankle pain, chronic, right     MDM  Pt  with chronic right lower extremity edema and chronic pain.  Pt has had gradual worsening of his pain  without injury over months, and even worse over the past two days.  There is no change in the quality or location of his pain.  Doubt any new process at work.  Pt is not on chronic narcotic pain medication, per DEA database vicodin prescription in February is the only one in 6 months.  Patient states he is taking "handfuls" of tylenol over the past 3 days, estimates 9/day, which is just over the recommended daily limit at 4500mg .  Pt also with hyperglycemia, pt is diabetic and has medications at home.  LFTs mildly elevated.  APAP level is a normal range.  Reviewed labs with Dr Adriana Simas.  Pt d/c home with #12 oxycodone, advised not to take tylenol until he gets rechecked by his PCP.  Discussed  findings, treatment, follow up with patient.  Pt given return precautions.  Pt verbalizes understanding and agrees with plan.     I doubt any other EMC precluding discharge at this time including, but not necessarily limited to the following: DVT, cellulitis  I personally performed the services described in this documentation, which was scribed in my presence. The recorded information has been reviewed and is accurate.    [place], PA-C 12/28/12 [phone]

## 2012-12-28 LAB — COMPREHENSIVE METABOLIC PANEL
AST: 40 U/L — ABNORMAL HIGH (ref 0–37)
CO2: 22 mEq/L (ref 19–32)
Calcium: 9.5 mg/dL (ref 8.4–10.5)
Creatinine, Ser: 0.76 mg/dL (ref 0.50–1.35)
GFR calc non Af Amer: >90 mL/min (ref >90)

## 2012-12-28 MED ORDER — OXYCODONE-ACETAMINOPHEN 5-325 MG PO TABS: 1.0000 | ORAL_TABLET | ORAL | Status: DC | PRN

## 2012-12-28 MED ORDER — HYDROCODONE-ACETAMINOPHEN 5-325 MG PO TABS: 1.0000 | ORAL_TABLET | ORAL | Status: DC | PRN

## 2012-12-28 MED ORDER — OXYCODONE HCL 5 MG PO TABS: 5.0000 mg | ORAL_TABLET | ORAL | Status: DC | PRN

## 2012-12-28 NOTE — ED Notes (Signed)
Pt ambulating independently w/ steady gait on d/c in no acute distress, A&Ox4. D/c instructions reviewed w/ pt and family - pt and family deny any further questions or concerns at present. Rx given x1, Pt instructed to not use alcohol, drive, or operate heavy machinery while take the prescription pain medications as they could make him drowsy - pt verbalized understanding.    

## 2012-12-28 NOTE — ED Provider Notes (Signed)
Medical screening examination/treatment/procedure(s) were performed by non-physician practitioner and as supervising physician I was immediately available for consultation/collaboration.  Donnetta Hutching, MD 12/28/12 [phone]

## 2013-02-16 ENCOUNTER — Emergency Department (HOSPITAL_COMMUNITY)
Admission: EM | Admit: 2013-02-16 | Discharge: 2013-02-16 | Disposition: A | Discharge status: Home / Self Care | Payer: Medicare Other | Attending: Emergency Medicine | Admitting: Emergency Medicine

## 2013-02-16 ENCOUNTER — Encounter (HOSPITAL_COMMUNITY): Payer: Self-pay | Admitting: *Deleted

## 2013-02-16 ENCOUNTER — Emergency Department (HOSPITAL_COMMUNITY): Payer: Medicare Other

## 2013-02-16 DIAGNOSIS — E119 Type 2 diabetes mellitus without complications: Secondary | ICD-10-CM | POA: Insufficient documentation

## 2013-02-16 DIAGNOSIS — Y9389 Activity, other specified: Secondary | ICD-10-CM | POA: Insufficient documentation

## 2013-02-16 DIAGNOSIS — Z79899 Other long term (current) drug therapy: Secondary | ICD-10-CM | POA: Insufficient documentation

## 2013-02-16 DIAGNOSIS — G894 Chronic pain syndrome: Secondary | ICD-10-CM | POA: Insufficient documentation

## 2013-02-16 DIAGNOSIS — M19071 Primary osteoarthritis, right ankle and foot: Secondary | ICD-10-CM

## 2013-02-16 DIAGNOSIS — Z791 Long term (current) use of non-steroidal anti-inflammatories (NSAID): Secondary | ICD-10-CM | POA: Insufficient documentation

## 2013-02-16 DIAGNOSIS — F17200 Nicotine dependence, unspecified, uncomplicated: Secondary | ICD-10-CM | POA: Insufficient documentation

## 2013-02-16 DIAGNOSIS — F3289 Other specified depressive episodes: Secondary | ICD-10-CM | POA: Insufficient documentation

## 2013-02-16 DIAGNOSIS — M1990 Unspecified osteoarthritis, unspecified site: Secondary | ICD-10-CM | POA: Insufficient documentation

## 2013-02-16 DIAGNOSIS — M25473 Effusion, unspecified ankle: Secondary | ICD-10-CM | POA: Insufficient documentation

## 2013-02-16 DIAGNOSIS — X500XXA Overexertion from strenuous movement or load, initial encounter: Secondary | ICD-10-CM | POA: Insufficient documentation

## 2013-02-16 DIAGNOSIS — Y9289 Other specified places as the place of occurrence of the external cause: Secondary | ICD-10-CM | POA: Insufficient documentation

## 2013-02-16 DIAGNOSIS — F329 Major depressive disorder, single episode, unspecified: Secondary | ICD-10-CM | POA: Insufficient documentation

## 2013-02-16 DIAGNOSIS — Z794 Long term (current) use of insulin: Secondary | ICD-10-CM | POA: Insufficient documentation

## 2013-02-16 DIAGNOSIS — I890 Lymphedema, not elsewhere classified: Secondary | ICD-10-CM | POA: Insufficient documentation

## 2013-02-16 DIAGNOSIS — I10 Essential (primary) hypertension: Secondary | ICD-10-CM | POA: Insufficient documentation

## 2013-02-16 DIAGNOSIS — M25476 Effusion, unspecified foot: Secondary | ICD-10-CM | POA: Insufficient documentation

## 2013-02-16 DIAGNOSIS — M19079 Primary osteoarthritis, unspecified ankle and foot: Secondary | ICD-10-CM | POA: Insufficient documentation

## 2013-02-16 DIAGNOSIS — Z85820 Personal history of malignant melanoma of skin: Secondary | ICD-10-CM | POA: Insufficient documentation

## 2013-02-16 DIAGNOSIS — F411 Generalized anxiety disorder: Secondary | ICD-10-CM | POA: Insufficient documentation

## 2013-02-16 DIAGNOSIS — S8392XA Sprain of unspecified site of left knee, initial encounter: Secondary | ICD-10-CM

## 2013-02-16 DIAGNOSIS — IMO0002 Reserved for concepts with insufficient information to code with codable children: Secondary | ICD-10-CM | POA: Insufficient documentation

## 2013-02-16 MED ORDER — MELOXICAM 7.5 MG PO TABS: 15.0000 mg | ORAL_TABLET | Freq: Every day | ORAL | Status: DC

## 2013-02-16 NOTE — ED Notes (Signed)
Pt states twisted knee a couple days ago, then yesterday twisted it again, states having L knee swelling and pain. Then having R ankle swelling which is at times normal for him d/t previous lymphedema in that ankle.

## 2013-02-16 NOTE — ED Provider Notes (Signed)
Medical screening examination/treatment/procedure(s) were performed by non-physician practitioner and as supervising physician I was immediately available for consultation/collaboration.   Laray Anger, DO 02/16/13 2300

## 2013-02-16 NOTE — ED Provider Notes (Signed)
This chart was scribed for Cornelia Copa, a non-physician practitioner working with Laray Anger, DO by Lewanda Rife, ED Scribe. This patient was seen in room WTR8/WTR8 and the patient's care was started at 2008.    CSN: [id number]     Arrival date & time 02/16/13  1740 History   None    Chief Complaint  Patient presents with  . Knee Injury    left  . Joint Swelling    right    The history is provided by the patient.   HPI Comments: Charles Manning is a 57 y.o. male who presents to the Emergency Department complaining of constant moderate left knee pain onset acute 4 days ago after twisting mechanism while walking. Reports associated swelling. Reports chronic right lower leg pain and persistent swelling secondary to lymphedema. Denies associated numbness, weakness, and fever. Reports pain is exacerbated by walking and alleviated by nothing. Denies trying any medications to relieve symptoms. Reports uses a cane at baseline. Reports PMHx of DM, arthritis, and HTN.   Past Medical History  Diagnosis Date  . HTN (hypertension) 09/25/2011  . DM (diabetes mellitus) 09/25/2011  . Degenerative joint disease 09/25/2011  . Depression 09/25/2011  . Anxiety 09/25/2011  . Chronic pain syndrome 09/25/2011  . Basal cell cancer 09/25/2011    Mid back 2007  . Melanoma 09/25/2011    Right thigh 2005   Past Surgical History  Procedure Laterality Date  . Tumor removal from right thigh    . Lymph node removal     Family History  Problem Relation Age of Onset  . Aneurysm Mother   . Cancer Father    History  Substance Use Topics  . Smoking status: Current Every Day Smoker -- 0.50 packs/day    Types: Cigarettes  . Smokeless tobacco: Never Used  . Alcohol Use: Yes     Comment: seldom    Review of Systems  Musculoskeletal: Positive for joint swelling.  A complete 10 system review of systems was obtained and all systems are negative except as noted in the HPI and PMH.     Allergies   Vancomycin  Home Medications   Current Outpatient Rx  Name  Route  Sig  Dispense  Refill  . acetaminophen (TYLENOL) 500 MG tablet   Oral   Take 500 mg by mouth every 6 (six) hours as needed for pain.         . citalopram (CELEXA) 20 MG tablet   Oral   Take 1 tablet (20 mg total) by mouth daily.   30 tablet   0   . gabapentin (NEURONTIN) 600 MG tablet   Oral   Take 600 mg by mouth 2 (two) times daily.         . insulin aspart protamine-insulin aspart (NOVOLOG 70/30) (70-30) 100 UNIT/ML injection   Subcutaneous   Inject 50 Units into the skin 2 (two) times daily with a meal.          . lisinopril (PRINIVIL,ZESTRIL) 10 MG tablet   Oral   Take 10 mg by mouth daily.         . meloxicam (MOBIC) 15 MG tablet   Oral   Take 15 mg by mouth daily.         . QUEtiapine (SEROQUEL XR) 150 MG 24 hr tablet   Oral   Take 1 tablet (150 mg total) by mouth daily.   30 each   0    BP 136/86  Pulse  69  Temp(Src) 97.9 F (36.6 C) (Oral)  Resp 18  Ht 5\' 10"  (1.778 m)  Wt 177 lb (80.287 kg)  BMI 25.4 kg/m2  SpO2 95% Physical Exam  Nursing note and vitals reviewed. Constitutional: He is oriented to person, place, and time. He appears well-developed and well-nourished. No distress.  HENT:  Head: Normocephalic and atraumatic.  Eyes: EOM are normal.  Neck: Neck supple. No tracheal deviation present.  Cardiovascular: Normal rate and regular rhythm.   Pulses:      Dorsalis pedis pulses are 2+ on the left side.       Posterior tibial pulses are 2+ on the left side.  Pulmonary/Chest: Effort normal and breath sounds normal. No respiratory distress.  Musculoskeletal:       Left knee: He exhibits decreased range of motion (slightly secondary to pain ), swelling and effusion. He exhibits no erythema and no MCL laxity. Tenderness found.  Negative anterior and posterior drawer test of left knee. No increased laxity with varus or valgus stress.  Diffuse swelling into the ankle and  to the foot of right lower leg. No erythema or warmth to site.    Neurological: He is alert and oriented to person, place, and time.  Skin: Skin is warm and dry.  Psychiatric: He has a normal mood and affect. His behavior is normal.    ED Course  Procedures    Imaging Review Dg Ankle Complete Right  02/16/2013   CLINICAL DATA:  Chronic right ankle pain and swelling really bad last 3 days, history arthritis, hypertension, diabetes  EXAM: RIGHT ANKLE - COMPLETE 3+ VIEW  COMPARISON:  None  FINDINGS: Osseous demineralization.  Advanced degenerative changes of the right ankle joint with joint space narrowing, minimal spur formation, and subchondral cyst formation.  Mild widening of medial ankle mortise.  Diffuse regional soft tissue swelling.  No acute fracture, dislocation or bone destruction.  IMPRESSION: Advanced degenerative changes right ankle.  Osseous demineralization and diffuse regional soft tissue swelling.  No acute fracture dislocation.   Electronically Signed   By: Ulyses Southward M.D.   On: 02/16/2013 18:32   Dg Knee Complete 4 Views Left  02/16/2013   *RADIOLOGY REPORT*  Clinical Data: Left knee pain and swelling  LEFT KNEE - COMPLETE 4+ VIEW  Comparison: None.  Findings: A moderate joint effusion is noted.  Degenerative changes are seen most marked in the patellofemoral space.  No acute fracture or dislocation is noted.  IMPRESSION: Degenerative change with joint effusion   Original Report Authenticated By: Alcide Clever, M.D.    MDM   1. Knee sprain, left, initial encounter   2. Lymphedema   3. Arthritis of ankle, right     Patient seen and evaluated. He appears well. X-rays reviewed without any signs of fractures or other concerning acute injuries. Patient has history of chronic pain and swelling of the right leg and ankle. Will recommend Rice therapy and provide orthopedic referral.   I personally performed the services described in this documentation, which was scribed in my  presence. The recorded information has been reviewed and is accurate.    Angus Seller, PA-C 02/16/13 2035

## 2013-02-17 ENCOUNTER — Emergency Department (HOSPITAL_COMMUNITY)
Admission: EM | Admit: 2013-02-17 | Discharge: 2013-02-17 | Disposition: A | Discharge status: Home / Self Care | Payer: Medicare Other | Attending: Emergency Medicine | Admitting: Emergency Medicine

## 2013-02-17 ENCOUNTER — Encounter (HOSPITAL_COMMUNITY): Payer: Self-pay | Admitting: Emergency Medicine

## 2013-02-17 DIAGNOSIS — F411 Generalized anxiety disorder: Secondary | ICD-10-CM | POA: Insufficient documentation

## 2013-02-17 DIAGNOSIS — G8929 Other chronic pain: Secondary | ICD-10-CM | POA: Insufficient documentation

## 2013-02-17 DIAGNOSIS — F329 Major depressive disorder, single episode, unspecified: Secondary | ICD-10-CM | POA: Insufficient documentation

## 2013-02-17 DIAGNOSIS — M25562 Pain in left knee: Secondary | ICD-10-CM

## 2013-02-17 DIAGNOSIS — R52 Pain, unspecified: Secondary | ICD-10-CM | POA: Insufficient documentation

## 2013-02-17 DIAGNOSIS — Z791 Long term (current) use of non-steroidal anti-inflammatories (NSAID): Secondary | ICD-10-CM | POA: Insufficient documentation

## 2013-02-17 DIAGNOSIS — M25569 Pain in unspecified knee: Secondary | ICD-10-CM | POA: Insufficient documentation

## 2013-02-17 DIAGNOSIS — Z85820 Personal history of malignant melanoma of skin: Secondary | ICD-10-CM | POA: Insufficient documentation

## 2013-02-17 DIAGNOSIS — I890 Lymphedema, not elsewhere classified: Secondary | ICD-10-CM | POA: Insufficient documentation

## 2013-02-17 DIAGNOSIS — M1990 Unspecified osteoarthritis, unspecified site: Secondary | ICD-10-CM | POA: Insufficient documentation

## 2013-02-17 DIAGNOSIS — Z794 Long term (current) use of insulin: Secondary | ICD-10-CM | POA: Insufficient documentation

## 2013-02-17 DIAGNOSIS — F3289 Other specified depressive episodes: Secondary | ICD-10-CM | POA: Insufficient documentation

## 2013-02-17 DIAGNOSIS — Z79899 Other long term (current) drug therapy: Secondary | ICD-10-CM | POA: Insufficient documentation

## 2013-02-17 DIAGNOSIS — F17200 Nicotine dependence, unspecified, uncomplicated: Secondary | ICD-10-CM | POA: Insufficient documentation

## 2013-02-17 DIAGNOSIS — M79609 Pain in unspecified limb: Secondary | ICD-10-CM | POA: Insufficient documentation

## 2013-02-17 DIAGNOSIS — I10 Essential (primary) hypertension: Secondary | ICD-10-CM | POA: Insufficient documentation

## 2013-02-17 DIAGNOSIS — G894 Chronic pain syndrome: Secondary | ICD-10-CM | POA: Insufficient documentation

## 2013-02-17 DIAGNOSIS — E119 Type 2 diabetes mellitus without complications: Secondary | ICD-10-CM | POA: Insufficient documentation

## 2013-02-17 MED ORDER — OXYCODONE-ACETAMINOPHEN 5-325 MG PO TABS
ORAL_TABLET | ORAL | Status: DC
Start: 2013-02-17 — End: 2013-06-24

## 2013-02-17 NOTE — ED Notes (Addendum)
Pt reports being seen last night for left knee pain. Pt states the pain has gotten worse since last night. Pt also reports right ankle pain. Pt states that he was more mobile last night, and today he states the pain makes it difficult to ambulate. Capillary refill is less than 3 seconds and distal pulses are strong. Pt is A/O x4 and in NAD.

## 2013-02-17 NOTE — ED Provider Notes (Signed)
CSN: [id number]     Arrival date & time 02/17/13  1141 History   First MD Initiated Contact with Patient 02/17/13 1201     Chief Complaint  Patient presents with  . Knee Pain    Left   (Consider location/radiation/quality/duration/timing/severity/associated sxs/prior Treatment) HPI Pt is a 57yo male with hx of DJD, lymphedema, and chronic pain syndrome c/o acute onset left knee pain after twisting it 5 days ago by stepping wrong with his right leg for which he has lymphedema.  Pain is constant, 10/10, aching throbbing pain associated with swelling and stiffness of knee.  Pt states he was seen here last night for same and advised to return if pain worsened.  Pt states he has never felt pain like this before and is barely able to ambulate.  Admits to having a cane for chronic lymphedema or right leg, however new pain in left knee has made it extremely difficult.  Has tried meloxicam w/o relief.  Denies fever, redness, or warmth of left knee. States he does plan to call orthopedist but has not been able to since discharge last night.  Requesting better pain relief until he can be seen.   Past Medical History  Diagnosis Date  . HTN (hypertension) 09/25/2011  . DM (diabetes mellitus) 09/25/2011  . Degenerative joint disease 09/25/2011  . Depression 09/25/2011  . Anxiety 09/25/2011  . Chronic pain syndrome 09/25/2011  . Basal cell cancer 09/25/2011    Mid back 2007  . Melanoma 09/25/2011    Right thigh 2005   Past Surgical History  Procedure Laterality Date  . Tumor removal from right thigh    . Lymph node removal     Family History  Problem Relation Age of Onset  . Aneurysm Mother   . Cancer Father    History  Substance Use Topics  . Smoking status: Current Every Day Smoker -- 0.50 packs/day    Types: Cigarettes  . Smokeless tobacco: Never Used  . Alcohol Use: Yes     Comment: seldom    Review of Systems  Constitutional: Negative for fever and chills.  Musculoskeletal: Positive for  myalgias, joint swelling, arthralgias and gait problem. Negative for back pain.  Skin: Negative for rash and wound.  All other systems reviewed and are negative.    Allergies  Vancomycin  Home Medications   Current Outpatient Rx  Name  Route  Sig  Dispense  Refill  . acetaminophen (TYLENOL) 500 MG tablet   Oral   Take 1,000 mg by mouth every 6 (six) hours as needed for pain.          . citalopram (CELEXA) 20 MG tablet   Oral   Take 1 tablet (20 mg total) by mouth daily.   30 tablet   0   . gabapentin (NEURONTIN) 600 MG tablet   Oral   Take 600 mg by mouth at bedtime.          . insulin aspart protamine-insulin aspart (NOVOLOG 70/30) (70-30) 100 UNIT/ML injection   Subcutaneous   Inject 50 Units into the skin 2 (two) times daily with a meal.          . lisinopril (PRINIVIL,ZESTRIL) 10 MG tablet   Oral   Take 10 mg by mouth daily.         . meloxicam (MOBIC) 15 MG tablet   Oral   Take 15 mg by mouth daily.         . QUEtiapine Fumarate (SEROQUEL XR) 150  MG 24 hr tablet   Oral   Take 150 mg by mouth at bedtime.         Marland Kitchen oxyCODONE-acetaminophen (PERCOCET/ROXICET) 5-325 MG per tablet      Take 1-2 pills every 4-6 hours as needed for pain.   10 tablet   0    BP 125/78  Pulse 71  Temp(Src) 97.9 F (36.6 C) (Oral)  Resp 20  Ht 5\' 10"  (1.778 m)  SpO2 98% Physical Exam  Nursing note and vitals reviewed. Constitutional: He is oriented to person, place, and time. He appears well-developed and well-nourished.  HENT:  Head: Normocephalic and atraumatic.  Eyes: EOM are normal.  Neck: Normal range of motion.  Cardiovascular: Normal rate.   Pulmonary/Chest: Effort normal.  Musculoskeletal: He exhibits edema and tenderness.       Left knee: He exhibits decreased range of motion and swelling. He exhibits no ecchymosis, no deformity, no laceration and no erythema. Tenderness found.       Legs: Moderate edema of right leg including knee, calf, and ankle  (chronic, lymphedema).  Moderate swelling and stiffness of left knee.  TTP superior to patella, and bilateral inferior pole of patella.  Antalgic gait. Pedal pulse 2+. Nl sensation to light touch.  Skin in tact, no ecchymosis, erythema, or warmth.  Neurological: He is alert and oriented to person, place, and time.  Skin: Skin is warm and dry.  Psychiatric: He has a normal mood and affect. His behavior is normal.    ED Course  Procedures (including critical care time) Labs Review Labs Reviewed - No data to display Imaging Review Dg Ankle Complete Right  02/16/2013   CLINICAL DATA:  Chronic right ankle pain and swelling really bad last 3 days, history arthritis, hypertension, diabetes  EXAM: RIGHT ANKLE - COMPLETE 3+ VIEW  COMPARISON:  None  FINDINGS: Osseous demineralization.  Advanced degenerative changes of the right ankle joint with joint space narrowing, minimal spur formation, and subchondral cyst formation.  Mild widening of medial ankle mortise.  Diffuse regional soft tissue swelling.  No acute fracture, dislocation or bone destruction.  IMPRESSION: Advanced degenerative changes right ankle.  Osseous demineralization and diffuse regional soft tissue swelling.  No acute fracture dislocation.   Electronically Signed   By: Ulyses Southward M.D.   On: 02/16/2013 18:32   Dg Knee Complete 4 Views Left  02/16/2013   *RADIOLOGY REPORT*  Clinical Data: Left knee pain and swelling  LEFT KNEE - COMPLETE 4+ VIEW  Comparison: None.  Findings: A moderate joint effusion is noted.  Degenerative changes are seen most marked in the patellofemoral space.  No acute fracture or dislocation is noted.  IMPRESSION: Degenerative change with joint effusion   Original Report Authenticated By: Alcide Clever, M.D.    MDM   1. Acute knee pain, left   2. Chronic leg pain, right   3. Lymphedema of leg    Pt c/o constant, worsening left knee pain after twisting it wrong 5 days ago.  States he has noticed increased pain and  swelling but denies new injuries since being evaluated yesterday.  No warmth, erythema or ecchymosis. Advised pt to f/u with orthopedics next week for further evaluation and treatment of knee pain.  Rx: crutches, percocet.  Pt still has knee sleeve from yesterday.     Junius Finner, PA-C 02/17/13 1625

## 2013-02-18 NOTE — ED Provider Notes (Signed)
Medical screening examination/treatment/procedure(s) were performed by non-physician practitioner and as supervising physician I was immediately available for consultation/collaboration.   Megan E Docherty, MD 02/18/13 1413 

## 2013-04-13 ENCOUNTER — Other Ambulatory Visit: Payer: Self-pay

## 2013-06-24 ENCOUNTER — Emergency Department (HOSPITAL_COMMUNITY): Payer: Medicare Other

## 2013-06-24 ENCOUNTER — Emergency Department (HOSPITAL_COMMUNITY)
Admission: EM | Admit: 2013-06-24 | Discharge: 2013-06-25 | Disposition: A | Discharge status: Home / Self Care | Payer: Medicare Other | Attending: Emergency Medicine | Admitting: Emergency Medicine

## 2013-06-24 ENCOUNTER — Encounter (HOSPITAL_COMMUNITY): Payer: Self-pay | Admitting: Emergency Medicine

## 2013-06-24 DIAGNOSIS — I10 Essential (primary) hypertension: Secondary | ICD-10-CM | POA: Insufficient documentation

## 2013-06-24 DIAGNOSIS — G2589 Other specified extrapyramidal and movement disorders: Secondary | ICD-10-CM | POA: Insufficient documentation

## 2013-06-24 DIAGNOSIS — E876 Hypokalemia: Secondary | ICD-10-CM

## 2013-06-24 DIAGNOSIS — IMO0002 Reserved for concepts with insufficient information to code with codable children: Secondary | ICD-10-CM | POA: Insufficient documentation

## 2013-06-24 DIAGNOSIS — R6889 Other general symptoms and signs: Secondary | ICD-10-CM | POA: Insufficient documentation

## 2013-06-24 DIAGNOSIS — R197 Diarrhea, unspecified: Secondary | ICD-10-CM | POA: Insufficient documentation

## 2013-06-24 DIAGNOSIS — Z85820 Personal history of malignant melanoma of skin: Secondary | ICD-10-CM | POA: Insufficient documentation

## 2013-06-24 DIAGNOSIS — Z794 Long term (current) use of insulin: Secondary | ICD-10-CM | POA: Insufficient documentation

## 2013-06-24 DIAGNOSIS — F411 Generalized anxiety disorder: Secondary | ICD-10-CM | POA: Insufficient documentation

## 2013-06-24 DIAGNOSIS — E119 Type 2 diabetes mellitus without complications: Secondary | ICD-10-CM | POA: Insufficient documentation

## 2013-06-24 DIAGNOSIS — Z79899 Other long term (current) drug therapy: Secondary | ICD-10-CM | POA: Insufficient documentation

## 2013-06-24 DIAGNOSIS — Z85828 Personal history of other malignant neoplasm of skin: Secondary | ICD-10-CM | POA: Insufficient documentation

## 2013-06-24 DIAGNOSIS — Z791 Long term (current) use of non-steroidal anti-inflammatories (NSAID): Secondary | ICD-10-CM | POA: Insufficient documentation

## 2013-06-24 DIAGNOSIS — F3289 Other specified depressive episodes: Secondary | ICD-10-CM | POA: Insufficient documentation

## 2013-06-24 DIAGNOSIS — R252 Cramp and spasm: Secondary | ICD-10-CM

## 2013-06-24 DIAGNOSIS — F329 Major depressive disorder, single episode, unspecified: Secondary | ICD-10-CM | POA: Insufficient documentation

## 2013-06-24 DIAGNOSIS — F17200 Nicotine dependence, unspecified, uncomplicated: Secondary | ICD-10-CM | POA: Insufficient documentation

## 2013-06-24 LAB — CBC WITH DIFFERENTIAL/PLATELET
BASOS PCT: 0 % (ref 0–1)
Basophils Absolute: 0 10*3/uL (ref 0.0–0.1)
EOS ABS: 0.5 10*3/uL (ref 0.0–0.7)
Eosinophils Relative: 3 % (ref 0–5)
HEMATOCRIT: 43.4 % (ref 39.0–52.0)
HEMOGLOBIN: 15.8 g/dL (ref 13.0–17.0)
Lymphocytes Relative: 21 % (ref 12–46)
Lymphs Abs: 3.7 10*3/uL (ref 0.7–4.0)
MCH: 31.3 pg (ref 26.0–34.0)
MCHC: 36.4 g/dL — AB (ref 30.0–36.0)
MCV: 86.1 fL (ref 78.0–100.0)
MONO ABS: 1.4 10*3/uL — AB (ref 0.1–1.0)
Monocytes Relative: 8 % (ref 3–12)
NEUTROS ABS: 12.2 10*3/uL — AB (ref 1.7–7.7)
NEUTROS PCT: 68 % (ref 43–77)
Platelets: 281 10*3/uL (ref 150–400)
RBC: 5.04 MIL/uL (ref 4.22–5.81)
RDW: 12.3 % (ref 11.5–15.5)
WBC: 17.8 10*3/uL — ABNORMAL HIGH (ref 4.0–10.5)

## 2013-06-24 LAB — BASIC METABOLIC PANEL
BUN: 31 mg/dL — AB (ref 6–23)
CHLORIDE: 98 meq/L (ref 96–112)
CO2: 23 meq/L (ref 19–32)
CREATININE: 0.89 mg/dL (ref 0.50–1.35)
Calcium: 9.3 mg/dL (ref 8.4–10.5)
GFR calc Af Amer: >90 mL/min (ref >90)
GFR calc non Af Amer: >90 mL/min (ref >90)
Glucose, Bld: 202 mg/dL — ABNORMAL HIGH (ref 70–99)
Potassium: 3.6 mEq/L — ABNORMAL LOW (ref 3.7–5.3)
Sodium: 135 mEq/L — ABNORMAL LOW (ref 137–147)

## 2013-06-24 LAB — URINALYSIS, ROUTINE W REFLEX MICROSCOPIC
BILIRUBIN URINE: NEGATIVE
Glucose, UA: >1000 mg/dL — AB
HGB URINE DIPSTICK: NEGATIVE
Ketones, ur: NEGATIVE mg/dL
Leukocytes, UA: NEGATIVE
Nitrite: NEGATIVE
PROTEIN: NEGATIVE mg/dL
SPECIFIC GRAVITY, URINE: 1.038 — AB (ref 1.005–1.030)
UROBILINOGEN UA: 0.2 mg/dL (ref 0.0–1.0)
pH: 5 (ref 5.0–8.0)

## 2013-06-24 LAB — HEPATIC FUNCTION PANEL
ALK PHOS: 81 U/L (ref 39–117)
ALT: 97 U/L — AB (ref 0–53)
AST: 72 U/L — ABNORMAL HIGH (ref 0–37)
Albumin: 3.5 g/dL (ref 3.5–5.2)
Total Bilirubin: 0.5 mg/dL (ref 0.3–1.2)
Total Protein: 7 g/dL (ref 6.0–8.3)

## 2013-06-24 LAB — GLUCOSE, CAPILLARY: Glucose-Capillary: 216 mg/dL — ABNORMAL HIGH (ref 70–99)

## 2013-06-24 LAB — URINE MICROSCOPIC-ADD ON

## 2013-06-24 LAB — CK: Total CK: 109 U/L (ref 7–232)

## 2013-06-24 LAB — CG4 I-STAT (LACTIC ACID): Lactic Acid, Venous: 2.02 mmol/L (ref 0.5–2.2)

## 2013-06-24 MED ORDER — POTASSIUM CHLORIDE CRYS ER 20 MEQ PO TBCR
40.0000 meq | EXTENDED_RELEASE_TABLET | Freq: Once | ORAL | Status: AC
  Administered 2013-06-25: 40 meq via ORAL
  Filled 2013-06-24: qty 2

## 2013-06-24 MED ORDER — SODIUM CHLORIDE 0.9 % IV BOLUS (SEPSIS)
1000.0000 mL | Freq: Once | INTRAVENOUS | Status: AC
  Administered 2013-06-24: 1000 mL via INTRAVENOUS

## 2013-06-24 MED ORDER — ACETAMINOPHEN 325 MG PO TABS
650.0000 mg | ORAL_TABLET | Freq: Once | ORAL | Status: AC
Start: 2013-06-24 — End: 2013-06-24
  Administered 2013-06-24: 650 mg via ORAL
  Filled 2013-06-24: qty 2

## 2013-06-24 NOTE — ED Provider Notes (Signed)
CSN: [id number]     Arrival date & time 06/24/13  2107 History   First MD Initiated Contact with Patient 06/24/13 2137     Chief Complaint  Patient presents with  . Spasms   (Consider location/radiation/quality/duration/timing/severity/associated sxs/prior Treatment) HPI Comments: 58 year old male presents with worsening muscle cramps. He states these cramps have been going on for several years but usually he gets one cramp in one extremity and after rest his pain goes away. He states he would get this once or twice a week these are always self resolving. He states typically he gets them after significant activity. However the past 2-3 weeks she's been getting cramps every couple days they seem to be going in different extremities. Most of the time it seemed to be in his calves but can be in any upper or lower extremity. Is not having weakness but he is unable to stand due to the amount of pain that happens. These have been going away on their own as well but are certainly more intense and more frequent than his other cramps. He states that he has not have any focal weakness or fevers or chills. No headaches, chest pain, shortness of breath, cough or abdominal pain. He states his urine has been darker. No new medicines. He states he does does feel little dehydrated and his lips are dry. He is currently on insulin for his diabetes but is unsure what his glucose has been running. Currently does not feel any of the cramps at this time. He did go on a couple hours prior to arrival it was so severe that he wanted to get it checked out.   Past Medical History  Diagnosis Date  . HTN (hypertension) 09/25/2011  . DM (diabetes mellitus) 09/25/2011  . Degenerative joint disease 09/25/2011  . Depression 09/25/2011  . Anxiety 09/25/2011  . Chronic pain syndrome 09/25/2011  . Basal cell cancer 09/25/2011    Mid back 2007  . Melanoma 09/25/2011    Right thigh 2005   Past Surgical History  Procedure Laterality Date   . Tumor removal from right thigh    . Lymph node removal     Family History  Problem Relation Age of Onset  . Aneurysm Mother   . Cancer Father    History  Substance Use Topics  . Smoking status: Current Every Day Smoker -- 0.50 packs/day    Types: Cigarettes  . Smokeless tobacco: Never Used  . Alcohol Use: Yes     Comment: seldom    Review of Systems  Constitutional: Negative for fever.  HENT: Positive for sneezing.   Respiratory: Negative for cough and shortness of breath.   Cardiovascular: Negative for chest pain.  Gastrointestinal: Positive for diarrhea (1 or 2 loose stools a day over the last couple days). Negative for nausea, vomiting, abdominal pain and abdominal distention.  Genitourinary:       States his urine is darker than normal over 2-3 weeks  Musculoskeletal: Positive for myalgias. Negative for back pain.  Neurological: Negative for weakness.  All other systems reviewed and are negative.    Allergies  Vancomycin  Home Medications   Current Outpatient Rx  Name  Route  Sig  Dispense  Refill  . acetaminophen (TYLENOL) 500 MG tablet   Oral   Take 1,000 mg by mouth every 6 (six) hours as needed for pain.          . citalopram (CELEXA) 20 MG tablet   Oral   Take 1 tablet (  20 mg total) by mouth daily.   30 tablet   0   . fenofibrate 160 MG tablet   Oral   Take 160 mg by mouth daily.         . insulin aspart protamine-insulin aspart (NOVOLOG 70/30) (70-30) 100 UNIT/ML injection   Subcutaneous   Inject 60 Units into the skin 2 (two) times daily with a meal.          . lisinopril (PRINIVIL,ZESTRIL) 10 MG tablet   Oral   Take 10 mg by mouth daily.         . meloxicam (MOBIC) 15 MG tablet   Oral   Take 15 mg by mouth daily.         . QUEtiapine Fumarate (SEROQUEL XR) 150 MG 24 hr tablet   Oral   Take 150 mg by mouth at bedtime.          BP 149/101  Pulse 126  Temp(Src) 99.1 F (37.3 C) (Oral)  Resp 18  SpO2 100% Physical Exam   Nursing note and vitals reviewed. Constitutional: He is oriented to person, place, and time. He appears well-developed and well-nourished.  HENT:  Head: Normocephalic and atraumatic.  Right Ear: External ear normal.  Left Ear: External ear normal.  Nose: Nose normal.  Eyes: Right eye exhibits no discharge. Left eye exhibits no discharge.  Neck: Neck supple.  Cardiovascular: Regular rhythm, normal heart sounds and intact distal pulses.  Tachycardia present.   No murmur heard. Pulmonary/Chest: Effort normal.  Abdominal: Soft. There is no tenderness.  Musculoskeletal: He exhibits no edema.  Neurological: He is alert and oriented to person, place, and time. He has normal strength. No cranial nerve deficit or sensory deficit. He exhibits normal muscle tone. GCS eye subscore is 4. GCS verbal subscore is 5. GCS motor subscore is 6.  5/5 strength in all 4 extremities  Skin: Skin is warm and dry.    ED Course  Procedures (including critical care time) Labs Review Labs Reviewed  CBC WITH DIFFERENTIAL - Abnormal; Notable for the following:    WBC 17.8 (*)    MCHC 36.4 (*)    Neutro Abs 12.2 (*)    Monocytes Absolute 1.4 (*)    All other components within normal limits  BASIC METABOLIC PANEL - Abnormal; Notable for the following:    Sodium 135 (*)    Potassium 3.6 (*)    Glucose, Bld 202 (*)    BUN 31 (*)    All other components within normal limits  HEPATIC FUNCTION PANEL - Abnormal; Notable for the following:    AST 72 (*)    ALT 97 (*)    All other components within normal limits  URINALYSIS, ROUTINE W REFLEX MICROSCOPIC - Abnormal; Notable for the following:    Specific Gravity, Urine 1.038 (*)    Glucose, UA >1000 (*)    All other components within normal limits  GLUCOSE, CAPILLARY - Abnormal; Notable for the following:    Glucose-Capillary 216 (*)    All other components within normal limits  CK  URINE MICROSCOPIC-ADD ON  CG4 I-STAT (LACTIC ACID)   Imaging Review Dg  Chest 2 View  06/24/2013   CLINICAL DATA:  Tachycardia, fever  EXAM: CHEST  2 VIEW  COMPARISON:  10/30/2005  FINDINGS: Lungs are clear. No pleural effusion or pneumothorax.  The heart is normal in size.  Mild degenerative changes of the visualized thoracolumbar spine.  IMPRESSION: No evidence of acute cardiopulmonary disease.  Electronically Signed   By: Julian Hy M.D.   On: 06/24/2013 22:55    EKG Interpretation   None       MDM   1. Muscle cramps   2. Hypokalemia    Patient is well appearing here. Initially tachycardic, found to be mildly febrile rectally to 100.9. Tachycardia resolved, HR in 80s after 1 liter of fluid and tylenol. He felt less dehydrated after this. Workup essentially negative with no obvious source of infection. Has mild hypokalemia at 3.6, given kdur in ED. WBC elevated, but review of chart shows it seems to be chronically elevated in past. History of drub abuse in past, but states no IV drug abuse in over 9 months. No specific joint pain besides his chronic pain, and no signs of joint swelling. I feel endocarditis is unlikely with no murmur and no drug abuse in past several months. Could be a viral syndrome. No cramps or pain while in ED, so hard to assess severity given normal exam. Doubt a psoas abscess or PMR given no proximal weakness. Normal ROM of hips and no abd or back pain. Feel at this time he is stable for discharge and encouraged good fluid intake and need to f/u with PCP as soon as possible for close re-evaluation. Patient verbalized understanding.     Ephraim Hamburger, MD 06/25/13 0030

## 2013-06-24 NOTE — ED Notes (Signed)
Patient sates that he had normal aches and pains on a regular basis. However today he has had mucle cramping that has been full body at time. He complains of cramps to his ghand, legs and feet. The patient is tachycardic.

## 2013-06-25 NOTE — Discharge Instructions (Signed)
It is important to stay well hydrated, be sure to drink extra fluid at home. If your cramps return or worsen, or you develop high fevers or other concerning symptoms you should return to the ER as soon as possible.    Hypokalemia Hypokalemia means that the amount of potassium in the blood is lower than normal.Potassium is a chemical, called an electrolyte, that helps regulate the amount of fluid in the body. It also stimulates muscle contraction and helps nerves function properly.Most of the body's potassium is inside of cells, and only a very small amount is in the blood. Because the amount in the blood is so small, minor changes can be life-threatening. CAUSES  Antibiotics.  Diarrhea or vomiting.  Using laxatives too much, which can cause diarrhea.  Chronic kidney disease.  Water pills (diuretics).  Eating disorders (bulimia).  Low magnesium level.  Sweating a lot. SIGNS AND SYMPTOMS  Weakness.  Constipation.  Fatigue.  Muscle cramps.  Mental confusion.  Skipped heartbeats or irregular heartbeat (palpitations).  Tingling or numbness. DIAGNOSIS  Your health care provider can diagnose hypokalemia with blood tests. In addition to checking your potassium level, your health care provider may also check other lab tests. TREATMENT Hypokalemia can be treated with potassium supplements taken by mouth or adjustments in your current medicines. If your potassium level is very low, you may need to get potassium through a vein (IV) and be monitored in the hospital. A diet high in potassium is also helpful. Foods high in potassium are:  Nuts, such as peanuts and pistachios.  Seeds, such as sunflower seeds and pumpkin seeds.  Peas, lentils, and lima beans.  Whole grain and bran cereals and breads.  Fresh fruit and vegetables, such as apricots, avocado, bananas, cantaloupe, kiwi, oranges, tomatoes, asparagus, and potatoes.  Orange and tomato juices.  Red meats.  Fruit  yogurt. HOME CARE INSTRUCTIONS  Take all medicines as prescribed by your health care provider.  Maintain a healthy diet by including nutritious food, such as fruits, vegetables, nuts, whole grains, and lean meats.  If you are taking a laxative, be sure to follow the directions on the label. SEEK MEDICAL CARE IF:  Your weakness gets worse.  You feel your heart pounding or racing.  You are vomiting or having diarrhea.  You are diabetic and having trouble keeping your blood glucose in the normal range. SEEK IMMEDIATE MEDICAL CARE IF:  You have chest pain, shortness of breath, or dizziness.  You are vomiting or having diarrhea for more than 2 days.  You faint. MAKE SURE YOU:   Understand these instructions.  Will watch your condition.  Will get help right away if you are not doing well or get worse. Document Released: 05/25/2005 Document Revised: 03/15/2013 Document Reviewed: 11/25/2012 [facility] Patient Information 2014 [employer].

## 2013-06-25 NOTE — ED Notes (Signed)
Patient is alert and oriented x3.  He was given DC instructions and follow up visit instructions.  Patient gave verbal understanding.  He was DC ambulatory under his own power to home.  V/S stable.  He was not showing any signs of distress on DC 

## 2013-06-26 ENCOUNTER — Encounter (HOSPITAL_COMMUNITY): Payer: Self-pay | Admitting: Emergency Medicine

## 2013-06-26 ENCOUNTER — Inpatient Hospital Stay (HOSPITAL_COMMUNITY)
Admission: EM | Admit: 2013-06-26 | Discharge: 2013-07-07 | DRG: 982 | Disposition: A | Discharge status: Home Health | Payer: Medicare Other | Attending: Internal Medicine | Admitting: Internal Medicine

## 2013-06-26 ENCOUNTER — Emergency Department (HOSPITAL_COMMUNITY): Payer: Medicare Other

## 2013-06-26 DIAGNOSIS — L03319 Cellulitis of trunk, unspecified: Principal | ICD-10-CM

## 2013-06-26 DIAGNOSIS — Z794 Long term (current) use of insulin: Secondary | ICD-10-CM

## 2013-06-26 DIAGNOSIS — E162 Hypoglycemia, unspecified: Secondary | ICD-10-CM | POA: Diagnosis not present

## 2013-06-26 DIAGNOSIS — F32A Depression, unspecified: Secondary | ICD-10-CM

## 2013-06-26 DIAGNOSIS — Z6831 Body mass index (BMI) 31.0-31.9, adult: Secondary | ICD-10-CM

## 2013-06-26 DIAGNOSIS — F411 Generalized anxiety disorder: Secondary | ICD-10-CM | POA: Diagnosis present

## 2013-06-26 DIAGNOSIS — N451 Epididymitis: Secondary | ICD-10-CM | POA: Diagnosis present

## 2013-06-26 DIAGNOSIS — N498 Inflammatory disorders of other specified male genital organs: Secondary | ICD-10-CM | POA: Diagnosis present

## 2013-06-26 DIAGNOSIS — N4889 Other specified disorders of penis: Secondary | ICD-10-CM | POA: Diagnosis present

## 2013-06-26 DIAGNOSIS — M79609 Pain in unspecified limb: Secondary | ICD-10-CM | POA: Diagnosis present

## 2013-06-26 DIAGNOSIS — E118 Type 2 diabetes mellitus with unspecified complications: Secondary | ICD-10-CM

## 2013-06-26 DIAGNOSIS — B951 Streptococcus, group B, as the cause of diseases classified elsewhere: Secondary | ICD-10-CM | POA: Diagnosis present

## 2013-06-26 DIAGNOSIS — E1169 Type 2 diabetes mellitus with other specified complication: Secondary | ICD-10-CM

## 2013-06-26 DIAGNOSIS — G894 Chronic pain syndrome: Secondary | ICD-10-CM | POA: Diagnosis present

## 2013-06-26 DIAGNOSIS — L02414 Cutaneous abscess of left upper limb: Secondary | ICD-10-CM

## 2013-06-26 DIAGNOSIS — N433 Hydrocele, unspecified: Secondary | ICD-10-CM | POA: Diagnosis present

## 2013-06-26 DIAGNOSIS — L02219 Cutaneous abscess of trunk, unspecified: Principal | ICD-10-CM | POA: Diagnosis present

## 2013-06-26 DIAGNOSIS — Z85828 Personal history of other malignant neoplasm of skin: Secondary | ICD-10-CM

## 2013-06-26 DIAGNOSIS — N453 Epididymo-orchitis: Secondary | ICD-10-CM | POA: Diagnosis present

## 2013-06-26 DIAGNOSIS — F3289 Other specified depressive episodes: Secondary | ICD-10-CM | POA: Diagnosis present

## 2013-06-26 DIAGNOSIS — N493 Fournier gangrene: Secondary | ICD-10-CM

## 2013-06-26 DIAGNOSIS — E1165 Type 2 diabetes mellitus with hyperglycemia: Secondary | ICD-10-CM

## 2013-06-26 DIAGNOSIS — R739 Hyperglycemia, unspecified: Secondary | ICD-10-CM

## 2013-06-26 DIAGNOSIS — L0390 Cellulitis, unspecified: Secondary | ICD-10-CM

## 2013-06-26 DIAGNOSIS — N501 Vascular disorders of male genital organs: Secondary | ICD-10-CM | POA: Diagnosis present

## 2013-06-26 DIAGNOSIS — L0291 Cutaneous abscess, unspecified: Secondary | ICD-10-CM

## 2013-06-26 DIAGNOSIS — Z881 Allergy status to other antibiotic agents status: Secondary | ICD-10-CM

## 2013-06-26 DIAGNOSIS — B1920 Unspecified viral hepatitis C without hepatic coma: Secondary | ICD-10-CM | POA: Diagnosis present

## 2013-06-26 DIAGNOSIS — E46 Unspecified protein-calorie malnutrition: Secondary | ICD-10-CM | POA: Diagnosis present

## 2013-06-26 DIAGNOSIS — F329 Major depressive disorder, single episode, unspecified: Secondary | ICD-10-CM | POA: Diagnosis present

## 2013-06-26 DIAGNOSIS — F1920 Other psychoactive substance dependence, uncomplicated: Secondary | ICD-10-CM | POA: Diagnosis present

## 2013-06-26 DIAGNOSIS — M1990 Unspecified osteoarthritis, unspecified site: Secondary | ICD-10-CM | POA: Diagnosis present

## 2013-06-26 DIAGNOSIS — F17200 Nicotine dependence, unspecified, uncomplicated: Secondary | ICD-10-CM | POA: Diagnosis present

## 2013-06-26 DIAGNOSIS — Z79899 Other long term (current) drug therapy: Secondary | ICD-10-CM

## 2013-06-26 DIAGNOSIS — E785 Hyperlipidemia, unspecified: Secondary | ICD-10-CM | POA: Diagnosis present

## 2013-06-26 DIAGNOSIS — IMO0002 Reserved for concepts with insufficient information to code with codable children: Secondary | ICD-10-CM | POA: Diagnosis present

## 2013-06-26 DIAGNOSIS — E871 Hypo-osmolality and hyponatremia: Secondary | ICD-10-CM | POA: Diagnosis present

## 2013-06-26 DIAGNOSIS — F419 Anxiety disorder, unspecified: Secondary | ICD-10-CM

## 2013-06-26 DIAGNOSIS — Z85820 Personal history of malignant melanoma of skin: Secondary | ICD-10-CM

## 2013-06-26 DIAGNOSIS — I10 Essential (primary) hypertension: Secondary | ICD-10-CM | POA: Diagnosis present

## 2013-06-26 DIAGNOSIS — L03314 Cellulitis of groin: Secondary | ICD-10-CM

## 2013-06-26 LAB — URINALYSIS, ROUTINE W REFLEX MICROSCOPIC
BILIRUBIN URINE: NEGATIVE
Glucose, UA: >1000 mg/dL — AB
Hgb urine dipstick: NEGATIVE
Ketones, ur: NEGATIVE mg/dL
LEUKOCYTES UA: NEGATIVE
NITRITE: NEGATIVE
PH: 5 (ref 5.0–8.0)
Protein, ur: NEGATIVE mg/dL
SPECIFIC GRAVITY, URINE: 1.038 — AB (ref 1.005–1.030)
Urobilinogen, UA: 0.2 mg/dL (ref 0.0–1.0)

## 2013-06-26 LAB — CBC
HEMATOCRIT: 43.5 % (ref 39.0–52.0)
Hemoglobin: 15.2 g/dL (ref 13.0–17.0)
MCH: 31.1 pg (ref 26.0–34.0)
MCHC: 34.9 g/dL (ref 30.0–36.0)
MCV: 89.1 fL (ref 78.0–100.0)
Platelets: 260 10*3/uL (ref 150–400)
RBC: 4.88 MIL/uL (ref 4.22–5.81)
RDW: 12.4 % (ref 11.5–15.5)
WBC: 23 10*3/uL — AB (ref 4.0–10.5)

## 2013-06-26 LAB — BASIC METABOLIC PANEL
BUN: 26 mg/dL — AB (ref 6–23)
CHLORIDE: 93 meq/L — AB (ref 96–112)
CO2: 21 mEq/L (ref 19–32)
Calcium: 9.2 mg/dL (ref 8.4–10.5)
Creatinine, Ser: 0.96 mg/dL (ref 0.50–1.35)
GFR calc non Af Amer: >90 mL/min (ref >90)
Glucose, Bld: 526 mg/dL — ABNORMAL HIGH (ref 70–99)
POTASSIUM: 5.2 meq/L (ref 3.7–5.3)
SODIUM: 129 meq/L — AB (ref 137–147)

## 2013-06-26 LAB — GLUCOSE, CAPILLARY
GLUCOSE-CAPILLARY: 387 mg/dL — AB (ref 70–99)
Glucose-Capillary: 447 mg/dL — ABNORMAL HIGH (ref 70–99)

## 2013-06-26 LAB — URINE MICROSCOPIC-ADD ON

## 2013-06-26 MED ORDER — DOXYCYCLINE HYCLATE 100 MG PO TABS
100.0000 mg | ORAL_TABLET | Freq: Two times a day (BID) | ORAL | Status: DC
  Administered 2013-06-27 – 2013-06-28 (×4): 100 mg via ORAL
  Filled 2013-06-26 (×5): qty 1

## 2013-06-26 MED ORDER — SODIUM CHLORIDE 0.9 % IV SOLN
INTRAVENOUS | Status: DC
  Administered 2013-06-26 – 2013-06-28 (×2): via INTRAVENOUS

## 2013-06-26 MED ORDER — FENOFIBRATE 160 MG PO TABS
160.0000 mg | ORAL_TABLET | Freq: Every day | ORAL | Status: DC
  Administered 2013-06-27 – 2013-07-07 (×10): 160 mg via ORAL
  Filled 2013-06-26 (×11): qty 1

## 2013-06-26 MED ORDER — SODIUM CHLORIDE 0.9 % IV SOLN
Freq: Once | INTRAVENOUS | Status: AC
  Administered 2013-06-26: 15:00:00 via INTRAVENOUS

## 2013-06-26 MED ORDER — INSULIN ASPART PROT & ASPART (70-30 MIX) 100 UNIT/ML ~~LOC~~ SUSP
80.0000 [IU] | Freq: Two times a day (BID) | SUBCUTANEOUS | Status: DC
  Filled 2013-06-26: qty 10

## 2013-06-26 MED ORDER — SODIUM CHLORIDE 0.9 % IV BOLUS (SEPSIS)
1000.0000 mL | Freq: Once | INTRAVENOUS | Status: AC
  Administered 2013-06-26: 1000 mL via INTRAVENOUS

## 2013-06-26 MED ORDER — ONDANSETRON HCL 4 MG/2ML IJ SOLN: 4.0000 mg | Freq: Four times a day (QID) | INTRAMUSCULAR | Status: DC | PRN

## 2013-06-26 MED ORDER — LISINOPRIL 10 MG PO TABS
10.0000 mg | ORAL_TABLET | Freq: Every day | ORAL | Status: DC
  Administered 2013-06-27 – 2013-07-07 (×9): 10 mg via ORAL
  Filled 2013-06-26 (×11): qty 1

## 2013-06-26 MED ORDER — ENOXAPARIN SODIUM 40 MG/0.4ML ~~LOC~~ SOLN
40.0000 mg | SUBCUTANEOUS | Status: DC
  Administered 2013-06-27 – 2013-07-07 (×10): 40 mg via SUBCUTANEOUS
  Filled 2013-06-26 (×11): qty 0.4

## 2013-06-26 MED ORDER — HYDROMORPHONE HCL PF 1 MG/ML IJ SOLN
1.0000 mg | INTRAMUSCULAR | Status: DC | PRN
  Administered 2013-06-26 – 2013-06-27 (×4): 1 mg via INTRAVENOUS
  Filled 2013-06-26 (×4): qty 1

## 2013-06-26 MED ORDER — INSULIN ASPART 100 UNIT/ML ~~LOC~~ SOLN: 0.0000 [IU] | Freq: Three times a day (TID) | SUBCUTANEOUS | Status: DC

## 2013-06-26 MED ORDER — ACETAMINOPHEN 325 MG PO TABS
650.0000 mg | ORAL_TABLET | Freq: Four times a day (QID) | ORAL | Status: DC | PRN
  Administered 2013-07-02: 650 mg via ORAL
  Filled 2013-06-26: qty 2

## 2013-06-26 MED ORDER — CITALOPRAM HYDROBROMIDE 20 MG PO TABS
20.0000 mg | ORAL_TABLET | Freq: Every day | ORAL | Status: DC
  Administered 2013-06-27 – 2013-06-29 (×3): 20 mg via ORAL
  Filled 2013-06-26 (×4): qty 1

## 2013-06-26 MED ORDER — CLINDAMYCIN PHOSPHATE 600 MG/50ML IV SOLN
600.0000 mg | Freq: Once | INTRAVENOUS | Status: AC
  Administered 2013-06-26: 600 mg via INTRAVENOUS
  Filled 2013-06-26 (×2): qty 50

## 2013-06-26 MED ORDER — CLINDAMYCIN PHOSPHATE 300 MG/50ML IV SOLN
300.0000 mg | Freq: Three times a day (TID) | INTRAVENOUS | Status: DC
  Administered 2013-06-26 – 2013-06-27 (×2): 300 mg via INTRAVENOUS
  Filled 2013-06-26 (×4): qty 50

## 2013-06-26 MED ORDER — ACETAMINOPHEN 325 MG PO TABS: 650.0000 mg | ORAL_TABLET | Freq: Four times a day (QID) | ORAL | Status: DC | PRN

## 2013-06-26 MED ORDER — HYDROMORPHONE HCL PF 1 MG/ML IJ SOLN
1.0000 mg | Freq: Once | INTRAMUSCULAR | Status: AC
  Administered 2013-06-26: 1 mg via INTRAVENOUS
  Filled 2013-06-26: qty 1

## 2013-06-26 MED ORDER — CEFTRIAXONE SODIUM 1 G IJ SOLR
1.0000 g | Freq: Once | INTRAMUSCULAR | Status: AC
Start: 2013-06-26 — End: 2013-06-26
  Administered 2013-06-26: 1 g via INTRAVENOUS
  Filled 2013-06-26: qty 10

## 2013-06-26 MED ORDER — QUETIAPINE FUMARATE ER 50 MG PO TB24
150.0000 mg | ORAL_TABLET | Freq: Every day | ORAL | Status: DC
  Administered 2013-06-27 – 2013-07-06 (×10): 150 mg via ORAL
  Filled 2013-06-26 (×13): qty 3

## 2013-06-26 NOTE — H&P (Addendum)
[facility] History and Physical  Charles Manning QQV:[id number] DOB: [birth year] DOA: 06/26/2013  Referring physician: EDP PCP: Benito Mccreedy, MD   Chief Complaint: redness in groin  HPI: Charles Manning is a 58 y.o. male with PMH of uncontrolled DM, chronic pain, HTN, depression, h/o melanoma resection from R groin in 2005, and chronic R leg lymphedema presents to the ER today with the above complaints. He noticed pain and redness in the R groin 2 days ago, the pain and redness extended down to his scrotum. He reports subjective fevers and chills. In ER, noted to have hyperglycemia, leukocytosis and [place] with R epididymitis vs cellulitis.    Review of Systems:  Constitutional:  No weight loss, night sweats, Fevers, chills, fatigue.  HEENT:  No headaches, Difficulty swallowing,Tooth/dental problems,Sore throat,  No sneezing, itching, ear ache, nasal congestion, post nasal drip,  Cardio-vascular:  No chest pain, Orthopnea, PND, swelling in lower extremities, anasarca, dizziness, palpitations  GI:  No heartburn, indigestion, abdominal pain, nausea, vomiting, diarrhea, change in bowel habits, loss of appetite  Resp:  No shortness of breath with exertion or at rest. No excess mucus, no productive cough, No non-productive cough, No coughing up of blood.No change in color of mucus.No wheezing.No chest wall deformity  Skin:  no rash or lesions.  GU:  no dysuria, change in color of urine, no urgency or frequency. No flank pain.  Musculoskeletal:  No joint pain or swelling. No decreased range of motion. No back pain.  Psych:  No change in mood or affect. No depression or anxiety. No memory loss.   Past Medical History  Diagnosis Date  . HTN (hypertension) 09/25/2011  . DM (diabetes mellitus) 09/25/2011  . Degenerative joint disease 09/25/2011  . Depression 09/25/2011  . Anxiety 09/25/2011  . Chronic pain syndrome 09/25/2011  . Basal cell cancer 09/25/2011    Mid back 2007  .  Melanoma 09/25/2011    Right thigh 2005   Past Surgical History  Procedure Laterality Date  . Tumor removal from right thigh    . Lymph node removal     Social History:  reports that he has been smoking Cigarettes.  He has been smoking about 0.50 packs per day. He has never used smokeless tobacco. He reports that he drinks alcohol. He reports that he does not use illicit drugs.  Allergies  Allergen Reactions  . Vancomycin Itching and Rash    Family History  Problem Relation Age of Onset  . Aneurysm Mother   . Cancer Father      Prior to Admission medications   Medication Sig Start Date End Date Taking? Authorizing Provider  acetaminophen (TYLENOL) 500 MG tablet Take 1,000 mg by mouth every 6 (six) hours as needed for pain.    Yes Historical Provider, MD  citalopram (CELEXA) 20 MG tablet Take 1 tablet (20 mg total) by mouth daily. 01/20/12  Yes Barton Dubois, MD  fenofibrate 160 MG tablet Take 160 mg by mouth daily.   Yes Historical Provider, MD  insulin aspart protamine-insulin aspart (NOVOLOG 70/30) (70-30) 100 UNIT/ML injection Inject 60 Units into the skin 2 (two) times daily with a meal.  03/31/12  Yes Eugenie Filler, MD  lisinopril (PRINIVIL,ZESTRIL) 10 MG tablet Take 10 mg by mouth daily.   Yes Historical Provider, MD  meloxicam (MOBIC) 15 MG tablet Take 15 mg by mouth daily.   Yes Historical Provider, MD  QUEtiapine Fumarate (SEROQUEL XR) 150 MG 24 hr tablet Take 150 mg by mouth at  bedtime.   Yes Historical Provider, MD   Physical Exam: Filed Vitals:   06/26/13 1702  BP: 137/66  Pulse: 87  Temp: 98.1 F (36.7 C)  Resp:     BP 137/66  Pulse 87  Temp(Src) 98.1 F (36.7 C) (Oral)  Resp 18  SpO2 97%  General:  Appears calm and comfortable Eyes: PERRL, normal lids, irises & conjunctiva ENT: grossly normal hearing, lips & tongue Neck: no LAD, masses or thyromegaly Cardiovascular: RRR, no m/r/g. No LE edema. Telemetry: SR, no arrhythmias  Respiratory: CTA  bilaterally, no w/r/r. Normal respiratory effort. Abdomen: soft, NT, BS present R groin: scar in R groin, redness and swelling of R vas deferens extending to scrotum, wih redness and tenderness of scrotal skin Skin: no rash or induration seen on limited exam Musculoskeletal:  chronic RLE lymphedema Psychiatric: grossly normal mood and affect, speech fluent and appropriate Neurologic: grossly non-focal.          Labs on Admission:  Basic Metabolic Panel:  Recent Labs Lab 06/24/13 2223 06/26/13 1445  NA 135* 129*  K 3.6* 5.2  CL 98 93*  CO2 23 21  GLUCOSE 202* 526*  BUN 31* 26*  CREATININE 0.89 0.96  CALCIUM 9.3 9.2   Liver Function Tests:  Recent Labs Lab 06/24/13 2223  AST 72*  ALT 97*  ALKPHOS 81  BILITOT 0.5  PROT 7.0  ALBUMIN 3.5   No results found for this basename: LIPASE, AMYLASE,  in the last 168 hours No results found for this basename: AMMONIA,  in the last 168 hours CBC:  Recent Labs Lab 06/24/13 2223 06/26/13 1445  WBC 17.8* 23.0*  NEUTROABS 12.2*  --   HGB 15.8 15.2  HCT 43.4 43.5  MCV 86.1 89.1  PLT 281 260   Cardiac Enzymes:  Recent Labs Lab 06/24/13 2223  CKTOTAL 109    BNP (last 3 results) No results found for this basename: PROBNP,  in the last 8760 hours CBG:  Recent Labs Lab 06/24/13 2200 06/26/13 1648  GLUCAP 216* 387*    Radiological Exams on Admission: Dg Chest 2 View  06/24/2013   CLINICAL DATA:  Tachycardia, fever  EXAM: CHEST  2 VIEW  COMPARISON:  10/30/2005  FINDINGS: Lungs are clear. No pleural effusion or pneumothorax.  The heart is normal in size.  Mild degenerative changes of the visualized thoracolumbar spine.  IMPRESSION: No evidence of acute cardiopulmonary disease.   Electronically Signed   By: Julian Hy M.D.   On: 06/24/2013 22:55   US Scrotum  06/26/2013   CLINICAL DATA:  Right scrotal swelling, redness and pain.  EXAM: SCROTAL ULTRASOUND  DOPPLER ULTRASOUND OF THE TESTICLES  TECHNIQUE: Complete  ultrasound examination of the testicles, epididymis, and other scrotal structures was performed. Color and spectral Doppler ultrasound were also utilized to evaluate blood flow to the testicles.  COMPARISON:  None.  FINDINGS: Right testicle  Measurements: 4 cm x 2.1 cm x 4.7 cm. No mass or microlithiasis visualized.  Left testicle  Measurements: 4.7 cm x 2.6 cm x 3.3 cm. No mass or microlithiasis visualized.  Right epididymis: Mildly enlarged with increased vascularity mostly to the tail. No mass.  Left epididymis: Normal in size in echogenicity. No increased blood flow.  Hydrocele:  None visualized.  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  Pulsed Doppler interrogation of both testes demonstrates there is right scrotal skin thickening with hyperemia surrounding the right testicle within the deep layers of the scrotum.  IMPRESSION: 1.  Findings support right epididymitis as well as edema or possibly cellulitis of the right scrotum. No hydroceles. 2. 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 3. Normal testicles.  No testicular mass or torsion   Electronically Signed   By: Lajean Manes M.D.   On: 06/26/2013 16:30   [place] Art/ven Flow Abd Pelv Doppler  06/26/2013   CLINICAL DATA:  Right scrotal swelling, redness and pain.  EXAM: SCROTAL ULTRASOUND  DOPPLER ULTRASOUND OF THE TESTICLES  TECHNIQUE: Complete ultrasound examination of the testicles, epididymis, and other scrotal structures was performed. Color and spectral Doppler ultrasound were also utilized to evaluate blood flow to the testicles.  COMPARISON:  None.  FINDINGS: Right testicle  Measurements: 4 cm x 2.1 cm x 4.7 cm. No mass or microlithiasis visualized.  Left testicle  Measurements: 4.7 cm x 2.6 cm x 3.3 cm. No mass or microlithiasis visualized.  Right epididymis: Mildly enlarged with increased vascularity mostly to the tail. No mass.  Left epididymis: Normal in size in echogenicity. No increased blood flow.  Hydrocele:  None visualized.   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  Pulsed Doppler interrogation of both testes demonstrates there is right scrotal skin thickening with hyperemia surrounding the right testicle within the deep layers of the scrotum.  IMPRESSION: 1. Findings support right epididymitis as well as edema or possibly cellulitis of the right scrotum. No hydroceles. 2. 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 3. Normal testicles.  No testicular mass or torsion   Electronically Signed   By: Lajean Manes M.D.   On: 06/26/2013 16:30    Assessment/Plan  1.  Cellulitis of R groin/scrotum vs epididymitis   -start IV Clindamycin -will give 1 dose ceftriaxone and also Po doxy due to possibility of gonococcal or chlamydia epididymitis -i d/w Dr.Eskridge who will see pt in am-Thank you -Scrotal USG noted  2. Uncontrolled DM -CBG now 387, improved from 526 -increase insulin 70/30, SSI -check hbaic  3. Chronic pain  4. Depression -continue celexa and seroquel  5. Hyponatremia -due to hyperglycemia and possibly volume depletion -hydrate, treat hyperglycemia and monitor  DVT proph: lovenox  Code Status: Full Code Family Communication: none at bedside Disposition Plan: inpatient  Time spent: 50min  Tiara Maultsby [facility] Pager [phone]

## 2013-06-26 NOTE — Progress Notes (Signed)
   CARE MANAGEMENT ED NOTE 06/26/2013  Patient:  Charles Manning, Charles Manning   Account Number:  [id number]  Date Initiated:  06/26/2013  Documentation initiated by:  Livia Snellen  Subjective/Objective Assessment:   HPI: KENARD MORAWSKI is a 58 y.o. male with PMH of uncontrolled DM, chronic pain, HTN, depression, h/o melanoma resection from R groin in 2005, and chronic R leg lymphedema     Subjective/Objective Assessment Detail:   Patient with redness to groin area.     Action/Plan:   Patient given IV ABX, IV NSS bolus and IV pain medication in the ED.   Action/Plan Detail:   Patient to be admitted.   Anticipated DC Date:       Status Recommendation to Physician:   Result of Recommendation:    Other ED Services  Consult Working [place]  Other    Choice offered to / List presented to:            Status of service:  Completed, signed off  ED Comments:   ED Comments Detail:  [facility] consulted to see patient regarding financial issues. EDCM spoke to patient at bedside.  Patient reports he makes 850 dollars a month with his diability.  Patient reports he has difficulty paying his bills.  EDCM provided patient with a list of financial reosurces in the community sucha as local churches and [employer] and [employer]. Patient eports he was planning on going to [employer] but then came into the hospital.  Patient thankful for resources.  No further EDCM needs at this time.

## 2013-06-26 NOTE — ED Notes (Signed)
Pt has cellulitis, rash to groin area with nausea

## 2013-06-26 NOTE — ED Provider Notes (Signed)
CSN: [id number]     Arrival date & time 06/26/13  1214 History   First MD Initiated Contact with Patient 06/26/13 1408     Chief Complaint  Patient presents with  . Groin Pain  . Nausea   (Consider location/radiation/quality/duration/timing/severity/associated sxs/prior Treatment) HPI Pt presenting with c/o pain and redness in groin area.  He has hx of testicular? Cancer and is s/p dissection of scrotal region in 2005 per his report. He states last night he began to have pain and felt swelling in area of scar.  Redness and pain with swelling has gradually increased to involve right inguinal area and remainder of scrotum.  He has also had some nausea.  No fever/chills, no vomiting. He was seen at his doctor's office this morning and advised to come to the ED for IV abx.  Palpation makes pain worse.  Symptoms have been constant and worsening.  There are no other associated systemic symptoms, there are no other alleviating or modifying factors.   Past Medical History  Diagnosis Date  . HTN (hypertension) 09/25/2011  . DM (diabetes mellitus) 09/25/2011  . Degenerative joint disease 09/25/2011  . Depression 09/25/2011  . Anxiety 09/25/2011  . Chronic pain syndrome 09/25/2011  . Basal cell cancer 09/25/2011    Mid back 2007  . Melanoma 09/25/2011    Right thigh 2005   Past Surgical History  Procedure Laterality Date  . Tumor removal from right thigh    . Lymph node removal     Family History  Problem Relation Age of Onset  . Aneurysm Mother   . Cancer Father    History  Substance Use Topics  . Smoking status: Current Every Day Smoker -- 0.50 packs/day    Types: Cigarettes  . Smokeless tobacco: Never Used  . Alcohol Use: Yes     Comment: seldom    Review of Systems ROS reviewed and all otherwise negative except for mentioned in HPI  Allergies  Vancomycin  Home Medications   No current outpatient prescriptions on file. BP 112/74  Pulse 78  Temp(Src) 97.7 F (36.5 C) (Oral)   Resp 18  Ht 5\' 9"  (1.753 m)  Wt 194 lb 10.7 oz (88.3 kg)  BMI 28.73 kg/m2  SpO2 96% Vitals reviewed Physical Exam Physical Examination: General appearance - alert, well appearing, and in no distress Mental status - alert, oriented to person, place, and time Eyes - no conjunctival injection, no scleral icterus Mouth - mucous membranes moist, pharynx normal without lesions Chest - clear to auscultation, no wheezes, rales or rhonchi, symmetric air entry Heart - normal rate, regular rhythm, normal S1, S2, no murmurs, rubs, clicks or gallops Abdomen - soft, nontender, nondistended, no masses or organomegaly GU Male - no penile lesions or discharge,right lateral scrotal skin and right inguinal area with erythema, induration and firmness, warm to touch, no testicular mass palpable but tenderness over testicle as well as scrotal skin, area of remote incision in right inguinal region is swollen and erythematous Extremities - peripheral pulses normal, no pedal edema, no clubbing or cyanosis Skin - normal coloration and turgor, no rashes  ED Course  Procedures (including critical care time) Labs Review Labs Reviewed  CBC - Abnormal; Notable for the following:    WBC 23.0 (*)    All other components within normal limits  BASIC METABOLIC PANEL - Abnormal; Notable for the following:    Sodium 129 (*)    Chloride 93 (*)    Glucose, Bld 526 (*)  BUN 26 (*)    All other components within normal limits  URINALYSIS, ROUTINE W REFLEX MICROSCOPIC - Abnormal; Notable for the following:    Specific Gravity, Urine 1.038 (*)    Glucose, UA >1000 (*)    All other components within normal limits  GLUCOSE, CAPILLARY - Abnormal; Notable for the following:    Glucose-Capillary 387 (*)    All other components within normal limits  GLUCOSE, CAPILLARY - Abnormal; Notable for the following:    Glucose-Capillary >600 (*)    All other components within normal limits  GLUCOSE, CAPILLARY - Abnormal; Notable for  the following:    Glucose-Capillary 447 (*)    All other components within normal limits  BASIC METABOLIC PANEL - Abnormal; Notable for the following:    Sodium 127 (*)    Chloride 92 (*)    Glucose, Bld 392 (*)    All other components within normal limits  HEMOGLOBIN A1C - Abnormal; Notable for the following:    Hemoglobin A1C 14.7 (*)    Mean Plasma Glucose 375 (*)    All other components within normal limits  CBC - Abnormal; Notable for the following:    WBC 22.3 (*)    HCT 37.3 (*)    All other components within normal limits  GLUCOSE, CAPILLARY - Abnormal; Notable for the following:    Glucose-Capillary 365 (*)    All other components within normal limits  GLUCOSE, CAPILLARY - Abnormal; Notable for the following:    Glucose-Capillary 378 (*)    All other components within normal limits  BASIC METABOLIC PANEL - Abnormal; Notable for the following:    Sodium 128 (*)    Glucose, Bld 282 (*)    Calcium 7.8 (*)    All other components within normal limits  GLUCOSE, CAPILLARY - Abnormal; Notable for the following:    Glucose-Capillary 358 (*)    All other components within normal limits  GLUCOSE, CAPILLARY - Abnormal; Notable for the following:    Glucose-Capillary 263 (*)    All other components within normal limits  GLUCOSE, CAPILLARY - Abnormal; Notable for the following:    Glucose-Capillary 217 (*)    All other components within normal limits  GLUCOSE, CAPILLARY - Abnormal; Notable for the following:    Glucose-Capillary 232 (*)    All other components within normal limits  GLUCOSE, CAPILLARY - Abnormal; Notable for the following:    Glucose-Capillary 142 (*)    All other components within normal limits  GLUCOSE, CAPILLARY - Abnormal; Notable for the following:    Glucose-Capillary 148 (*)    All other components within normal limits  GLUCOSE, CAPILLARY - Abnormal; Notable for the following:    Glucose-Capillary 249 (*)    All other components within normal limits   GLUCOSE, CAPILLARY - Abnormal; Notable for the following:    Glucose-Capillary 299 (*)    All other components within normal limits  GC/CHLAMYDIA PROBE AMP  URINE MICROSCOPIC-ADD ON  CBC WITH DIFFERENTIAL  BASIC METABOLIC PANEL   Imaging Review US Scrotum  06/26/2013   CLINICAL DATA:  Right scrotal swelling, redness and pain.  EXAM: SCROTAL ULTRASOUND  DOPPLER ULTRASOUND OF THE TESTICLES  TECHNIQUE: Complete ultrasound examination of the testicles, epididymis, and other scrotal structures was performed. Color and spectral Doppler ultrasound were also utilized to evaluate blood flow to the testicles.  COMPARISON:  None.  FINDINGS: Right testicle  Measurements: 4 cm x 2.1 cm x 4.7 cm. No mass or microlithiasis visualized.  Left  testicle  Measurements: 4.7 cm x 2.6 cm x 3.3 cm. No mass or microlithiasis visualized.  Right epididymis: Mildly enlarged with increased vascularity mostly to the tail. No mass.  Left epididymis: Normal in size in echogenicity. No increased blood flow.  Hydrocele:  None visualized.  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  Pulsed Doppler interrogation of both testes demonstrates there is right scrotal skin thickening with hyperemia surrounding the right testicle within the deep layers of the scrotum.  IMPRESSION: 1. Findings support right epididymitis as well as edema or possibly cellulitis of the right scrotum. No hydroceles. 2. 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 3. Normal testicles.  No testicular mass or torsion   Electronically Signed   By: Lajean Manes M.D.   On: 06/26/2013 16:30   [place] Art/ven Flow Abd Pelv Doppler  06/26/2013   CLINICAL DATA:  Right scrotal swelling, redness and pain.  EXAM: SCROTAL ULTRASOUND  DOPPLER ULTRASOUND OF THE TESTICLES  TECHNIQUE: Complete ultrasound examination of the testicles, epididymis, and other scrotal structures was performed. Color and spectral Doppler ultrasound were also utilized to evaluate blood flow to the  testicles.  COMPARISON:  None.  FINDINGS: Right testicle  Measurements: 4 cm x 2.1 cm x 4.7 cm. No mass or microlithiasis visualized.  Left testicle  Measurements: 4.7 cm x 2.6 cm x 3.3 cm. No mass or microlithiasis visualized.  Right epididymis: Mildly enlarged with increased vascularity mostly to the tail. No mass.  Left epididymis: Normal in size in echogenicity. No increased blood flow.  Hydrocele:  None visualized.  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  Pulsed Doppler interrogation of both testes demonstrates there is right scrotal skin thickening with hyperemia surrounding the right testicle within the deep layers of the scrotum.  IMPRESSION: 1. Findings support right epididymitis as well as edema or possibly cellulitis of the right scrotum. No hydroceles. 2. 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 3. Normal testicles.  No testicular mass or torsion   Electronically Signed   By: Lajean Manes M.D.   On: 06/26/2013 16:30    EKG Interpretation    Date/Time:    Ventricular Rate:    PR Interval:    QRS Duration:   QT Interval:    QTC Calculation:   R Axis:     Text Interpretation:              MDM   1. Cellulitis of groin, right   2. Hyperglycemia   3. Anxiety   4. Chronic pain syndrome   5. Diabetes mellitus type 2 with complications, uncontrolled   6. Epididymitis    Pt presenting with c/o pain and redness of scrotal/inguinal region on right.  Pt has leukocytosis and started on clindamycin.  Scrotal ultrasound pending as well as remainder of labs.  Pt signed out to oncoming provider pending ultrasound and labs.  Pt will likely need admission to medical service for IV abx if ultrasound is reassuring.      Threasa Beards, MD 06/27/13 [phone]

## 2013-06-26 NOTE — ED Notes (Signed)
Pt resting on stretcher, inpt MD at bs for eval at this time.  Pt med per [facility].  Given ice water per request with inpt MD verbal ok.  Tolerating well at this time.  Denies further needs/complaints.  Nad.

## 2013-06-26 NOTE — ED Notes (Signed)
Gave Patient a Urinal

## 2013-06-26 NOTE — ED Notes (Signed)
Pt was given dinner. 

## 2013-06-26 NOTE — Consult Note (Signed)
Consult: scrotal edema Requested by: Dr. Broadus John  History of Present Illness: Two days ago he went to ER with tachycardiac, myalgias, found to be mildly febrile rectally to 100.9, abd wbc = 17.8. Last night he developed right scrotal pain and swelling. He also has had urinary frequency and polydipsia. No dysuria.   H/o chronic RLE edema.   Scrotal U/S revealed increase in size of tail of epididymis and increase blood flow. Testicles appeared normal without mass. I reviewed all the images.   UA looks clear. WBC 23. Cr appears normal. His BS were increased.     Past Medical History  Diagnosis Date  . HTN (hypertension) 09/25/2011  . DM (diabetes mellitus) 09/25/2011  . Degenerative joint disease 09/25/2011  . Depression 09/25/2011  . Anxiety 09/25/2011  . Chronic pain syndrome 09/25/2011  . Basal cell cancer 09/25/2011    Mid back 2007  . Melanoma 09/25/2011    Right thigh 2005   Past Surgical History  Procedure Laterality Date  . Tumor removal from right thigh    . Lymph node removal      Home Medications:   (Not in a hospital admission) Allergies:  Allergies  Allergen Reactions  . Vancomycin Itching and Rash    Family History  Problem Relation Age of Onset  . Aneurysm Mother   . Cancer Father    Social History:  reports that he has been smoking Cigarettes.  He has been smoking about 0.50 packs per day. He has never used smokeless tobacco. He reports that he drinks alcohol. He reports that he does not use illicit drugs.  ROS: A complete review of systems was performed.  All systems are negative except for pertinent findings as noted. Review of Systems  All other systems reviewed and are negative.     Physical Exam:  Vital signs in last 24 hours: Temp:  [98.1 F (36.7 C)-99 F (37.2 C)] 98.1 F (36.7 C) (01/19 1702) Pulse Rate:  [87-98] 98 (01/19 1852) Resp:  [18] 18 (01/19 1302) BP: (128-152)/(66-80) 152/72 mmHg (01/19 1852) SpO2:  [95 %-97 %] 95 % (01/19  1852) General:  Alert and oriented, No acute distress HEENT: Normocephalic, atraumatic Neck: No JVD or lymphadenopathy Cardiovascular: Regular rate and rhythm Lungs: Regular rate and effort Abdomen: Soft, nontender, nondistended, no abdominal masses Back: No CVA tenderness Extremities: No edema Neurologic: Grossly intact GU: right testicle and epididymis are enlarged and painful, right hemiscrotum has edema and erythema up into the right inguinal area. No induration or fluctuance. No necrosis. Leg/thigh normal. Penis and left hemiscrotum normal.   Laboratory Data:  Results for orders placed during the hospital encounter of 06/26/13 (from the past 24 hour(s))  CBC     Status: Abnormal   Collection Time    06/26/13  2:45 PM      Result Value Range   WBC 23.0 (*) 4.0 - 10.5 K/uL   RBC 4.88  4.22 - 5.81 MIL/uL   Hemoglobin 15.2  13.0 - 17.0 g/dL   HCT 43.5  39.0 - 52.0 %   MCV 89.1  78.0 - 100.0 fL   MCH 31.1  26.0 - 34.0 pg   MCHC 34.9  30.0 - 36.0 g/dL   RDW 12.4  11.5 - 15.5 %   Platelets 260  150 - 400 K/uL  BASIC METABOLIC PANEL     Status: Abnormal   Collection Time    06/26/13  2:45 PM      Result Value Range   Sodium  129 (*) 137 - 147 mEq/L   Potassium 5.2  3.7 - 5.3 mEq/L   Chloride 93 (*) 96 - 112 mEq/L   CO2 21  19 - 32 mEq/L   Glucose, Bld 526 (*) 70 - 99 mg/dL   BUN 26 (*) 6 - 23 mg/dL   Creatinine, Ser 0.96  0.50 - 1.35 mg/dL   Calcium 9.2  8.4 - 10.5 mg/dL   GFR calc non Af Amer >90  >90 mL/min   GFR calc Af Amer >90  >90 mL/min  URINALYSIS, ROUTINE W REFLEX MICROSCOPIC     Status: Abnormal   Collection Time    06/26/13  3:14 PM      Result Value Range   Color, Urine YELLOW  YELLOW   APPearance CLEAR  CLEAR   Specific Gravity, Urine 1.038 (*) 1.005 - 1.030   pH 5.0  5.0 - 8.0   Glucose, UA >1000 (*) NEGATIVE mg/dL   Hgb urine dipstick NEGATIVE  NEGATIVE   Bilirubin Urine NEGATIVE  NEGATIVE   Ketones, ur NEGATIVE  NEGATIVE mg/dL   Protein, ur NEGATIVE   NEGATIVE mg/dL   Urobilinogen, UA 0.2  0.0 - 1.0 mg/dL   Nitrite NEGATIVE  NEGATIVE   Leukocytes, UA NEGATIVE  NEGATIVE  URINE MICROSCOPIC-ADD ON     Status: None   Collection Time    06/26/13  3:14 PM      Result Value Range   Squamous Epithelial / LPF RARE  RARE   WBC, UA 0-2  <3 WBC/hpf   RBC / HPF 0-2  <3 RBC/hpf  GLUCOSE, CAPILLARY     Status: Abnormal   Collection Time    06/26/13  4:48 PM      Result Value Range   Glucose-Capillary 387 (*) 70 - 99 mg/dL   No results found for this or any previous visit (from the past 240 hour(s)). Creatinine:  Recent Labs  06/24/13 2223 06/26/13 1445  CREATININE 0.89 0.96    Impression/Assessment/Plan: 1) right epididymo-orchitis - no evidence of cellulitis on exam. Erythema and edema appear to be reactive to underlying testicle/epididymis. I recommend treat with aggressive IV abx for epididymo-orchitis in setting of worsening wbc, DM, etc. Consider ID consult for abx. No surgical intervention needed at current time. Discussed with Government social research officer.  2) Uncontrolled DM , hyponatremia - per primary team    [name] 06/26/2013, 7:14 PM

## 2013-06-26 NOTE — ED Notes (Signed)
CBG 387 re[prted to [employer] T.

## 2013-06-26 NOTE — ED Provider Notes (Signed)
Ultrasound did not show abscess. Also showed possible epididymitis. Admitted to internal medicine  Charles Manning. Alvino Chapel, MD 06/26/13 [phone]

## 2013-06-26 NOTE — ED Notes (Signed)
Mid-level [facility] notified regarding patient FBS of 447. New orders given. Patient to go to floor at this time. She will follow up there.

## 2013-06-27 DIAGNOSIS — R7309 Other abnormal glucose: Secondary | ICD-10-CM

## 2013-06-27 DIAGNOSIS — N451 Epididymitis: Secondary | ICD-10-CM | POA: Diagnosis present

## 2013-06-27 LAB — BASIC METABOLIC PANEL
BUN: 17 mg/dL (ref 6–23)
BUN: 21 mg/dL (ref 6–23)
CO2: 20 mEq/L (ref 19–32)
CO2: 20 mEq/L (ref 19–32)
Calcium: 7.8 mg/dL — ABNORMAL LOW (ref 8.4–10.5)
Calcium: 8.6 mg/dL (ref 8.4–10.5)
Chloride: 92 mEq/L — ABNORMAL LOW (ref 96–112)
Chloride: 97 mEq/L (ref 96–112)
Creatinine, Ser: 0.76 mg/dL (ref 0.50–1.35)
Creatinine, Ser: 0.85 mg/dL (ref 0.50–1.35)
GFR calc Af Amer: >90 mL/min (ref >90)
GFR calc Af Amer: >90 mL/min (ref >90)
GFR calc non Af Amer: >90 mL/min (ref >90)
GFR calc non Af Amer: >90 mL/min (ref >90)
Glucose, Bld: 282 mg/dL — ABNORMAL HIGH (ref 70–99)
Glucose, Bld: 392 mg/dL — ABNORMAL HIGH (ref 70–99)
Potassium: 3.9 mEq/L (ref 3.7–5.3)
Potassium: 4.8 mEq/L (ref 3.7–5.3)
Sodium: 127 mEq/L — ABNORMAL LOW (ref 137–147)
Sodium: 128 mEq/L — ABNORMAL LOW (ref 137–147)

## 2013-06-27 LAB — GLUCOSE, CAPILLARY
GLUCOSE-CAPILLARY: 365 mg/dL — AB (ref 70–99)
GLUCOSE-CAPILLARY: 263 mg/dL — AB (ref 70–99)
GLUCOSE-CAPILLARY: 295 mg/dL — AB (ref 70–99)
Glucose-Capillary: 378 mg/dL — ABNORMAL HIGH (ref 70–99)
Glucose-Capillary: 358 mg/dL — ABNORMAL HIGH (ref 70–99)
Glucose-Capillary: 217 mg/dL — ABNORMAL HIGH (ref 70–99)
Glucose-Capillary: 232 mg/dL — ABNORMAL HIGH (ref 70–99)
Glucose-Capillary: 142 mg/dL — ABNORMAL HIGH (ref 70–99)
Glucose-Capillary: 148 mg/dL — ABNORMAL HIGH (ref 70–99)
Glucose-Capillary: 249 mg/dL — ABNORMAL HIGH (ref 70–99)
Glucose-Capillary: 299 mg/dL — ABNORMAL HIGH (ref 70–99)

## 2013-06-27 LAB — CBC
HCT: 37.3 % — ABNORMAL LOW (ref 39.0–52.0)
Hemoglobin: 13 g/dL (ref 13.0–17.0)
MCH: 30.7 pg (ref 26.0–34.0)
MCHC: 34.9 g/dL (ref 30.0–36.0)
MCV: 88 fL (ref 78.0–100.0)
PLATELETS: 253 10*3/uL (ref 150–400)
RBC: 4.24 MIL/uL (ref 4.22–5.81)
RDW: 12.1 % (ref 11.5–15.5)
WBC: 22.3 10*3/uL — AB (ref 4.0–10.5)

## 2013-06-27 LAB — HEMOGLOBIN A1C
Hgb A1c MFr Bld: 14.7 % — ABNORMAL HIGH (ref <5.7)
MEAN PLASMA GLUCOSE: 375 mg/dL — AB (ref <117)

## 2013-06-27 MED ORDER — INSULIN GLARGINE 100 UNIT/ML ~~LOC~~ SOLN
40.0000 [IU] | Freq: Every day | SUBCUTANEOUS | Status: DC
  Administered 2013-06-27 – 2013-06-28 (×2): 40 [IU] via SUBCUTANEOUS
  Filled 2013-06-27 (×2): qty 0.4

## 2013-06-27 MED ORDER — LEVOFLOXACIN 500 MG PO TABS
500.0000 mg | ORAL_TABLET | Freq: Every day | ORAL | Status: DC
  Administered 2013-06-28: 500 mg via ORAL
  Filled 2013-06-27: qty 1

## 2013-06-27 MED ORDER — SODIUM CHLORIDE 0.9 % IV SOLN
Freq: Once | INTRAVENOUS | Status: AC
  Administered 2013-06-27: 01:00:00 via INTRAVENOUS

## 2013-06-27 MED ORDER — INSULIN ASPART 100 UNIT/ML ~~LOC~~ SOLN
0.0000 [IU] | Freq: Three times a day (TID) | SUBCUTANEOUS | Status: DC
  Administered 2013-06-27: 5 [IU] via SUBCUTANEOUS
  Administered 2013-06-27: 8 [IU] via SUBCUTANEOUS
  Administered 2013-06-28: 5 [IU] via SUBCUTANEOUS
  Administered 2013-06-28 (×2): 8 [IU] via SUBCUTANEOUS
  Administered 2013-06-29: 3 [IU] via SUBCUTANEOUS
  Administered 2013-06-29 – 2013-06-30 (×2): 8 [IU] via SUBCUTANEOUS
  Administered 2013-06-30: 3 [IU] via SUBCUTANEOUS
  Administered 2013-07-01: 8 [IU] via SUBCUTANEOUS
  Administered 2013-07-01 (×2): 3 [IU] via SUBCUTANEOUS
  Administered 2013-07-02: 5 [IU] via SUBCUTANEOUS
  Administered 2013-07-02: 8 [IU] via SUBCUTANEOUS
  Administered 2013-07-03: 5 [IU] via SUBCUTANEOUS
  Administered 2013-07-03 – 2013-07-05 (×3): 2 [IU] via SUBCUTANEOUS

## 2013-06-27 MED ORDER — NICOTINE 21 MG/24HR TD PT24
21.0000 mg | MEDICATED_PATCH | Freq: Every day | TRANSDERMAL | Status: DC
  Administered 2013-06-27 – 2013-07-07 (×11): 21 mg via TRANSDERMAL
  Filled 2013-06-27 (×11): qty 1

## 2013-06-27 MED ORDER — INSULIN ASPART 100 UNIT/ML ~~LOC~~ SOLN
4.0000 [IU] | Freq: Three times a day (TID) | SUBCUTANEOUS | Status: DC
  Administered 2013-06-27 (×2): 4 [IU] via SUBCUTANEOUS

## 2013-06-27 MED ORDER — DEXTROSE 50 % IV SOLN: 25.0000 mL | INTRAVENOUS | Status: DC | PRN

## 2013-06-27 MED ORDER — HYDROMORPHONE HCL PF 1 MG/ML IJ SOLN
0.5000 mg | INTRAMUSCULAR | Status: DC | PRN
  Administered 2013-06-27 – 2013-06-29 (×9): 0.5 mg via INTRAVENOUS
  Filled 2013-06-27 (×9): qty 1

## 2013-06-27 MED ORDER — DEXTROSE-NACL 5-0.45 % IV SOLN
INTRAVENOUS | Status: DC
  Administered 2013-06-27: 100 mL/h via INTRAVENOUS
  Administered 2013-06-27 – 2013-06-29 (×3): via INTRAVENOUS
  Administered 2013-06-30: 100 mL via INTRAVENOUS
  Administered 2013-07-01 – 2013-07-02 (×3): via INTRAVENOUS
  Administered 2013-07-03: 1000 mL via INTRAVENOUS
  Administered 2013-07-03 – 2013-07-07 (×6): via INTRAVENOUS

## 2013-06-27 MED ORDER — LEVOFLOXACIN IN D5W 750 MG/150ML IV SOLN
750.0000 mg | INTRAVENOUS | Status: DC
  Administered 2013-06-27: 750 mg via INTRAVENOUS
  Filled 2013-06-27: qty 150

## 2013-06-27 MED ORDER — KETOROLAC TROMETHAMINE 30 MG/ML IJ SOLN
30.0000 mg | Freq: Four times a day (QID) | INTRAMUSCULAR | Status: DC | PRN
  Administered 2013-06-27 – 2013-06-28 (×3): 30 mg via INTRAVENOUS
  Filled 2013-06-27 (×3): qty 1

## 2013-06-27 MED ORDER — POTASSIUM CHLORIDE 10 MEQ/100ML IV SOLN
10.0000 meq | INTRAVENOUS | Status: AC
  Administered 2013-06-27 (×2): 10 meq via INTRAVENOUS
  Filled 2013-06-27 (×2): qty 100

## 2013-06-27 MED ORDER — SODIUM CHLORIDE 0.9 % IV SOLN
INTRAVENOUS | Status: DC
  Administered 2013-06-27: 3.1 [IU]/h via INTRAVENOUS
  Filled 2013-06-27: qty 1

## 2013-06-27 MED ORDER — SODIUM CHLORIDE 0.9 % IV SOLN: INTRAVENOUS | Status: DC

## 2013-06-27 NOTE — Progress Notes (Signed)
Patient ID: Charles Manning, male   DOB: 1955-09-19, 58 y.o.   MRN: [id number]  Pt c/o scrotal and inguinal pain.   Filed Vitals:   06/27/13 0551  BP: 112/65  Pulse: 74  Temp: 98.8 F (37.1 C)  Resp: 20    PE: NAD Increased swelling of right hemiscrotum, enlargement of right testicle - scrotal edema appears simple, no crepitus or necrosis. No fluctuance. Erythema tracks superior to inguinal area and laterally toward perineum. These areas are also indurated, but again no drainage, fluctuance, crepitus or necrosis.   CBC    Component Value Date/Time   WBC 22.3* 06/27/2013 0357   WBC 12.1* 11/08/2008 0951   RBC 4.24 06/27/2013 0357   RBC 4.74 11/08/2008 0951   HGB 13.0 06/27/2013 0357   HGB 15.3 11/08/2008 0951   HCT 37.3* 06/27/2013 0357   HCT 44.6 11/08/2008 0951   PLT 253 06/27/2013 0357   PLT 254 11/08/2008 0951   MCV 88.0 06/27/2013 0357   MCV 94.0 11/08/2008 0951   MCH 30.7 06/27/2013 0357   MCH 32.3 11/08/2008 0951   MCHC 34.9 06/27/2013 0357   MCHC 34.3 11/08/2008 0951   RDW 12.1 06/27/2013 0357   RDW 12.9 11/08/2008 0951   LYMPHSABS 3.7 06/24/2013 2223   LYMPHSABS 3.3 11/08/2008 0951   MONOABS 1.4* 06/24/2013 2223   MONOABS 1.1* 11/08/2008 0951   EOSABS 0.5 06/24/2013 2223   EOSABS 0.8* 11/08/2008 0951   BASOSABS 0.0 06/24/2013 2223   BASOSABS 0.0 11/08/2008 0951    BMET    Component Value Date/Time   NA 128* 06/27/2013 0351   K 3.9 06/27/2013 0351   CL 97 06/27/2013 0351   CO2 20 06/27/2013 0351   GLUCOSE 282* 06/27/2013 0351   BUN 17 06/27/2013 0351   CREATININE 0.76 06/27/2013 0351   CALCIUM 7.8* 06/27/2013 0351   GFRNONAA >90 06/27/2013 0351   GFRAA >90 06/27/2013 0351    Imp/plan: - epididymo-orchitis - wbc and edema increased, no fever, tachycardia or hypotension. Still no obvious surgical role - debridement or drainage. Cont supportive care and broad spectrum abx.   The process does not appear to involve thigh or his prior incision for melanoma, but its unkown how extensive the inguinal  lymph node dissection might have been and if this is contributing to the induration/edema.

## 2013-06-27 NOTE — Consult Note (Addendum)
    [facility] for [facility]     Reason for Consult: epididymitis-orchitis   Referring Physician: Dr. Clementeen Graham  Active Problems:   Diabetes mellitus type 2 with complications, uncontrolled   Anxiety   Chronic pain syndrome   Cellulitis of groin, right   Cellulitis   Epididymitis   . citalopram  20 mg Oral Daily  . doxycycline  100 mg Oral Q12H  . enoxaparin (LOVENOX) injection  40 mg Subcutaneous Q24H  . fenofibrate  160 mg Oral Daily  . insulin aspart  0-15 Units Subcutaneous TID WC  . insulin aspart  4 Units Subcutaneous TID WC  . insulin glargine  40 Units Subcutaneous Daily  . [START ON 06/28/2013] levofloxacin  500 mg Oral Daily  . lisinopril  10 mg Oral Daily  . QUEtiapine Fumarate  150 mg Oral QHS    Recommendations: Add levaquin for E coli, Pseudomonas Check GC and chlamydia urine D/c clindamycin   Assessment: Scrotal swelling with [place] c/w epididimo-orchitis.  No signs of Fornier's, patient is not ill appearing.  WBC has not improved so will change to levaquin for above.     Antibiotics: Doxycycline clindamycin  HPI: Charles Manning is a 58 y.o. male with history of melenoma in 2005 who was doing well until last Friday noted a lump around the old incision site.  Then developed scrotal swelling, redness.  Came in to ED and [place] with epididymitis.  Cellulisiti of scrotal area.  Started on doxy and clindamycin but WBC has not improved and scrotum has not improved.  Has not been hypotensive, not febrile but with subjective fever and chills at home.  No new sexual partners.  REcently saw girlfriend.  No recent penile discharge or dysuria.     Review of Systems: Pertinent items are noted in HPI.  Past Medical History  Diagnosis Date  . HTN (hypertension) 09/25/2011  . DM (diabetes mellitus) 09/25/2011  . Degenerative joint disease 09/25/2011  . Depression 09/25/2011  . Anxiety 09/25/2011  . Chronic pain syndrome 09/25/2011  . Basal cell cancer 09/25/2011   Mid back 2007  . Melanoma 09/25/2011    Right thigh 2005    History  Substance Use Topics  . Smoking status: Current Every Day Smoker -- 0.50 packs/day    Types: Cigarettes  . Smokeless tobacco: Never Used  . Alcohol Use: Yes     Comment: seldom    Family History  Problem Relation Age of Onset  . Aneurysm Mother   . Cancer Father    Allergies  Allergen Reactions  . Vancomycin Itching and Rash    OBJECTIVE: Blood pressure 112/74, pulse 78, temperature 97.7 F (36.5 C), temperature source Oral, resp. rate 18, height 5\' 9"  (1.753 m), weight 194 lb 10.7 oz (88.3 kg), SpO2 96.00%. General: Awake, alert, nad Skin: no rashes Lungs: CTA B Cor: RRR Abdomen: wsoft, nt, nd GU: scrotal edema, tenderness, erythema, does notextend to leg  Microbiology: No results found for this or any previous visit (from the past 240 hour(s)).  Scharlene Gloss, [facility] [place] [email or web address] [phone] pager  [phone] cell 06/27/2013, 2:19 PM

## 2013-06-27 NOTE — Progress Notes (Signed)
I asked Dr. Linus Salmons with ID to see patient and make abx recommendations.

## 2013-06-27 NOTE — Progress Notes (Signed)
[facility] PROGRESS NOTE  BRYEN HINDERMAN XBD:[id number] DOB: 19-Oct-1955 DOA: 06/26/2013 PCP: Benito Mccreedy, MD   Brief narrative 58 y.o. male with PMH of uncontrolled DM, chronic pain, HTN, depression, h/o melanoma resection from R groin in 2005, and chronic Rt leg lymphedema presented to the ER with redness and swelling over right groin for past 2 days extending to the scrotum with subjective fevers and chills.   He noticed pain and redness in the R groin 2 days ago, the pain and redness extended down to his scrotum.  In ER, noted to have hyperglycemia, leukocytosis and [place] with R epididymitis with  cellulitis.  Assessment/Plan: Right epididymo-orchitis On empiric coverage with IV clindamycin and doxycycline. i have added levaquin after discussion with ID. Dr Linus Salmons consulted who will see pt today. Appreciate urology recommendations. Leucocytosis worsened today. As per urology edema has increased as well. Will continue to monitor. Continue pain control. i have reduced the dose of dilaudid as patient appears quite drowsy. Will add IV Toradol.  -continue IV fluids and prn antiemetics.  uncontrolled DM Persistent hyperglycemia overnight with fsg in 500s with AG of 15. Placed on glucose stabilizer. Now improved and AG closed. Topped insulin drip and placed on lantus 40 units daily with  premeal coverage. He is on insulin 70/30 at home. Check A1C  Depression  -continue celexa and seroquel    Hyponatremia  -due to hyperglycemia and dehydration continue fluids and tight blood glucose control  DVT proph: lovenox  Diet: diabetic   Code Status: full code Family Communication: none at bedside Disposition Plan: home once improved   Consultants:  Urology  ID   Procedure:   none   Antibiotics:  IV clinda and doxy ( 1/19>>)  IV levaquin ( 1/20>>)  HPI/Subjective: Patient c/o scrotal pain and  nausea, appears sleepy  Objective: Filed Vitals:   06/27/13 1032  BP:  122/76  Pulse: 95  Temp: 99.7 F (37.6 C)  Resp: 20    Intake/Output Summary (Last 24 hours) at 06/27/13 1057 Last data filed at 06/27/13 0211  Gross per 24 hour  Intake      0 ml  Output    300 ml  Net   -300 ml   Filed Weights   06/26/13 2300  Weight: 88.3 kg (194 lb 10.7 oz)    Exam:   General:  Middle aged male lying in bed appears sleepy and drowsy  HEENT: no Pallor, dry oral mucosa   chest: clear b/l, no added sounds  CVS: NS1&S2,, no MRG  Abd: soft, NT, ND, BS+, swollen scrotum with erythema, no discharge. Warmth and  Tender to palaption  Ext: warm, no edema   CNS: sleepy , awake to commands and oriented  Data Reviewed: Basic Metabolic Panel:  Recent Labs Lab 06/24/13 2223 06/26/13 1445 06/26/13 2330 06/27/13 0351  NA 135* 129* 127* 128*  K 3.6* 5.2 4.8 3.9  CL 98 93* 92* 97  CO2 23 21 20 20   GLUCOSE 202* 526* 392* 282*  BUN 31* 26* 21 17  CREATININE 0.89 0.96 0.85 0.76  CALCIUM 9.3 9.2 8.6 7.8*   Liver Function Tests:  Recent Labs Lab 06/24/13 2223  AST 72*  ALT 97*  ALKPHOS 81  BILITOT 0.5  PROT 7.0  ALBUMIN 3.5   No results found for this basename: LIPASE, AMYLASE,  in the last 168 hours No results found for this basename: AMMONIA,  in the last 168 hours CBC:  Recent Labs Lab 06/24/13 2223 06/26/13 1445  06/27/13 0357  WBC 17.8* 23.0* 22.3*  NEUTROABS 12.2*  --   --   HGB 15.8 15.2 13.0  HCT 43.4 43.5 37.3*  MCV 86.1 89.1 88.0  PLT 281 260 253   Cardiac Enzymes:  Recent Labs Lab 06/24/13 2223  CKTOTAL 109   BNP (last 3 results) No results found for this basename: PROBNP,  in the last 8760 hours CBG:  Recent Labs Lab 06/27/13 0407 06/27/13 0433 06/27/13 0521 06/27/13 0626 06/27/13 0753  GLUCAP 263* 217* 232* 142* 148*    No results found for this or any previous visit (from the past 240 hour(s)).   Studies: US Scrotum  06/26/2013   CLINICAL DATA:  Right scrotal swelling, redness and pain.  EXAM: SCROTAL  ULTRASOUND  DOPPLER ULTRASOUND OF THE TESTICLES  TECHNIQUE: Complete ultrasound examination of the testicles, epididymis, and other scrotal structures was performed. Color and spectral Doppler ultrasound were also utilized to evaluate blood flow to the testicles.  COMPARISON:  None.  FINDINGS: Right testicle  Measurements: 4 cm x 2.1 cm x 4.7 cm. No mass or microlithiasis visualized.  Left testicle  Measurements: 4.7 cm x 2.6 cm x 3.3 cm. No mass or microlithiasis visualized.  Right epididymis: Mildly enlarged with increased vascularity mostly to the tail. No mass.  Left epididymis: Normal in size in echogenicity. No increased blood flow.  Hydrocele:  None visualized.  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  Pulsed Doppler interrogation of both testes demonstrates there is right scrotal skin thickening with hyperemia surrounding the right testicle within the deep layers of the scrotum.  IMPRESSION: 1. Findings support right epididymitis as well as edema or possibly cellulitis of the right scrotum. No hydroceles. 2. 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 3. Normal testicles.  No testicular mass or torsion   Electronically Signed   By: Lajean Manes M.D.   On: 06/26/2013 16:30   [place] Art/ven Flow Abd Pelv Doppler  06/26/2013   CLINICAL DATA:  Right scrotal swelling, redness and pain.  EXAM: SCROTAL ULTRASOUND  DOPPLER ULTRASOUND OF THE TESTICLES  TECHNIQUE: Complete ultrasound examination of the testicles, epididymis, and other scrotal structures was performed. Color and spectral Doppler ultrasound were also utilized to evaluate blood flow to the testicles.  COMPARISON:  None.  FINDINGS: Right testicle  Measurements: 4 cm x 2.1 cm x 4.7 cm. No mass or microlithiasis visualized.  Left testicle  Measurements: 4.7 cm x 2.6 cm x 3.3 cm. No mass or microlithiasis visualized.  Right epididymis: Mildly enlarged with increased vascularity mostly to the tail. No mass.  Left epididymis: Normal in size in  echogenicity. No increased blood flow.  Hydrocele:  None visualized.  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  Pulsed Doppler interrogation of both testes demonstrates there is right scrotal skin thickening with hyperemia surrounding the right testicle within the deep layers of the scrotum.  IMPRESSION: 1. Findings support right epididymitis as well as edema or possibly cellulitis of the right scrotum. No hydroceles. 2. 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 3. Normal testicles.  No testicular mass or torsion   Electronically Signed   By: Lajean Manes M.D.   On: 06/26/2013 16:30    Scheduled Meds: . citalopram  20 mg Oral Daily  . clindamycin (CLEOCIN) IV  300 mg Intravenous Q8H  . doxycycline  100 mg Oral Q12H  . enoxaparin (LOVENOX) injection  40 mg Subcutaneous Q24H  . fenofibrate  160 mg Oral Daily  . insulin aspart  0-15 Units Subcutaneous TID WC  . insulin aspart  4 Units Subcutaneous TID WC  . insulin glargine  40 Units Subcutaneous Daily  . levofloxacin (LEVAQUIN) IV  750 mg Intravenous Q24H  . lisinopril  10 mg Oral Daily  . QUEtiapine Fumarate  150 mg Oral QHS   Continuous Infusions: . sodium chloride 100 mL/hr at 06/26/13 2252  . dextrose 5 % and 0.45% NaCl 100 mL/hr (06/27/13 0945)      Time spent: 25 minutes    Penelopi Mikrut, [place]  [facility] Pager [phone]. If 7PM-7AM, please contact night-coverage at [email or web address], password [facility] 06/27/2013, 10:57 AM  LOS: 1 day

## 2013-06-28 ENCOUNTER — Encounter (HOSPITAL_COMMUNITY)
Admission: EM | Disposition: A | Discharge status: Home Health | Payer: Self-pay | Source: Home / Self Care | Attending: Internal Medicine

## 2013-06-28 ENCOUNTER — Encounter (HOSPITAL_COMMUNITY): Payer: Medicare Other | Admitting: Anesthesiology

## 2013-06-28 ENCOUNTER — Inpatient Hospital Stay (HOSPITAL_COMMUNITY): Payer: Medicare Other | Admitting: Anesthesiology

## 2013-06-28 DIAGNOSIS — N498 Inflammatory disorders of other specified male genital organs: Secondary | ICD-10-CM

## 2013-06-28 DIAGNOSIS — F329 Major depressive disorder, single episode, unspecified: Secondary | ICD-10-CM

## 2013-06-28 DIAGNOSIS — F3289 Other specified depressive episodes: Secondary | ICD-10-CM

## 2013-06-28 DIAGNOSIS — L0291 Cutaneous abscess, unspecified: Secondary | ICD-10-CM

## 2013-06-28 DIAGNOSIS — L0390 Cellulitis, unspecified: Secondary | ICD-10-CM

## 2013-06-28 HISTORY — PX: INCISION AND DRAINAGE ABSCESS: SHX5864

## 2013-06-28 LAB — CBC WITH DIFFERENTIAL/PLATELET
BASOS ABS: 0 10*3/uL (ref 0.0–0.1)
Basophils Relative: 0 % (ref 0–1)
Eosinophils Absolute: 0.2 10*3/uL (ref 0.0–0.7)
Eosinophils Relative: 1 % (ref 0–5)
HEMATOCRIT: 36.2 % — AB (ref 39.0–52.0)
Hemoglobin: 12.6 g/dL — ABNORMAL LOW (ref 13.0–17.0)
LYMPHS PCT: 10 % — AB (ref 12–46)
Lymphs Abs: 2 10*3/uL (ref 0.7–4.0)
MCH: 30.7 pg (ref 26.0–34.0)
MCHC: 34.8 g/dL (ref 30.0–36.0)
MCV: 88.1 fL (ref 78.0–100.0)
Monocytes Absolute: 1.9 10*3/uL — ABNORMAL HIGH (ref 0.1–1.0)
Monocytes Relative: 9 % (ref 3–12)
NEUTROS PCT: 80 % — AB (ref 43–77)
Neutro Abs: 16.8 10*3/uL — ABNORMAL HIGH (ref 1.7–7.7)
PLATELETS: 271 10*3/uL (ref 150–400)
RBC: 4.11 MIL/uL — ABNORMAL LOW (ref 4.22–5.81)
RDW: 12.1 % (ref 11.5–15.5)
WBC: 21 10*3/uL — ABNORMAL HIGH (ref 4.0–10.5)

## 2013-06-28 LAB — GLUCOSE, CAPILLARY
GLUCOSE-CAPILLARY: 186 mg/dL — AB (ref 70–99)
Glucose-Capillary: 285 mg/dL — ABNORMAL HIGH (ref 70–99)
Glucose-Capillary: 264 mg/dL — ABNORMAL HIGH (ref 70–99)
Glucose-Capillary: 219 mg/dL — ABNORMAL HIGH (ref 70–99)
Glucose-Capillary: 191 mg/dL — ABNORMAL HIGH (ref 70–99)

## 2013-06-28 LAB — PROTIME-INR
INR: 1.1 (ref 0.00–1.49)
Prothrombin Time: 14 seconds (ref 11.6–15.2)

## 2013-06-28 LAB — BASIC METABOLIC PANEL
BUN: 20 mg/dL (ref 6–23)
CHLORIDE: 95 meq/L — AB (ref 96–112)
CO2: 20 mEq/L (ref 19–32)
Calcium: 8.2 mg/dL — ABNORMAL LOW (ref 8.4–10.5)
Creatinine, Ser: 0.77 mg/dL (ref 0.50–1.35)
GFR calc non Af Amer: >90 mL/min (ref >90)
Glucose, Bld: 310 mg/dL — ABNORMAL HIGH (ref 70–99)
POTASSIUM: 4.4 meq/L (ref 3.7–5.3)
SODIUM: 130 meq/L — AB (ref 137–147)

## 2013-06-28 LAB — SURGICAL PCR SCREEN
MRSA, PCR: NEGATIVE
STAPHYLOCOCCUS AUREUS: NEGATIVE

## 2013-06-28 LAB — GRAM STAIN: Gram Stain: NONE SEEN

## 2013-06-28 LAB — APTT: aPTT: 30 seconds (ref 24–37)

## 2013-06-28 SURGERY — INCISION AND DRAINAGE, ABSCESS
Anesthesia: General

## 2013-06-28 MED ORDER — ACETAMINOPHEN 10 MG/ML IV SOLN
1000.0000 mg | Freq: Once | INTRAVENOUS | Status: AC
  Administered 2013-06-28: 1000 mg via INTRAVENOUS
  Filled 2013-06-28: qty 100

## 2013-06-28 MED ORDER — DIPHENHYDRAMINE HCL 50 MG/ML IJ SOLN: 12.5000 mg | Freq: Four times a day (QID) | INTRAMUSCULAR | Status: DC | PRN

## 2013-06-28 MED ORDER — PIPERACILLIN-TAZOBACTAM 3.375 G IVPB
3.3750 g | Freq: Three times a day (TID) | INTRAVENOUS | Status: DC
  Administered 2013-06-29 – 2013-07-01 (×7): 3.375 g via INTRAVENOUS
  Filled 2013-06-28 (×10): qty 50

## 2013-06-28 MED ORDER — INSULIN ASPART 100 UNIT/ML ~~LOC~~ SOLN
4.0000 [IU] | Freq: Three times a day (TID) | SUBCUTANEOUS | Status: DC
  Administered 2013-06-28: 13:00:00 via SUBCUTANEOUS
  Administered 2013-06-28: 4 [IU] via SUBCUTANEOUS

## 2013-06-28 MED ORDER — NALOXONE HCL 0.4 MG/ML IJ SOLN: 0.4000 mg | INTRAMUSCULAR | Status: DC | PRN

## 2013-06-28 MED ORDER — FENTANYL CITRATE 0.05 MG/ML IJ SOLN
INTRAMUSCULAR | Status: AC
  Filled 2013-06-28: qty 5

## 2013-06-28 MED ORDER — GLYCOPYRROLATE 0.2 MG/ML IJ SOLN
INTRAMUSCULAR | Status: AC
  Filled 2013-06-28: qty 3

## 2013-06-28 MED ORDER — ONDANSETRON HCL 4 MG/2ML IJ SOLN
INTRAMUSCULAR | Status: DC | PRN
  Administered 2013-06-28: 4 mg via INTRAVENOUS

## 2013-06-28 MED ORDER — DEXAMETHASONE SODIUM PHOSPHATE 10 MG/ML IJ SOLN
INTRAMUSCULAR | Status: AC
  Filled 2013-06-28: qty 1

## 2013-06-28 MED ORDER — NEOSTIGMINE METHYLSULFATE 1 MG/ML IJ SOLN
INTRAMUSCULAR | Status: AC
  Filled 2013-06-28: qty 10

## 2013-06-28 MED ORDER — LACTATED RINGERS IV SOLN: INTRAVENOUS | Status: DC

## 2013-06-28 MED ORDER — NEOSTIGMINE METHYLSULFATE 1 MG/ML IJ SOLN
INTRAMUSCULAR | Status: DC | PRN
  Administered 2013-06-28: 5 mg via INTRAVENOUS

## 2013-06-28 MED ORDER — HYDROMORPHONE 0.3 MG/ML IV SOLN
INTRAVENOUS | Status: DC
  Administered 2013-06-28: 23:00:00 via INTRAVENOUS
  Administered 2013-06-29: 56.06 mg via INTRAVENOUS
  Administered 2013-06-29 (×2): via INTRAVENOUS
  Administered 2013-06-29: 3.5 mg via INTRAVENOUS
  Administered 2013-06-29: 7.91 mg via INTRAVENOUS
  Administered 2013-06-29: 2.7 mg via INTRAVENOUS
  Administered 2013-06-30: 11:00:00 via INTRAVENOUS
  Administered 2013-06-30: 6 mg via INTRAVENOUS
  Administered 2013-06-30: 21:00:00 via INTRAVENOUS
  Administered 2013-06-30: 8 mg via INTRAVENOUS
  Administered 2013-06-30: 2 mg via INTRAVENOUS
  Administered 2013-07-01: 4.5 mg via INTRAVENOUS
  Administered 2013-07-01: 18:00:00 via INTRAVENOUS
  Administered 2013-07-01: 5.7 mg via INTRAVENOUS
  Administered 2013-07-01: 3.3 mg via INTRAVENOUS
  Administered 2013-07-01: 0.6 mg via INTRAVENOUS
  Administered 2013-07-01: 07:00:00 via INTRAVENOUS
  Administered 2013-07-01: 3 mg via INTRAVENOUS
  Administered 2013-07-02: 2.91 mg via INTRAVENOUS
  Administered 2013-07-02: 3.6 mg via INTRAVENOUS
  Administered 2013-07-02: 2.7 mg via INTRAVENOUS
  Administered 2013-07-02: 15:00:00 via INTRAVENOUS
  Administered 2013-07-02: 1.8 mg via INTRAVENOUS
  Administered 2013-07-02: 2.1 mg via INTRAVENOUS
  Administered 2013-07-03 (×2): via INTRAVENOUS
  Administered 2013-07-03: 1.89 mg via INTRAVENOUS
  Administered 2013-07-03: 6.71 mg via INTRAVENOUS
  Administered 2013-07-03: 0.9 mg via INTRAVENOUS
  Administered 2013-07-03: 4.5 mg via INTRAVENOUS
  Administered 2013-07-03: 1.5 mg via INTRAVENOUS
  Administered 2013-07-04: 1.91 mg via INTRAVENOUS
  Administered 2013-07-04: 2.4 mg via INTRAVENOUS
  Administered 2013-07-04 (×2): via INTRAVENOUS
  Administered 2013-07-04: 2.1 mg via INTRAVENOUS
  Administered 2013-07-04: 1.8 mg via INTRAVENOUS
  Administered 2013-07-04: 3.41 mg via INTRAVENOUS
  Administered 2013-07-04: 6.9 mg via INTRAVENOUS
  Administered 2013-07-05: 03:00:00 via INTRAVENOUS
  Administered 2013-07-05: 5.1 mg via INTRAVENOUS
  Filled 2013-06-28 (×15): qty 25

## 2013-06-28 MED ORDER — PROPOFOL 10 MG/ML IV BOLUS
INTRAVENOUS | Status: AC
  Filled 2013-06-28: qty 20

## 2013-06-28 MED ORDER — SUCCINYLCHOLINE CHLORIDE 20 MG/ML IJ SOLN
INTRAMUSCULAR | Status: AC
  Filled 2013-06-28: qty 1

## 2013-06-28 MED ORDER — PROPOFOL 10 MG/ML IV BOLUS
INTRAVENOUS | Status: DC | PRN
  Administered 2013-06-28: 200 mg via INTRAVENOUS

## 2013-06-28 MED ORDER — INSULIN ASPART 100 UNIT/ML ~~LOC~~ SOLN: 4.0000 [IU] | Freq: Three times a day (TID) | SUBCUTANEOUS | Status: DC

## 2013-06-28 MED ORDER — LINEZOLID 2 MG/ML IV SOLN
600.0000 mg | Freq: Two times a day (BID) | INTRAVENOUS | Status: DC
  Administered 2013-06-29: 600 mg via INTRAVENOUS
  Filled 2013-06-28 (×3): qty 300

## 2013-06-28 MED ORDER — HYDROMORPHONE HCL PF 1 MG/ML IJ SOLN
0.2500 mg | INTRAMUSCULAR | Status: DC | PRN
  Administered 2013-06-28 (×4): 0.5 mg via INTRAVENOUS

## 2013-06-28 MED ORDER — INSULIN ASPART 100 UNIT/ML ~~LOC~~ SOLN
5.0000 [IU] | Freq: Three times a day (TID) | SUBCUTANEOUS | Status: DC
  Administered 2013-06-29 (×2): 5 [IU] via SUBCUTANEOUS
  Administered 2013-06-29: 08:00:00 via SUBCUTANEOUS
  Administered 2013-06-30: 5 [IU] via SUBCUTANEOUS

## 2013-06-28 MED ORDER — FENTANYL CITRATE 0.05 MG/ML IJ SOLN
INTRAMUSCULAR | Status: DC | PRN
  Administered 2013-06-28: 100 ug via INTRAVENOUS
  Administered 2013-06-28 (×3): 50 ug via INTRAVENOUS

## 2013-06-28 MED ORDER — MIDAZOLAM HCL 5 MG/5ML IJ SOLN
INTRAMUSCULAR | Status: DC | PRN
  Administered 2013-06-28: 2 mg via INTRAVENOUS

## 2013-06-28 MED ORDER — MORPHINE SULFATE 2 MG/ML IJ SOLN
2.0000 mg | INTRAMUSCULAR | Status: DC | PRN
  Administered 2013-06-30 – 2013-07-03 (×10): 2 mg via INTRAVENOUS
  Filled 2013-06-28 (×10): qty 1

## 2013-06-28 MED ORDER — INSULIN GLARGINE 100 UNIT/ML ~~LOC~~ SOLN
50.0000 [IU] | Freq: Every day | SUBCUTANEOUS | Status: DC
  Administered 2013-06-29: 50 [IU] via SUBCUTANEOUS
  Filled 2013-06-28 (×4): qty 0.5

## 2013-06-28 MED ORDER — ONDANSETRON HCL 4 MG/2ML IJ SOLN
4.0000 mg | Freq: Four times a day (QID) | INTRAMUSCULAR | Status: DC | PRN
Start: 2013-06-28 — End: 2013-06-28

## 2013-06-28 MED ORDER — GLYCOPYRROLATE 0.2 MG/ML IJ SOLN
INTRAMUSCULAR | Status: DC | PRN
  Administered 2013-06-28: 0.6 mg via INTRAVENOUS

## 2013-06-28 MED ORDER — LINEZOLID 2 MG/ML IV SOLN
600.0000 mg | Freq: Once | INTRAVENOUS | Status: AC
  Administered 2013-06-28: 600 mg via INTRAVENOUS
  Filled 2013-06-28: qty 300

## 2013-06-28 MED ORDER — PIPERACILLIN-TAZOBACTAM 3.375 G IVPB 30 MIN
3.3750 g | Freq: Once | INTRAVENOUS | Status: AC
  Administered 2013-06-28: 3.375 g via INTRAVENOUS
  Filled 2013-06-28: qty 50

## 2013-06-28 MED ORDER — LIDOCAINE HCL (CARDIAC) 20 MG/ML IV SOLN
INTRAVENOUS | Status: DC | PRN
  Administered 2013-06-28: 100 mg via INTRAVENOUS

## 2013-06-28 MED ORDER — DIPHENHYDRAMINE HCL 12.5 MG/5ML PO ELIX
12.5000 mg | ORAL_SOLUTION | Freq: Four times a day (QID) | ORAL | Status: DC | PRN
  Administered 2013-07-04: 12.5 mg via ORAL
  Filled 2013-06-28: qty 5

## 2013-06-28 MED ORDER — LIDOCAINE HCL (CARDIAC) 20 MG/ML IV SOLN
INTRAVENOUS | Status: AC
  Filled 2013-06-28: qty 5

## 2013-06-28 MED ORDER — ROCURONIUM BROMIDE 100 MG/10ML IV SOLN
INTRAVENOUS | Status: DC | PRN
  Administered 2013-06-28: 25 mg via INTRAVENOUS

## 2013-06-28 MED ORDER — LACTATED RINGERS IV SOLN
INTRAVENOUS | Status: DC | PRN
  Administered 2013-06-28: 20:00:00 via INTRAVENOUS

## 2013-06-28 MED ORDER — HYDROMORPHONE HCL PF 1 MG/ML IJ SOLN
INTRAMUSCULAR | Status: AC
  Filled 2013-06-28: qty 1

## 2013-06-28 MED ORDER — SODIUM CHLORIDE 0.9 % IR SOLN
Status: DC | PRN
Start: 2013-06-28 — End: 2013-06-28
  Administered 2013-06-28: 1000 mL

## 2013-06-28 MED ORDER — SUCCINYLCHOLINE CHLORIDE 20 MG/ML IJ SOLN
INTRAMUSCULAR | Status: DC | PRN
  Administered 2013-06-28: 100 mg via INTRAVENOUS

## 2013-06-28 MED ORDER — SODIUM CHLORIDE 0.9 % IJ SOLN: 9.0000 mL | INTRAMUSCULAR | Status: DC | PRN

## 2013-06-28 MED ORDER — MIDAZOLAM HCL 2 MG/2ML IJ SOLN
INTRAMUSCULAR | Status: AC
  Filled 2013-06-28: qty 2

## 2013-06-28 MED ORDER — ONDANSETRON HCL 4 MG/2ML IJ SOLN
INTRAMUSCULAR | Status: AC
  Filled 2013-06-28: qty 2

## 2013-06-28 SURGICAL SUPPLY — 30 items
BANDAGE GAUZE ELAST BULKY 4 IN (GAUZE/BANDAGES/DRESSINGS) ×3 IMPLANT
BLADE HEX COATED 2.75 (ELECTRODE) ×3 IMPLANT
DRAPE LAPAROSCOPIC ABDOMINAL (DRAPES) ×3 IMPLANT
DRAPE LG THREE QUARTER DISP (DRAPES) ×3 IMPLANT
ELECT REM PT RETURN 9FT ADLT (ELECTROSURGICAL) ×3
ELECTRODE REM PT RTRN 9FT ADLT (ELECTROSURGICAL) ×1 IMPLANT
GLOVE BIO SURGEON STRL SZ7.5 (GLOVE) ×4 IMPLANT
GLOVE BIOGEL M STRL SZ7.5 (GLOVE) ×2 IMPLANT
GLOVE BIOGEL PI IND STRL 7.0 (GLOVE) ×1 IMPLANT
GLOVE BIOGEL PI IND STRL 7.5 (GLOVE) IMPLANT
GLOVE BIOGEL PI INDICATOR 7.0 (GLOVE) ×2
GLOVE BIOGEL PI INDICATOR 7.5 (GLOVE) ×2
GOWN STRL REUS W/ TWL XL LVL3 (GOWN DISPOSABLE) ×1 IMPLANT
GOWN STRL REUS W/TWL LRG LVL3 (GOWN DISPOSABLE) ×1 IMPLANT
GOWN STRL REUS W/TWL XL LVL3 (GOWN DISPOSABLE) ×10 IMPLANT
KIT BASIN OR (CUSTOM PROCEDURE TRAY) ×3 IMPLANT
MARKER SKIN DUAL TIP RULER LAB (MISCELLANEOUS) IMPLANT
NS IRRIG 1000ML POUR BTL (IV SOLUTION) ×3 IMPLANT
PACK GENERAL/GYN (CUSTOM PROCEDURE TRAY) ×3 IMPLANT
PENCIL BUTTON HOLSTER BLD 10FT (ELECTRODE) ×3 IMPLANT
PLUG CATH AND CAP STER (CATHETERS) ×2 IMPLANT
SPONGE LAP 18X18 X RAY DECT (DISPOSABLE) IMPLANT
SUT CHROMIC 3 0 SH 27 (SUTURE) ×2 IMPLANT
SUT MNCRL AB 4-0 PS2 18 (SUTURE) IMPLANT
SUT VIC AB 3-0 SH 18 (SUTURE) IMPLANT
SWAB COLLECTION DEVICE MRSA (MISCELLANEOUS) ×2 IMPLANT
TOWEL OR 17X26 10 PK STRL BLUE (TOWEL DISPOSABLE) ×3 IMPLANT
TUBE ANAEROBIC SPECIMEN COL (MISCELLANEOUS) ×2 IMPLANT
WATER STERILE IRR 1500ML POUR (IV SOLUTION) ×3 IMPLANT
YANKAUER SUCT BULB TIP 10FT TU (MISCELLANEOUS) IMPLANT

## 2013-06-28 NOTE — Progress Notes (Signed)
[facility] PROGRESS NOTE  AADARSH COZORT TDD:[id number] DOB: 1956/05/16 DOA: 06/26/2013 PCP: Benito Mccreedy, MD   Assessment/Plan: Right epididymo-orchitis - Initially on empiric coverage with IV clindamycin and doxycycline. - Per ID recs, d/c clinda and add Levaquin - Appreciate urology recommendations. -continue IV fluids and prn antiemetics.  uncontrolled DM - Persistent hyperglycemia overnight with fsg in 500s with AG of 15.  - Placed on glucose stabilizer. Now improved and AG closed.  - Now on lantus 40 units daily with  premeal coverage. He is on insulin 70/30 at home.  Depression  -continue celexa and seroquel    Hyponatremia  -due to hyperglycemia and dehydration continue fluids and tight blood glucose control  DVT proph: lovenox  Diet: diabetic   Code Status: full code Family Communication: none at bedside Disposition Plan: home once improved   Consultants:  Urology  ID   Procedure:   none   Antibiotics:  IV clinda ( 1/19>>06/27/13)  IV doxycycline 1/191/5>>>  IV levaquin ( 1/20>>)  HPI/Subjective: Patient c/o scrotal pain and  nausea, appears sleepy  Objective: Filed Vitals:   06/28/13 0616  BP: 129/63  Pulse: 97  Temp: 98.2 F (36.8 C)  Resp: 20    Intake/Output Summary (Last 24 hours) at 06/28/13 0938 Last data filed at 06/28/13 0816  Gross per 24 hour  Intake   3995 ml  Output   1625 ml  Net   2370 ml   Filed Weights   06/26/13 2300  Weight: 88.3 kg (194 lb 10.7 oz)    Exam:   General:  Middle aged male lying in bed appears sleepy and drowsy  HEENT: no Pallor, dry oral mucosa   chest: clear b/l, no added sounds  CVS: NS1&S2,, no MRG  Abd: soft, NT, ND, BS+, swollen scrotum with erythema, no discharge. Warmth and  Tender to palaption  Ext: warm, no edema   CNS: sleepy , awake to commands and oriented  Data Reviewed: Basic Metabolic Panel:  Recent Labs Lab 06/24/13 2223 06/26/13 1445  06/26/13 2330 06/27/13 0351 06/28/13 0555  NA 135* 129* 127* 128* 130*  K 3.6* 5.2 4.8 3.9 4.4  CL 98 93* 92* 97 95*  CO2 23 21 20 20 20   GLUCOSE 202* 526* 392* 282* 310*  BUN 31* 26* 21 17 20   CREATININE 0.89 0.96 0.85 0.76 0.77  CALCIUM 9.3 9.2 8.6 7.8* 8.2*   Liver Function Tests:  Recent Labs Lab 06/24/13 2223  AST 72*  ALT 97*  ALKPHOS 81  BILITOT 0.5  PROT 7.0  ALBUMIN 3.5   No results found for this basename: LIPASE, AMYLASE,  in the last 168 hours No results found for this basename: AMMONIA,  in the last 168 hours CBC:  Recent Labs Lab 06/24/13 2223 06/26/13 1445 06/27/13 0357 06/28/13 0555  WBC 17.8* 23.0* 22.3* 21.0*  NEUTROABS 12.2*  --   --  16.8*  HGB 15.8 15.2 13.0 12.6*  HCT 43.4 43.5 37.3* 36.2*  MCV 86.1 89.1 88.0 88.1  PLT 281 260 253 271   Cardiac Enzymes:  Recent Labs Lab 06/24/13 2223  CKTOTAL 109   BNP (last 3 results) No results found for this basename: PROBNP,  in the last 8760 hours CBG:  Recent Labs Lab 06/27/13 0753 06/27/13 1152 06/27/13 1655 06/27/13 2246 06/28/13 0727  GLUCAP 148* 249* 299* 295* 285*    No results found for this or any previous visit (from the past 240 hour(s)).   Studies: US Scrotum  06/26/2013  CLINICAL DATA:  Right scrotal swelling, redness and pain.  EXAM: SCROTAL ULTRASOUND  DOPPLER ULTRASOUND OF THE TESTICLES  TECHNIQUE: Complete ultrasound examination of the testicles, epididymis, and other scrotal structures was performed. Color and spectral Doppler ultrasound were also utilized to evaluate blood flow to the testicles.  COMPARISON:  None.  FINDINGS: Right testicle  Measurements: 4 cm x 2.1 cm x 4.7 cm. No mass or microlithiasis visualized.  Left testicle  Measurements: 4.7 cm x 2.6 cm x 3.3 cm. No mass or microlithiasis visualized.  Right epididymis: Mildly enlarged with increased vascularity mostly to the tail. No mass.  Left epididymis: Normal in size in echogenicity. No increased blood flow.   Hydrocele:  None visualized.  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  Pulsed Doppler interrogation of both testes demonstrates there is right scrotal skin thickening with hyperemia surrounding the right testicle within the deep layers of the scrotum.  IMPRESSION: 1. Findings support right epididymitis as well as edema or possibly cellulitis of the right scrotum. No hydroceles. 2. 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 3. Normal testicles.  No testicular mass or torsion   Electronically Signed   By: Lajean Manes M.D.   On: 06/26/2013 16:30   [place] Art/ven Flow Abd Pelv Doppler  06/26/2013   CLINICAL DATA:  Right scrotal swelling, redness and pain.  EXAM: SCROTAL ULTRASOUND  DOPPLER ULTRASOUND OF THE TESTICLES  TECHNIQUE: Complete ultrasound examination of the testicles, epididymis, and other scrotal structures was performed. Color and spectral Doppler ultrasound were also utilized to evaluate blood flow to the testicles.  COMPARISON:  None.  FINDINGS: Right testicle  Measurements: 4 cm x 2.1 cm x 4.7 cm. No mass or microlithiasis visualized.  Left testicle  Measurements: 4.7 cm x 2.6 cm x 3.3 cm. No mass or microlithiasis visualized.  Right epididymis: Mildly enlarged with increased vascularity mostly to the tail. No mass.  Left epididymis: Normal in size in echogenicity. No increased blood flow.  Hydrocele:  None visualized.  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  Pulsed Doppler interrogation of both testes demonstrates there is right scrotal skin thickening with hyperemia surrounding the right testicle within the deep layers of the scrotum.  IMPRESSION: 1. Findings support right epididymitis as well as edema or possibly cellulitis of the right scrotum. No hydroceles. 2. 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 3. Normal testicles.  No testicular mass or torsion   Electronically Signed   By: Lajean Manes M.D.   On: 06/26/2013 16:30    Scheduled Meds: . citalopram  20 mg  Oral Daily  . doxycycline  100 mg Oral Q12H  . enoxaparin (LOVENOX) injection  40 mg Subcutaneous Q24H  . fenofibrate  160 mg Oral Daily  . insulin aspart  0-15 Units Subcutaneous TID WC  . insulin aspart  4 Units Subcutaneous TID WC  . insulin glargine  40 Units Subcutaneous Daily  . levofloxacin  500 mg Oral Daily  . lisinopril  10 mg Oral Daily  . nicotine  21 mg Transdermal Daily  . QUEtiapine Fumarate  150 mg Oral QHS   Continuous Infusions: . sodium chloride 100 mL/hr at 06/28/13 0458  . dextrose 5 % and 0.45% NaCl 100 mL/hr (06/27/13 0945)      Time spent: 25 minutes    Rigel Filsinger, [facility] Pager [phone]. If 7PM-7AM, please contact night-coverage at [email or web address], password [facility] 06/28/2013, 9:38 AM  LOS: 2 days

## 2013-06-28 NOTE — Progress Notes (Signed)
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 Vancomycin Itching and Rash    Patient Measurements: Height: 5\' 9"  (175.3 cm) Weight: 194 lb 10.7 oz (88.3 kg) IBW/kg (Calculated) : 70.7   Vital Signs: Temp: 98.4 F (36.9 C) (01/21 1839) Temp src: Oral (01/21 1839) BP: 139/82 mmHg (01/21 1839) Pulse Rate: 80 (01/21 1839) Intake/Output from previous day: 01/20 0701 - 01/21 0700 In: 3755 [P.O.:480; I.V.:3275] Out: 1325 [Urine:1325] Intake/Output from this shift:    Labs:  Recent Labs  06/26/13 1445 06/26/13 2330 06/27/13 0351 06/27/13 0357 06/28/13 0555  WBC 23.0*  --   --  22.3* 21.0*  HGB 15.2  --   --  13.0 12.6*  PLT 260  --   --  253 271  CREATININE 0.96 0.85 0.76  --  0.77   Estimated Creatinine Clearance: 114.7 ml/min (by C-G formula based on Cr of 0.77). No results found for this basename: Letta Median, VANCORANDOM, GENTTROUGH, GENTPEAK, GENTRANDOM, TOBRATROUGH, TOBRAPEAK, TOBRARND, AMIKACINPEAK, AMIKACINTROU, AMIKACIN,  in the last 72 hours    Assessment: 89 yoM presented 1/19 with R scrotal swelling, redness and pain. Ultrasound c/w epididimo-orchitis. ID on board, and changed abx to Levaquin, Doxycyline. On 1/21, pt noticed with incr swelling of the R hemiscrotum and enlargement of R testicle worrisome for Forniere's. Plan OR for I&D and MD would like to broaden abx to Linezolid and Zosyn. Note Vancomycin allergy noted in patient's chart with reaction of "itching/rash" but patient has tolerated Vancomycin for many days after allergy was documented in 2013. On-call ID physician (Dr. Megan Salon) made aware and plan to continue Linezolid and Zosyn for now, Dr. Linus Salmons will follow up in AM.   1/19 Ceftriaxone x 1  1/19 >> clindamycin >> 1/20 1/20 >> doxycycline >> 1/21 1/20 >> Levaquin >> 1/21 1/21 >> Linezolid >>  1/21 >> Zosyn >>    Tmax: Afeb  WBCs: 21K Renal: Scr 0.77, CG > 100 ml/min  1/21 abscess >> pending   Plan:   Linezolid 600 mg IV q12h  Zosyn 3.375 gm IV q8h, extended infusion over 4 hours  F/u with ID in AM  Vanessa [place], PharmD, BCPS Pager: [phone] 8:34 PM Pharmacy #: [id number]

## 2013-06-28 NOTE — Transfer of Care (Signed)
Immediate Anesthesia Transfer of Care Note  Patient: Charles Manning  Procedure(s) Performed: Procedure(s) (LRB): INCISION AND DRAINAGE SCROTAL ABSCESS (N/A)  Patient Location: PACU  Anesthesia Type: General  Level of Consciousness: sedated, patient cooperative and responds to stimulation  Airway & Oxygen Therapy: Patient Spontanous Breathing and Patient connected to face mask oxgen  Post-op Assessment: Report given to PACU RN and Post -op Vital signs reviewed and stable  Post vital signs: Reviewed and stable  Complications: No apparent anesthesia complications

## 2013-06-28 NOTE — Consult Note (Signed)
General surgery attending note:  I have not examined this patient, but I have discussed his care with Modena Jansky, [place]. I've reviewed photographs of the infection in the patient's scrotum and right groin. I am concerned that this is an uncontrolled infection and may require debridement in the operating room semi-.  I have made him n.p.o. And have discussed this with the head nurse. I've discussed the case with my partner, Dr. Autumn Messing who is coming over now to evaluate the patient for possible debridement in the OR tonight I would assume that the urology service would be involved as well, and Dr. Clois Comber aware that Dr. Junious Silk has been following.   Edsel Petrin. Dalbert Batman, M.D., [facility] General and Minimally invasive Surgery Breast and Colorectal Surgery Office:   [phone] Pager:   [phone]

## 2013-06-28 NOTE — Progress Notes (Signed)
Patient ID: Charles Manning, male   DOB: [birth year], 58 y.o.   MRN: [id number]  Pt c/o scrotal pain. On doxy , levaquin.   Filed Vitals:   06/28/13 0616  BP: 129/63  Pulse: 97  Temp: 98.2 F (36.8 C)  Resp: 20   PE: NAD  GU - significant increase in scrotal edema today compared to yesterday, no necrosis, no crepitus but the scrotal skin is ecchymotic with sloughing of skin. The scrotum is weeping a lot of simple fluid. The scrotal edema is simple, no fluctuance. New today is fluctuance and drainage in the right groin and over in the right perineum. There is not a lot of fluctuance here but it has increased some purulent drainage.   CBC    Component Value Date/Time   WBC 21.0* 06/28/2013 0555   WBC 12.1* 11/08/2008 0951   RBC 4.11* 06/28/2013 0555   RBC 4.74 11/08/2008 0951   HGB 12.6* 06/28/2013 0555   HGB 15.3 11/08/2008 0951   HCT 36.2* 06/28/2013 0555   HCT 44.6 11/08/2008 0951   PLT 271 06/28/2013 0555   PLT 254 11/08/2008 0951   MCV 88.1 06/28/2013 0555   MCV 94.0 11/08/2008 0951   MCH 30.7 06/28/2013 0555   MCH 32.3 11/08/2008 0951   MCHC 34.8 06/28/2013 0555   MCHC 34.3 11/08/2008 0951   RDW 12.1 06/28/2013 0555   RDW 12.9 11/08/2008 0951   LYMPHSABS 2.0 06/28/2013 0555   LYMPHSABS 3.3 11/08/2008 0951   MONOABS 1.9* 06/28/2013 0555   MONOABS 1.1* 11/08/2008 0951   EOSABS 0.2 06/28/2013 0555   EOSABS 0.8* 11/08/2008 0951   BASOSABS 0.0 06/28/2013 0555   BASOSABS 0.0 11/08/2008 0951    BMET    Component Value Date/Time   NA 130* 06/28/2013 0555   K 4.4 06/28/2013 0555   CL 95* 06/28/2013 0555   CO2 20 06/28/2013 0555   GLUCOSE 310* 06/28/2013 0555   BUN 20 06/28/2013 0555   CREATININE 0.77 06/28/2013 0555   CALCIUM 8.2* 06/28/2013 0555   GFRNONAA >90 06/28/2013 0555   GFRAA >90 06/28/2013 0555   Imp /plan -  Scrotal edema - intially I was concerned about epididymo-orchitis, but today the infection seems to be declaring itself and he has new fluctance and drainage from right inguinal down to right  perineum. He tells me he had an "infected drain in this area" after one of his prior surgeries.  He may need I&D and given his prior surgeries Dr. Wyline Copas will have Gen Surg come by for opinion on I&D. I discussed with Dr. Linus Salmons who feels we are covering the patient for skin and testicle anyway. It appears the scrotum has a lot of simple edema from the adjacent process and I wouldn't recommend a scrotal incision/exploration.

## 2013-06-28 NOTE — Progress Notes (Signed)
[facility]  Date of Admission:  06/26/2013  Antibiotics: Antibiotics Given (last 72 hours)   Date/Time Action Medication Dose Rate   06/27/13 0040 Given   doxycycline (VIBRA-TABS) tablet 100 mg 100 mg    06/27/13 0916 Given   doxycycline (VIBRA-TABS) tablet 100 mg 100 mg    06/27/13 1225 Given   levofloxacin (LEVAQUIN) IVPB 750 mg 750 mg 100 mL/hr   06/27/13 2101 Given   doxycycline (VIBRA-TABS) tablet 100 mg 100 mg    06/28/13 1117 Given   doxycycline (VIBRA-TABS) tablet 100 mg 100 mg    06/28/13 1118 Given   levofloxacin (LEVAQUIN) tablet 500 mg 500 mg       Subjective: No new complaints  Objective: Temp:  [97.7 F (36.5 C)-98.6 F (37 C)] 98.1 F (36.7 C) (01/21 1005) Pulse Rate:  [78-97] 90 (01/21 1005) Resp:  [18-20] 18 (01/21 1005) BP: (106-129)/(63-75) 120/64 mmHg (01/21 1005) SpO2:  [96 %-97 %] 96 % (01/21 1005)  General: awake, alert, nad Skin: no rashes Lungs: CTA GU: edema of scrotum about the same with some increased edema of penis.  Erythema more faint.  Some edematous changes of right scrotum  Lab Results Lab Results  Component Value Date   WBC 21.0* 06/28/2013   HGB 12.6* 06/28/2013   HCT 36.2* 06/28/2013   MCV 88.1 06/28/2013   PLT 271 06/28/2013    Lab Results  Component Value Date   CREATININE 0.77 06/28/2013   BUN 20 06/28/2013   NA 130* 06/28/2013   K 4.4 06/28/2013   CL 95* 06/28/2013   CO2 20 06/28/2013    Lab Results  Component Value Date   ALT 97* 06/24/2013   AST 72* 06/24/2013   ALKPHOS 81 06/24/2013   BILITOT 0.5 06/24/2013      Microbiology: No results found for this or any previous visit (from the past 240 hour(s)).  Studies/Results: US Scrotum  06/26/2013   CLINICAL DATA:  Right scrotal swelling, redness and pain.  EXAM: SCROTAL ULTRASOUND  DOPPLER ULTRASOUND OF THE TESTICLES  TECHNIQUE: Complete ultrasound examination of the testicles, epididymis, and other scrotal structures was performed. Color and  spectral Doppler ultrasound were also utilized to evaluate blood flow to the testicles.  COMPARISON:  None.  FINDINGS: Right testicle  Measurements: 4 cm x 2.1 cm x 4.7 cm. No mass or microlithiasis visualized.  Left testicle  Measurements: 4.7 cm x 2.6 cm x 3.3 cm. No mass or microlithiasis visualized.  Right epididymis: Mildly enlarged with increased vascularity mostly to the tail. No mass.  Left epididymis: Normal in size in echogenicity. No increased blood flow.  Hydrocele:  None visualized.  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  Pulsed Doppler interrogation of both testes demonstrates there is right scrotal skin thickening with hyperemia surrounding the right testicle within the deep layers of the scrotum.  IMPRESSION: 1. Findings support right epididymitis as well as edema or possibly cellulitis of the right scrotum. No hydroceles. 2. 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 3. Normal testicles.  No testicular mass or torsion   Electronically Signed   By: Lajean Manes M.D.   On: 06/26/2013 16:30   [place] Art/ven Flow Abd Pelv Doppler  06/26/2013   CLINICAL DATA:  Right scrotal swelling, redness and pain.  EXAM: SCROTAL ULTRASOUND  DOPPLER ULTRASOUND OF THE TESTICLES  TECHNIQUE: Complete ultrasound examination of the testicles, epididymis, and other scrotal structures was performed. Color and spectral Doppler ultrasound were also utilized  to evaluate blood flow to the testicles.  COMPARISON:  None.  FINDINGS: Right testicle  Measurements: 4 cm x 2.1 cm x 4.7 cm. No mass or microlithiasis visualized.  Left testicle  Measurements: 4.7 cm x 2.6 cm x 3.3 cm. No mass or microlithiasis visualized.  Right epididymis: Mildly enlarged with increased vascularity mostly to the tail. No mass.  Left epididymis: Normal in size in echogenicity. No increased blood flow.  Hydrocele:  None visualized.  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  Pulsed Doppler interrogation of both testes  demonstrates there is right scrotal skin thickening with hyperemia surrounding the right testicle within the deep layers of the scrotum.  IMPRESSION: 1. Findings support right epididymitis as well as edema or possibly cellulitis of the right scrotum. No hydroceles. 2. 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 3. Normal testicles.  No testicular mass or torsion   Electronically Signed   By: Lajean Manes M.D.   On: 06/26/2013 16:30    Assessment/Plan: 1)  epididimytis - now on levaquin, WBC has leveled off, clinically is a bit improved with less warmth and erythema.  Hopefully will start to turn the corner by tomorrow.    Scharlene Gloss, [facility] [place] [email or web address] [phone] pager   [phone] cell 06/28/2013, 1:50 PM

## 2013-06-28 NOTE — Op Note (Signed)
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 Marlou Starks in incision and drainage of inguinal abscess) Surgeon: Dr. Nicki Reaper [place] Ingrum Asst.: Dr. Marlou Starks  The patient has the above diagnoses and consented the above procedure. The physical findings were noted on the chart and worsened over number of hours. I agree with Dr. Marlou Starks and the patient consented the above procedure. I spoke to pharmacy and Zosyn and linezolid were ordered at appropriate dosages.  Extra care was taken with leg positioning to minimize the risk of compartment syndrome and neuropathy and deep vein thrombosis. Dr. Marlou Starks drain the inguinal abscess and will be dictated elsewhere.  With a marking pen I marked the midline raphae appropriately in the swollen scrotum. Surprisingly the inferior aspect of the scrotum near the perineum felt normal. There was more swelling on the right than the left. The superficial skin was bleeding and a little bit necrotic. The incision was opened and the scrotum was easily bivalved finger dissection with the watery subcutaneous tissue noted. There is no foul smelling. I could feel the testicle bilaterally. I could feel the Foley catheter the midline.  I developed an appropriate thick flaps bilaterally leaving approximately a centimeter a half of subcutaneous tissue and skin and mobilizing this flap from the underlying testicle and surrounding cord. The tissue was diffusely watery and only a minimal amount was removed with cautery and Metzenbaum scissors. Surprisingly on the right side the patient had almost a necrotic tunica vaginalis and most of this was removed sharp dissection with no bleeding. The testicle itself looked healthy.  The same procedure was done the left side. The  tunica vaginalis was opened and testicle was completely normal. The tunica vaginalis was closed with interrupted 3-0 chromic interrupted sutures. Again I developed planes a did not remove skin hoping that it would demarcate and be salvageable. There was minimal bleeding. There was mild penile edema. There is no perineal infection. The only perineal induration was opened with Dr. Marlou Starks surgery  Pulsavac with irrigation was utilized. Saline soaked packing was utilized followed by dry dressing. Mesh pants were used. Patient will go to ICU for observation and for postoperative care.

## 2013-06-28 NOTE — Progress Notes (Signed)
Phoned by Dr Marlou Starks Re-examined patient with DR. Toth Inguinal area now draining pus especially near perineum Scrotum ? Bit worse/same as earlier today Dr Marlou Starks concerned re Forniere's and we both agreed to I and D  Spoke to patient in detail Consented for I and D and likelihood of more than one incision Possibility of orchiectomy discussed After a thorough review of the management options for the patient's condition the patient  elected to proceed with surgical therapy as noted above. We have discussed the potential benefits and risks of the procedure, side effects of the proposed treatment, the likelihood of the patient achieving the goals of the procedure, and any potential problems that might occur during the procedure or recuperation. Informed consent has been obtained.

## 2013-06-28 NOTE — Anesthesia Postprocedure Evaluation (Signed)
  Anesthesia Post-op Note  Patient: Charles Manning  Procedure(s) Performed: Procedure(s) (LRB): INCISION AND DRAINAGE SCROTAL ABSCESS (N/A)  Patient Location: PACU  Anesthesia Type: General  Level of Consciousness: awake and alert   Airway and Oxygen Therapy: Patient Spontanous Breathing  Post-op Pain: mild  Post-op Assessment: Post-op Vital signs reviewed, Patient's Cardiovascular Status Stable, Respiratory Function Stable, Patent Airway and No signs of Nausea or vomiting  Last Vitals:  Filed Vitals:   06/28/13 2200  BP: 138/93  Pulse:   Temp: 37.6 C  Resp:     Post-op Vital Signs: stable   Complications: No apparent anesthesia complications

## 2013-06-28 NOTE — Anesthesia Postprocedure Evaluation (Signed)
  Anesthesia Post-op Note  Patient: Charles Manning  Procedure(s) Performed: Procedure(s): INCISION AND DRAINAGE SCROTAL ABSCESS (N/A)  Patient Location: PACU  Anesthesia Type:General  Level of Consciousness: awake, alert  and oriented  Airway and Oxygen Therapy: Patient Spontanous Breathing and Patient connected to face mask oxygen  Post-op Pain: mild  Post-op Assessment: Post-op Vital signs reviewed, Patient's Cardiovascular Status Stable, Respiratory Function Stable, Patent Airway and No signs of Nausea or vomiting  Post-op Vital Signs: Reviewed and stable  Complications: No apparent anesthesia complications

## 2013-06-28 NOTE — Op Note (Signed)
06/26/2013 - 06/28/2013  8:37 PM  PATIENT:  Charles Manning  58 y.o. male  PRE-OPERATIVE DIAGNOSIS:  Scrotal Abscess, Right Groin abscess  POST-OPERATIVE DIAGNOSIS:  Scrotal Abscess, Right Groin abscess  PROCEDURE:  Procedure(s): INCISION AND DRAINAGE SCROTAL ABSCESS (N/A)  SURGEON:  Surgeon(s) and Role:    * Reece Packer, MD - Primary    * Merrie Roof, MD - Assisting  PHYSICIAN ASSISTANT:   ASSISTANTS: Dr. Matilde Sprang   ANESTHESIA:   general  EBL:     BLOOD ADMINISTERED:none  DRAINS: none   LOCAL MEDICATIONS USED:  NONE  SPECIMEN:  No Specimen  DISPOSITION OF SPECIMEN:  N/A  COUNTS:  YES  TOURNIQUET:  * No tourniquets in log *  DICTATION: .Dragon Dictation After informed consent was obtained the patient was brought to the operating room and placed in the supine position on the operating room table. After adequate induction of general anesthesia the patient was moved in the lithotomy position and all pressure points are padded. The patient's perineum scrotum and groin areas were prepped with Betadine and draped in usual sterile manner. Near the base of the scrotum on the right in the groin area there was an open area in the skin that was draining purulent material. This was probed with a hemostat and the cavity was then opened on top of the hemostat sharply with the electrocautery. A large amount of purulent material was evacuated. Cultures were obtained. The tissue planes were probed bluntly with finger dissection until the tissue planes no longer gave way freely. Hemostasis was achieved using the Bovie electrocautery. The infection appeared to extend up into the scrotum and that portion of the case will be dictated separately by the urologist. Once all the tissue planes were opened and the wound was cleaned up the wound was then packed with moistened Kerlix gauze. Sterile dressings were applied. The patient tolerated the procedure well. At the end of the case all  needle sponge and instrument counts were correct. The patient was then awakened and taken to recovery in stable condition  PLAN OF CARE: Admit to inpatient   PATIENT DISPOSITION:  PACU - hemodynamically stable.   Delay start of Pharmacological VTE agent (>24hrs) due to surgical blood loss or risk of bleeding: yes

## 2013-06-28 NOTE — Anesthesia Preprocedure Evaluation (Addendum)
Anesthesia Evaluation  Patient identified by MRN, date of birth, ID band Patient awake    Reviewed: Allergy & Precautions, H&P , NPO status , Patient's Chart, lab work & pertinent test results  Airway Mallampati: II TM Distance: >3 FB Neck ROM: full    Dental no notable dental hx. (+) Teeth Intact and Dental Advisory Given   Pulmonary Current Smoker,  breath sounds clear to auscultation  Pulmonary exam normal       Cardiovascular Exercise Tolerance: Good hypertension, Pt. on medications Rhythm:regular Rate:Normal     Neuro/Psych Anxiety Depression negative neurological ROS  negative psych ROS   GI/Hepatic negative GI ROS, Neg liver ROS, (+)     substance abuse  IV drug use,   Endo/Other  diabetes, Poorly Controlled, Type 2, Insulin Dependent  Renal/GU negative Renal ROS  negative genitourinary   Musculoskeletal   Abdominal   Peds  Hematology negative hematology ROS (+)   Anesthesia Other Findings   Reproductive/Obstetrics negative OB ROS                          Anesthesia Physical Anesthesia Plan  ASA: III and emergent  Anesthesia Plan: General   Post-op Pain Management:    Induction: Intravenous, Rapid sequence and Cricoid pressure planned  Airway Management Planned: Oral ETT  Additional Equipment:   Intra-op Plan:   Post-operative Plan: Extubation in OR  Informed Consent: I have reviewed the patients History and Physical, chart, labs and discussed the procedure including the risks, benefits and alternatives for the proposed anesthesia with the patient or authorized representative who has indicated his/her understanding and acceptance.   Dental Advisory Given  Plan Discussed with: CRNA and Surgeon  Anesthesia Plan Comments:         Anesthesia Quick Evaluation

## 2013-06-28 NOTE — Consult Note (Signed)
Reason for Consult:Dr. Marylu Lund Referring Physician:  Groin abscess  Charles Manning is an 58 y.o. male.  HPI: Pt was admitted to [facility] on 06/26/13 with 2 days of redness and swelling in groin.  He attributed discomfort initially to his chronic pain from groin dissection/melanoma 2005.  Erythema has progressed to right perineum between thigh and scrotum.  He has significant scrotal swelling, edema and some scrotal necrosis, some penile swelling by no erythema of the penis.  He has been seen by Dr. Junious Silk  With a diagnosis of epididymo-orchitis - wbc and edema increased.  He is also being followed by ID with new changes in his antibiotics because he was not improving.  He has a good deal of swelling, some erythema and drainage from the groin-perineal area.  This is where he had prior groin dissection. The first picture shows an over view of the site.     This picture show right perineal swelling erythema;  there is some drainage between the scrotum, thigh, in this area. The raised blistered area is new today. We were ask to see.     Past Medical History  Diagnosis Date  . HTN (hypertension) 09/25/2011  . DM (diabetes mellitus)  Poor control  09/25/2011  . Degenerative joint disease left shoulder especially 09/25/2011  . Depression 09/25/2011  . Anxiety 09/25/2011  . Chronic pain syndrome both legs and groin on right. 09/25/2011  . Basal cell cancer 09/25/2011    Mid back 2007  . Melanoma 09/25/2011    Right thigh 2005    Past Surgical History  Procedure Laterality Date  . Tumor removal from right thigh    . Lymph node removal      Family History  Problem Relation Age of Onset  . Aneurysm Mother   . Cancer Father     Social History:  reports that he has been smoking Cigarettes.  He has been smoking about 0.50 packs per day. He has never used smokeless tobacco. He reports that he drinks alcohol. He reports that he does not use illicit drugs. Tobacco:  1PPD 40 + years Drugs:  Prior  hx of Mj and cocaine. ETOH:  Social Disabled roofer Single  Allergies:  Allergies  Allergen Reactions  . Vancomycin Itching and Rash    Medications:  Prior to Admission:  Prescriptions prior to admission  Medication Sig Dispense Refill  . acetaminophen (TYLENOL) 500 MG tablet Take 1,000 mg by mouth every 6 (six) hours as needed for pain.       . citalopram (CELEXA) 20 MG tablet Take 1 tablet (20 mg total) by mouth daily.  30 tablet  0  . fenofibrate 160 MG tablet Take 160 mg by mouth daily.      . insulin aspart protamine-insulin aspart (NOVOLOG 70/30) (70-30) 100 UNIT/ML injection Inject 60 Units into the skin 2 (two) times daily with a meal.       . lisinopril (PRINIVIL,ZESTRIL) 10 MG tablet Take 10 mg by mouth daily.      . meloxicam (MOBIC) 15 MG tablet Take 15 mg by mouth daily.      . QUEtiapine Fumarate (SEROQUEL XR) 150 MG 24 hr tablet Take 150 mg by mouth at bedtime.       Scheduled: . citalopram  20 mg Oral Daily  . doxycycline  100 mg Oral Q12H  . enoxaparin (LOVENOX) injection  40 mg Subcutaneous Q24H  . fenofibrate  160 mg Oral Daily  . insulin aspart  0-15 Units Subcutaneous  TID WC  . insulin aspart  4 Units Subcutaneous TID WC  . insulin glargine  40 Units Subcutaneous Daily  . levofloxacin  500 mg Oral Daily  . lisinopril  10 mg Oral Daily  . nicotine  21 mg Transdermal Daily  . QUEtiapine Fumarate  150 mg Oral QHS   Continuous: . sodium chloride 100 mL/hr at 06/28/13 0458  . dextrose 5 % and 0.45% NaCl 100 mL/hr (06/27/13 0945)   YNW:GNFAOZHYQMVHQ, acetaminophen, dextrose, HYDROmorphone (DILAUDID) injection, ketorolac, ondansetron (ZOFRAN) IV Anti-infectives   Start     Dose/Rate Route Frequency Ordered Stop   06/28/13 1000  levofloxacin (LEVAQUIN) tablet 500 mg     500 mg Oral Daily 06/27/13 1405     06/27/13 1200  levofloxacin (LEVAQUIN) IVPB 750 mg  Status:  Discontinued     750 mg 100 mL/hr over 90 Minutes Intravenous Every 24 hours 06/27/13 1055  06/27/13 1405   06/27/13 0015  doxycycline (VIBRA-TABS) tablet 100 mg     100 mg Oral Every 12 hours 06/26/13 2354     06/26/13 2200  clindamycin (CLEOCIN) IVPB 300 mg  Status:  Discontinued     300 mg 100 mL/hr over 30 Minutes Intravenous 3 times per day 06/26/13 2100 06/27/13 1400   06/26/13 2115  cefTRIAXone (ROCEPHIN) 1 g in dextrose 5 % 50 mL IVPB     1 g 100 mL/hr over 30 Minutes Intravenous  Once 06/26/13 2100 06/26/13 2150   06/26/13 1500  clindamycin (CLEOCIN) IVPB 600 mg     600 mg 100 mL/hr over 30 Minutes Intravenous  Once 06/26/13 1437 06/26/13 1543      Results for orders placed during the hospital encounter of 06/26/13 (from the past 48 hour(s))  GLUCOSE, CAPILLARY     Status: Abnormal   Collection Time    06/26/13  4:48 PM      Result Value Range   Glucose-Capillary 387 (*) 70 - 99 mg/dL  GLUCOSE, CAPILLARY     Status: Abnormal   Collection Time    06/26/13 10:32 PM      Result Value Range   Glucose-Capillary >600 (*) 70 - 99 mg/dL  GLUCOSE, CAPILLARY     Status: Abnormal   Collection Time    06/26/13 10:54 PM      Result Value Range   Glucose-Capillary 447 (*) 70 - 99 mg/dL  BASIC METABOLIC PANEL     Status: Abnormal   Collection Time    06/26/13 11:30 PM      Result Value Range   Sodium 127 (*) 137 - 147 mEq/L   Potassium 4.8  3.7 - 5.3 mEq/L   Chloride 92 (*) 96 - 112 mEq/L   CO2 20  19 - 32 mEq/L   Glucose, Bld 392 (*) 70 - 99 mg/dL   BUN 21  6 - 23 mg/dL   Creatinine, Ser 0.85  0.50 - 1.35 mg/dL   Calcium 8.6  8.4 - 10.5 mg/dL   GFR calc non Af Amer >90  >90 mL/min   GFR calc Af Amer >90  >90 mL/min   Comment: (NOTE)     The eGFR has been calculated using the CKD EPI equation.     This calculation has not been validated in all clinical situations.     eGFR's persistently <90 mL/min signify possible Chronic Kidney     Disease.  GLUCOSE, CAPILLARY     Status: Abnormal   Collection Time    06/27/13  1:14 AM  Result Value Range    Glucose-Capillary 365 (*) 70 - 99 mg/dL  GLUCOSE, CAPILLARY     Status: Abnormal   Collection Time    06/27/13  2:08 AM      Result Value Range   Glucose-Capillary 378 (*) 70 - 99 mg/dL  GLUCOSE, CAPILLARY     Status: Abnormal   Collection Time    06/27/13  3:16 AM      Result Value Range   Glucose-Capillary 358 (*) 70 - 99 mg/dL   Comment 1 Notify RN    BASIC METABOLIC PANEL     Status: Abnormal   Collection Time    06/27/13  3:51 AM      Result Value Range   Sodium 128 (*) 137 - 147 mEq/L   Potassium 3.9  3.7 - 5.3 mEq/L   Comment: DELTA CHECK NOTED   Chloride 97  96 - 112 mEq/L   CO2 20  19 - 32 mEq/L   Glucose, Bld 282 (*) 70 - 99 mg/dL   BUN 17  6 - 23 mg/dL   Creatinine, Ser 0.76  0.50 - 1.35 mg/dL   Calcium 7.8 (*) 8.4 - 10.5 mg/dL   GFR calc non Af Amer >90  >90 mL/min   GFR calc Af Amer >90  >90 mL/min   Comment: (NOTE)     The eGFR has been calculated using the CKD EPI equation.     This calculation has not been validated in all clinical situations.     eGFR's persistently <90 mL/min signify possible Chronic Kidney     Disease.  HEMOGLOBIN A1C     Status: Abnormal   Collection Time    06/27/13  3:57 AM      Result Value Range   Hemoglobin A1C 14.7 (*) <5.7 %   Comment: (NOTE)                                                                               According to the ADA Clinical Practice Recommendations for 2011, when     HbA1c is used as a screening test:      >=6.5%   Diagnostic of Diabetes Mellitus               (if abnormal result is confirmed)     5.7-6.4%   Increased risk of developing Diabetes Mellitus     References:Diagnosis and Classification of Diabetes Mellitus,Diabetes     QIWL,7989,21(JHERD 1):S62-S69 and Standards of Medical Care in             Diabetes - 2011,Diabetes Care,2011,34 (Suppl 1):S11-S61.   Mean Plasma Glucose 375 (*) <117 mg/dL   Comment: Performed at [facility]  CBC     Status: Abnormal   Collection Time     06/27/13  3:57 AM      Result Value Range   WBC 22.3 (*) 4.0 - 10.5 K/uL   RBC 4.24  4.22 - 5.81 MIL/uL   Hemoglobin 13.0  13.0 - 17.0 g/dL   HCT 37.3 (*) 39.0 - 52.0 %   MCV 88.0  78.0 - 100.0 fL   MCH 30.7  26.0 - 34.0 pg   MCHC 34.9  30.0 - 36.0 g/dL   RDW 12.1  11.5 - 15.5 %   Platelets 253  150 - 400 K/uL  GLUCOSE, CAPILLARY     Status: Abnormal   Collection Time    06/27/13  4:07 AM      Result Value Range   Glucose-Capillary 263 (*) 70 - 99 mg/dL  GLUCOSE, CAPILLARY     Status: Abnormal   Collection Time    06/27/13  4:33 AM      Result Value Range   Glucose-Capillary 217 (*) 70 - 99 mg/dL  GLUCOSE, CAPILLARY     Status: Abnormal   Collection Time    06/27/13  5:21 AM      Result Value Range   Glucose-Capillary 232 (*) 70 - 99 mg/dL   Comment 1 Notify RN    GLUCOSE, CAPILLARY     Status: Abnormal   Collection Time    06/27/13  6:26 AM      Result Value Range   Glucose-Capillary 142 (*) 70 - 99 mg/dL  GLUCOSE, CAPILLARY     Status: Abnormal   Collection Time    06/27/13  7:53 AM      Result Value Range   Glucose-Capillary 148 (*) 70 - 99 mg/dL  GLUCOSE, CAPILLARY     Status: Abnormal   Collection Time    06/27/13 11:52 AM      Result Value Range   Glucose-Capillary 249 (*) 70 - 99 mg/dL   Comment 1 Notify RN    GLUCOSE, CAPILLARY     Status: Abnormal   Collection Time    06/27/13  4:55 PM      Result Value Range   Glucose-Capillary 299 (*) 70 - 99 mg/dL   Comment 1 Notify RN    GLUCOSE, CAPILLARY     Status: Abnormal   Collection Time    06/27/13 10:46 PM      Result Value Range   Glucose-Capillary 295 (*) 70 - 99 mg/dL   Comment 1 Documented in Chart     Comment 2 Notify RN    CBC WITH DIFFERENTIAL     Status: Abnormal   Collection Time    06/28/13  5:55 AM      Result Value Range   WBC 21.0 (*) 4.0 - 10.5 K/uL   RBC 4.11 (*) 4.22 - 5.81 MIL/uL   Hemoglobin 12.6 (*) 13.0 - 17.0 g/dL   HCT 36.2 (*) 39.0 - 52.0 %   MCV 88.1  78.0 - 100.0 fL   MCH  30.7  26.0 - 34.0 pg   MCHC 34.8  30.0 - 36.0 g/dL   RDW 12.1  11.5 - 15.5 %   Platelets 271  150 - 400 K/uL   Neutrophils Relative % 80 (*) 43 - 77 %   Neutro Abs 16.8 (*) 1.7 - 7.7 K/uL   Lymphocytes Relative 10 (*) 12 - 46 %   Lymphs Abs 2.0  0.7 - 4.0 K/uL   Monocytes Relative 9  3 - 12 %   Monocytes Absolute 1.9 (*) 0.1 - 1.0 K/uL   Eosinophils Relative 1  0 - 5 %   Eosinophils Absolute 0.2  0.0 - 0.7 K/uL   Basophils Relative 0  0 - 1 %   Basophils Absolute 0.0  0.0 - 0.1 K/uL  BASIC METABOLIC PANEL     Status: Abnormal   Collection Time    06/28/13  5:55 AM      Result Value Range  Sodium 130 (*) 137 - 147 mEq/L   Potassium 4.4  3.7 - 5.3 mEq/L   Chloride 95 (*) 96 - 112 mEq/L   CO2 20  19 - 32 mEq/L   Glucose, Bld 310 (*) 70 - 99 mg/dL   BUN 20  6 - 23 mg/dL   Creatinine, Ser 0.77  0.50 - 1.35 mg/dL   Calcium 8.2 (*) 8.4 - 10.5 mg/dL   GFR calc non Af Amer >90  >90 mL/min   GFR calc Af Amer >90  >90 mL/min   Comment: (NOTE)     The eGFR has been calculated using the CKD EPI equation.     This calculation has not been validated in all clinical situations.     eGFR's persistently <90 mL/min signify possible Chronic Kidney     Disease.  GLUCOSE, CAPILLARY     Status: Abnormal   Collection Time    06/28/13  7:27 AM      Result Value Range   Glucose-Capillary 285 (*) 70 - 99 mg/dL   Comment 1 Notify RN    GLUCOSE, CAPILLARY     Status: Abnormal   Collection Time    06/28/13 11:15 AM      Result Value Range   Glucose-Capillary 264 (*) 70 - 99 mg/dL   Comment 1 Notify RN      No results found.  Review of Systems  Constitutional: Negative for fever, chills, weight loss, malaise/fatigue and diaphoresis.  HENT: Negative.   Eyes:       Trouble reading small print cannot afford new eye exam or glasses.  Respiratory: Negative.   Cardiovascular: Negative.   Gastrointestinal: Positive for heartburn (occasional). Negative for nausea, vomiting, abdominal pain,  diarrhea, constipation, blood in stool and melena.  Genitourinary: Negative.        Some pain voiding since this swelling started.  Musculoskeletal:       Chronic lower leg swelling right greater than the left.  Skin:       Symptoms started about 2 days before admit.  Neurological: Negative.  Negative for weakness.  Endo/Heme/Allergies: Negative.   Psychiatric/Behavioral: Positive for depression. The patient is nervous/anxious.        Controlled with medications.   Blood pressure 144/88, pulse 82, temperature 98.5 F (36.9 C), temperature source Oral, resp. rate 18, height '5\' 9"'  (1.753 m), weight 88.3 kg (194 lb 10.7 oz), SpO2 99.00%. Physical Exam  Constitutional: He is oriented to person, place, and time. He appears well-developed and well-nourished. No distress.  HENT:  Head: Normocephalic and atraumatic.  Nose: Nose normal.  Eyes: Conjunctivae are normal. Pupils are equal, round, and reactive to light. Right eye exhibits no discharge. Left eye exhibits no discharge.  Neck: Normal range of motion. Neck supple. No JVD present. No tracheal deviation present. No thyromegaly present.  Cardiovascular: Normal rate, regular rhythm, normal heart sounds and intact distal pulses.   No murmur heard. Respiratory: Effort normal and breath sounds normal. No respiratory distress. He has no wheezes. He has no rales. He exhibits no tenderness.  GI: Soft. Bowel sounds are normal. He exhibits no distension and no mass. There is no tenderness. There is no rebound and no guarding.  Genitourinary:  Penis is swollen with some edema.  Musculoskeletal:  Trace edema lower legs  Lymphadenopathy:    He has no cervical adenopathy.  Neurological: He is alert and oriented to person, place, and time. No cranial nerve deficit.  Skin: Skin is warm. He is not  diaphoretic. No erythema.  See pictures above.  He has marked swelling of the scrotum, erythema right perineum, with some drainage between his thigh,  perineium and scrotum. Marked swelling right lower perineum.  See pictures above.  He has some skin necrosis right scrotum.    Assessment/Plan: 1.Cellulits-epididimytis-orchitis with groin-perineal cellulitis.  He has had 1 day of ceftriaxone, clindamycin, and 3 days of  doxycycline.  Now on Levofloxacin and doxycycline. 2.  Hx of right thigh dissection and melanoma resection 2005. 3.  Basal Cell Ca mid back resection 2007. 4.  AODM with poor control 5.  Tobacco use  40 years 6.  Chronic back and lower leg pain 7.  Dyslipidemia 8.  Anxiety and depression 9.  DJD  PLAN:  Continue antibiotics, keep NPO after MN and look at this in the AM with Dr. Dalbert Batman.  Decide if there is something to drain.  I don't see any specific abscess, but he has allot of tissue that is borderline.  I will ask nursing to clean this area and place abd into right groin perineum to help keep clean and dry.  Nashanti Duquette 06/28/2013, 4:40 PM

## 2013-06-28 NOTE — Progress Notes (Signed)
Increased swelling of right hemiscrotum, enlargement of right testicle - scrotal edema appears simple, no crepitus or necrosis. No fluctuance. Erythema tracks superior to inguinal area and laterally toward perineum. These areas are also indurated, but again no drainage, fluctuance, crepitus or necrosis.  Imp/plan: - epididymo-orchitis - wbc and edema increased, no fever, tachycardia or hypotension. Still no obvious surgical role - debridement or drainage. Cont supportive care and broad spectrum abx.  The process does not appear to involve thigh or his prior incision for melanoma, but its unkown how extensive the inguinal lymph node dissection might have been and if this is contributing to the induration/edema.   Diabetes mellitus type 2 with complications, uncontrolled  Anxiety  Chronic pain syndrome  Cellulitis of groin, right  Cellulitis  Epididymitis Charles Manning is a 58 y.o. male with history of melenoma in 2005 who was doing well until last Friday noted a lump around the old incision site. Then developed scrotal swelling, redness. Came in to ED and [place] with epididymitis. Cellulisiti of scrotal area. Started on doxy and clindamycin but WBC has not improved and scrotum has not improved. Has not been hypotensive, not febrile but with subjective fever and chills at home. No new sexual partners. REcently saw girlfriend. No recent penile discharge or dysuria.

## 2013-06-28 NOTE — Progress Notes (Signed)
Inpatient Diabetes Program Recommendations  AACE/ADA: New Consensus Statement on Inpatient Glycemic Control (2013)  Target Ranges:  Prepandial:   less than 140 mg/dL      Peak postprandial:   less than 180 mg/dL (1-2 hours)      Critically ill patients:  140 - 180 mg/dL   Reason for Visit: Results for AIDRIC, ENDICOTT (MRN [id number]) as of 06/28/2013 12:25  Ref. Range 06/27/2013 11:52 06/27/2013 16:55 06/27/2013 22:46 06/28/2013 07:27 06/28/2013 11:15  Glucose-Capillary Latest Range: 70-99 mg/dL 249 (H) 299 (H) 295 (H) 285 (H) 264 (H)   Note that patient was taking Novolog 70/30 60 units bid at home prior to admission.  Please increase Lantus to 60 units daily and increase Novolog meal coverage to 8 units tid with meals.   At discharge, consider resumption of Novolog 70/30 (insulin pens) 60 units bid. Needs close follow-up with PCP.  Also will give information [facility]  Please see note from 06/27/13 from Diabetes Coordinator also regarding outpatient goals and self-care behaviors.  Adah Perl, RN, BC-ADM Inpatient Diabetes Coordinator Pager [phone]

## 2013-06-29 ENCOUNTER — Other Ambulatory Visit: Payer: Self-pay | Admitting: Urology

## 2013-06-29 LAB — GLUCOSE, CAPILLARY
GLUCOSE-CAPILLARY: 296 mg/dL — AB (ref 70–99)
Glucose-Capillary: 250 mg/dL — ABNORMAL HIGH (ref 70–99)
Glucose-Capillary: 166 mg/dL — ABNORMAL HIGH (ref 70–99)
Glucose-Capillary: 94 mg/dL (ref 70–99)
Glucose-Capillary: 83 mg/dL (ref 70–99)

## 2013-06-29 LAB — CBC
HCT: 37.4 % — ABNORMAL LOW (ref 39.0–52.0)
HEMOGLOBIN: 12.9 g/dL — AB (ref 13.0–17.0)
MCH: 30.5 pg (ref 26.0–34.0)
MCHC: 34.5 g/dL (ref 30.0–36.0)
MCV: 88.4 fL (ref 78.0–100.0)
PLATELETS: 318 10*3/uL (ref 150–400)
RBC: 4.23 MIL/uL (ref 4.22–5.81)
RDW: 12.1 % (ref 11.5–15.5)
WBC: 21.3 10*3/uL — ABNORMAL HIGH (ref 4.0–10.5)

## 2013-06-29 LAB — BASIC METABOLIC PANEL
BUN: 22 mg/dL (ref 6–23)
CO2: 22 meq/L (ref 19–32)
Calcium: 7.9 mg/dL — ABNORMAL LOW (ref 8.4–10.5)
Chloride: 97 mEq/L (ref 96–112)
Creatinine, Ser: 1.01 mg/dL (ref 0.50–1.35)
GFR calc Af Amer: >90 mL/min (ref >90)
GFR, EST NON AFRICAN AMERICAN: 82 mL/min — AB (ref >90)
GLUCOSE: 300 mg/dL — AB (ref 70–99)
POTASSIUM: 4.8 meq/L (ref 3.7–5.3)
SODIUM: 130 meq/L — AB (ref 137–147)

## 2013-06-29 LAB — GC/CHLAMYDIA PROBE AMP
CT Probe RNA: NEGATIVE
GC Probe RNA: NEGATIVE

## 2013-06-29 MED ORDER — DIPHENHYDRAMINE HCL 50 MG/ML IJ SOLN
25.0000 mg | Freq: Three times a day (TID) | INTRAMUSCULAR | Status: DC
  Administered 2013-06-29 – 2013-06-30 (×4): 50 mg via INTRAVENOUS
  Administered 2013-07-01: 25 mg via INTRAVENOUS
  Administered 2013-07-01: 50 mg via INTRAVENOUS
  Administered 2013-07-04 – 2013-07-05 (×3): 25 mg via INTRAVENOUS
  Filled 2013-06-29 (×30): qty 1

## 2013-06-29 MED ORDER — VANCOMYCIN HCL IN DEXTROSE 1-5 GM/200ML-% IV SOLN
1000.0000 mg | Freq: Three times a day (TID) | INTRAVENOUS | Status: DC
  Administered 2013-06-29 – 2013-06-30 (×3): 1000 mg via INTRAVENOUS
  Filled 2013-06-29 (×5): qty 200

## 2013-06-29 NOTE — Plan of Care (Signed)
Problem: Diagnosis - Type of Surgery Goal: General Surgical Patient Education (See Patient Education module for education specifics) S/P I&D of scrotal abcess and right groin abcess

## 2013-06-29 NOTE — Progress Notes (Signed)
ANTIBIOTIC CONSULT NOTE - INITIAL  Pharmacy Consult for Vancomycin Indication: cellulitis with abscess  Allergies  Allergen Reactions  . Vancomycin Itching and Rash    Patient Measurements: Height: 5\' 10"  (177.8 cm) Weight: 212 lb 1.3 oz (96.2 kg) IBW/kg (Calculated) : 73  Vital Signs: Temp: 98.5 F (36.9 C) (01/22 0800) Temp src: Oral (01/22 0800) BP: 113/72 mmHg (01/22 1200) Pulse Rate: 74 (01/22 1200) Intake/Output from previous day: 01/21 0701 - 01/22 0700 In: 2250 [P.O.:480; I.V.:1720; IV Piggyback:50] Out: 0938 [Urine:1575] Intake/Output from this shift: Total I/O In: 4292.5 [P.O.:480; I.V.:3200; IV Piggyback:612.5] Out: 135 [Urine:135]  Labs:  Recent Labs  06/27/13 0351 06/27/13 0357 06/28/13 0555 06/29/13 0335  WBC  --  22.3* 21.0* 21.3*  HGB  --  13.0 12.6* 12.9*  PLT  --  253 271 318  CREATININE 0.76  --  0.77 1.01   Estimated Creatinine Clearance: 96.2 ml/min (by C-G formula based on Cr of 1.01). No results found for this basename: VANCOTROUGH, Corlis Leak, VANCORANDOM, [place], [place], GENTRANDOM, [place], TOBRAPEAK, TOBRARND, AMIKACINPEAK, AMIKACINTROU, AMIKACIN,  in the last 72 hours   Microbiology: Recent Results (from the past 720 hour(s))  GC/CHLAMYDIA PROBE AMP     Status: None   Collection Time    06/28/13  7:17 AM      Result Value Range Status   CT Probe RNA NEGATIVE  NEGATIVE Final   GC Probe RNA NEGATIVE  NEGATIVE Final   Comment: (NOTE)                                                                                               **Normal Reference Range: Negative**          Assay performed using the Gen-Probe APTIMA COMBO2 (R) Assay.     Acceptable specimen types for this assay include APTIMA Swabs (Unisex,     endocervical, urethral, or vaginal), first void urine, and ThinPrep     liquid based cytology samples.     Performed at [facility]     Status: None   Collection Time    06/28/13  7:06  PM      Result Value Range Status   MRSA, PCR NEGATIVE  NEGATIVE Final   Staphylococcus aureus NEGATIVE  NEGATIVE Final   Comment:            The Xpert SA Assay (FDA     approved for NASAL specimens     in patients over 43 years of age),     is one component of     a comprehensive surveillance     program.  Test performance has     been validated by [employer] for patients greater     than or equal to 83 year old.     It is not intended     to diagnose infection nor to     guide or monitor treatment.  CULTURE, ROUTINE-ABSCESS     Status: None   Collection Time    06/28/13  8:11 PM      Result Value Range  Status   Specimen Description ABSCESS SCROTUM   Final   Special Requests PATIENT ON FOLLOWING LEVIQUIN DOXYCYCLINE   Final   Gram Stain     Final   Value: ABUNDANT WBC PRESENT, PREDOMINANTLY PMN     NO SQUAMOUS EPITHELIAL CELLS SEEN     MODERATE GRAM POSITIVE COCCI IN PAIRS     Performed by [facility] Gram Stain Report Called to,Read Back By and Verified With: Gram Stain Report Called to,Read Back By and Verified With:  W ALLRED RN AT 2053 ON 06/28/13 A NAVARRO     Performed at [facility]   Culture PENDING   Incomplete   Report Status PENDING   Incomplete  GRAM STAIN     Status: None   Collection Time    06/28/13  8:11 PM      Result Value Range Status   Specimen Description ABSCESS SCROTUM   Final   Special Requests PATIENT ON FOLLOWING LEVIQUIN DOXYCYCLINE   Final   Gram Stain     Final   Value: NO WBC SEEN     MODERATE GRAM POSITIVE COCCI IN PAIRS     Gram Stain Report Called to,Read Back By and Verified With: Carrolyn Meiers RN 2053 06/28/13 A NAVARRO   Report Status 06/28/2013 FINAL   Final    Medical History: Past Medical History  Diagnosis Date  . HTN (hypertension) 09/25/2011  . DM (diabetes mellitus) 09/25/2011  . Degenerative joint disease 09/25/2011  . Depression 09/25/2011  . Anxiety 09/25/2011  . Chronic pain syndrome 09/25/2011  . Basal  cell cancer 09/25/2011    Mid back 2007  . Melanoma 09/25/2011    Right thigh 2005    Assessment: 57 yoM presented 1/19 with R scrotal swelling, redness and pain. Ultrasound c/w epididimo-orchitis. ID on board, and changed abx to Levaquin, Doxycyline. On 1/21, pt noticed with incr swelling of the R hemiscrotum and enlargement of R testicle worrisome for Forniere's.  I&D performed 1/21.  MD broadened abx to Linezolid and Zosyn since patient had allergy listed to vancomycin for itching.  Patient has since tolerated vancomycin and ID has ordered to change abx to vancomycin today.  By switching antibiotics to vancomycin, able to avoid interaction with Linezolid and patient's home Celexa.  1/19 Ceftriaxone x 1  1/19 >> clindamycin >> 1/20 1/20 >> doxycycline >> 1/21 1/20 >> Levaquin >> 1/21 1/21 >> Linezolid >> 1/22 1/21 >> Zosyn >> 1/22 >> Vancomycin >>   Tmax: 99.7  WBCs: 21.3  Renal: Scr 1.01, CG > 100 ml/min  1/21 abscess culture: moderate GPC in pairs  Goal of Therapy:  Vancomycin trough level 15-20 mcg/ml  Plan:  1.  Vancomycin 1g IV q8h. 2.  F/u SCr, trough level, culture results, ID recommendations, clinical course.  Hershal Coria 06/29/2013,1:05 PM

## 2013-06-29 NOTE — Progress Notes (Signed)
[facility] for Infectious Disease  Date of Admission:  06/26/2013  Antibiotics: Antibiotics Given (last 72 hours)   Date/Time Action Medication Dose Rate   06/27/13 0040 Given   doxycycline (VIBRA-TABS) tablet 100 mg 100 mg    06/27/13 0916 Given   doxycycline (VIBRA-TABS) tablet 100 mg 100 mg    06/27/13 1225 Given   levofloxacin (LEVAQUIN) IVPB 750 mg 750 mg 100 mL/hr   06/27/13 2101 Given   doxycycline (VIBRA-TABS) tablet 100 mg 100 mg    06/28/13 1117 Given   doxycycline (VIBRA-TABS) tablet 100 mg 100 mg    06/28/13 1118 Given   levofloxacin (LEVAQUIN) tablet 500 mg 500 mg    06/28/13 2030 Given   piperacillin-tazobactam (ZOSYN) IVPB 3.375 g 3.375 g    06/28/13 2104 Given  [pharmacy aware/dose received from OR]   linezolid (ZYVOX) IVPB 600 mg 600 mg 300 mL/hr   06/29/13 0431 Given   piperacillin-tazobactam (ZOSYN) IVPB 3.375 g 3.375 g 12.5 mL/hr   06/29/13 1015 Given   linezolid (ZYVOX) IVPB 600 mg 600 mg 300 mL/hr      Subjective: No new complaints  Objective: Temp:  [98.2 F (36.8 C)-102.9 F (39.4 C)] 98.5 F (36.9 C) (01/22 0800) Pulse Rate:  [60-113] 70 (01/22 1000) Resp:  [2-20] 16 (01/22 1000) BP: (95-175)/(60-100) 122/76 mmHg (01/22 1000) SpO2:  [93 %-100 %] 97 % (01/22 1000) FiO2 (%):  [2 %-96 %] 2 % (01/22 0902) Weight:  [212 lb 1.3 oz (96.2 kg)] 212 lb 1.3 oz (96.2 kg) (01/21 2300)  General: awake, alert, nad Skin: no rashes Lungs: CTA GU: wrapped  Lab Results Lab Results  Component Value Date   WBC 21.3* 06/29/2013   HGB 12.9* 06/29/2013   HCT 37.4* 06/29/2013   MCV 88.4 06/29/2013   PLT 318 06/29/2013    Lab Results  Component Value Date   CREATININE 1.01 06/29/2013   BUN 22 06/29/2013   NA 130* 06/29/2013   K 4.8 06/29/2013   CL 97 06/29/2013   CO2 22 06/29/2013    Lab Results  Component Value Date   ALT 97* 06/24/2013   AST 72* 06/24/2013   ALKPHOS 81 06/24/2013   BILITOT 0.5 06/24/2013      Microbiology: Recent Results (from  the past 240 hour(s))  GC/CHLAMYDIA PROBE AMP     Status: None   Collection Time    06/28/13  7:17 AM      Result Value Range Status   CT Probe RNA NEGATIVE  NEGATIVE Final   GC Probe RNA NEGATIVE  NEGATIVE Final   Comment: (NOTE)                                                                                               **Normal Reference Range: Negative**          Assay performed using the Gen-Probe APTIMA COMBO2 (R) Assay.     Acceptable specimen types for this assay include APTIMA Swabs (Unisex,     endocervical, urethral, or vaginal), first void urine, and ThinPrep     liquid based cytology samples.  Performed at [place]     Status: None   Collection Time    06/28/13  7:06 PM      Result Value Range Status   MRSA, PCR NEGATIVE  NEGATIVE Final   Staphylococcus aureus NEGATIVE  NEGATIVE Final   Comment:            The Xpert SA Assay (FDA     approved for NASAL specimens     in patients over 38 years of age),     is one component of     a comprehensive surveillance     program.  Test performance has     been validated by [employer] for patients greater     than or equal to 70 year old.     It is not intended     to diagnose infection nor to     guide or monitor treatment.  CULTURE, ROUTINE-ABSCESS     Status: None   Collection Time    06/28/13  8:11 PM      Result Value Range Status   Specimen Description ABSCESS SCROTUM   Final   Special Requests PATIENT ON FOLLOWING LEVIQUIN DOXYCYCLINE   Final   Gram Stain     Final   Value: ABUNDANT WBC PRESENT, PREDOMINANTLY PMN     NO SQUAMOUS EPITHELIAL CELLS SEEN     MODERATE GRAM POSITIVE COCCI IN PAIRS     Performed by [facility] Gram Stain Report Called to,Read Back By and Verified With: Gram Stain Report Called to,Read Back By and Verified With:  W ALLRED RN AT 2053 ON 06/28/13 A NAVARRO     Performed at [place] PENDING   Incomplete   Report  Status PENDING   Incomplete  GRAM STAIN     Status: None   Collection Time    06/28/13  8:11 PM      Result Value Range Status   Specimen Description ABSCESS SCROTUM   Final   Special Requests PATIENT ON FOLLOWING LEVIQUIN DOXYCYCLINE   Final   Gram Stain     Final   Value: NO WBC SEEN     MODERATE GRAM POSITIVE COCCI IN PAIRS     Gram Stain Report Called to,Read Back By and Verified With: Carrolyn Meiers RN 2053 06/28/13 A NAVARRO   Report Status 06/28/2013 FINAL   Final    Studies/Results: No results found.  Assessment/Plan: 1)  Cellulitis with abscess - POD#1.  Gram stain with GPC, culture negative so far.  Antibiotics changed to linezolid yesterday and zosyn to replace doxycycline and levaquin.  Based on gram stain likely Staph or Strep but will watch culture.  He has vancomycin listed as allergy but has tolerated it before.  Is on Celexa which is relatively contraindicated with linezolid.  I will change him to vancomycin and watch for rash.  Continue with Zosyn and will taper antibiotics based on culture result.    Scharlene Gloss, [place] for Infectious Disease [place] [email or web address] [phone] pager   [phone] cell 06/29/2013, 12:29 PM

## 2013-06-29 NOTE — Progress Notes (Signed)
1 Day Post-Op  Subjective: Arousable. Oriented. Moves slowly in bed. Seems quite stiff.  Underwent debridement of right inguinal area and scrotum last night by Dr. McDiarmid and Dr. Marlou Starks. Tip down to 98.2. Heart rate 67.Hemoglobin 12.9. WBC 21.3. Creatinine 1.01. Glucose 300. On IV Zosyn and linezolid  Objective: Vital signs in last 24 hours: Temp:  [98.1 F (36.7 C)-102.9 F (39.4 C)] 98.2 F (36.8 C) (01/22 0400) Pulse Rate:  [60-113] 67 (01/22 0300) Resp:  [13-20] 14 (01/22 0431) BP: (100-175)/(63-100) 115/80 mmHg (01/22 0300) SpO2:  [93 %-100 %] 97 % (01/22 0431) Weight:  [212 lb 1.3 oz (96.2 kg)] 212 lb 1.3 oz (96.2 kg) (01/21 2300) Last BM Date: 06/26/13  Intake/Output from previous day: 01/21 0701 - 01/22 0700 In: 1900 [P.O.:480; I.V.:1420] Out: 1300 [Urine:1300] Intake/Output this shift: Total I/O In: 1420 [I.V.:1420] Out: 100 [Urine:100]   EXAM: General appearance: sleepy but arousable. Appropriate conversation. Mild distress.  Male genitalia:  Open wound right inguinal area extends down posteriorly packed open. no bleeding skin edges healthy. Large open wound in scrotum packed open. No bleeding.  Lab Results:  Results for orders placed during the hospital encounter of 06/26/13 (from the past 24 hour(s))  CBC WITH DIFFERENTIAL     Status: Abnormal   Collection Time    06/28/13  5:55 AM      Result Value Range   WBC 21.0 (*) 4.0 - 10.5 K/uL   RBC 4.11 (*) 4.22 - 5.81 MIL/uL   Hemoglobin 12.6 (*) 13.0 - 17.0 g/dL   HCT 36.2 (*) 39.0 - 52.0 %   MCV 88.1  78.0 - 100.0 fL   MCH 30.7  26.0 - 34.0 pg   MCHC 34.8  30.0 - 36.0 g/dL   RDW 12.1  11.5 - 15.5 %   Platelets 271  150 - 400 K/uL   Neutrophils Relative % 80 (*) 43 - 77 %   Neutro Abs 16.8 (*) 1.7 - 7.7 K/uL   Lymphocytes Relative 10 (*) 12 - 46 %   Lymphs Abs 2.0  0.7 - 4.0 K/uL   Monocytes Relative 9  3 - 12 %   Monocytes Absolute 1.9 (*) 0.1 - 1.0 K/uL   Eosinophils Relative 1  0 - 5 %   Eosinophils  Absolute 0.2  0.0 - 0.7 K/uL   Basophils Relative 0  0 - 1 %   Basophils Absolute 0.0  0.0 - 0.1 K/uL  BASIC METABOLIC PANEL     Status: Abnormal   Collection Time    06/28/13  5:55 AM      Result Value Range   Sodium 130 (*) 137 - 147 mEq/L   Potassium 4.4  3.7 - 5.3 mEq/L   Chloride 95 (*) 96 - 112 mEq/L   CO2 20  19 - 32 mEq/L   Glucose, Bld 310 (*) 70 - 99 mg/dL   BUN 20  6 - 23 mg/dL   Creatinine, Ser 0.77  0.50 - 1.35 mg/dL   Calcium 8.2 (*) 8.4 - 10.5 mg/dL   GFR calc non Af Amer >90  >90 mL/min   GFR calc Af Amer >90  >90 mL/min  GLUCOSE, CAPILLARY     Status: Abnormal   Collection Time    06/28/13  7:27 AM      Result Value Range   Glucose-Capillary 285 (*) 70 - 99 mg/dL   Comment 1 Notify RN    GLUCOSE, CAPILLARY     Status: Abnormal  Collection Time    06/28/13 11:15 AM      Result Value Range   Glucose-Capillary 264 (*) 70 - 99 mg/dL   Comment 1 Notify RN    GLUCOSE, CAPILLARY     Status: Abnormal   Collection Time    06/28/13  4:41 PM      Result Value Range   Glucose-Capillary 219 (*) 70 - 99 mg/dL   Comment 1 Notify RN    GLUCOSE, CAPILLARY     Status: Abnormal   Collection Time    06/28/13  6:48 PM      Result Value Range   Glucose-Capillary 191 (*) 70 - 99 mg/dL  APTT     Status: None   Collection Time    06/28/13  7:03 PM      Result Value Range   aPTT 30  24 - 37 seconds  PROTIME-INR     Status: None   Collection Time    06/28/13  7:03 PM      Result Value Range   Prothrombin Time 14.0  11.6 - 15.2 seconds   INR 1.10  0.00 - 1.49  SURGICAL PCR SCREEN     Status: None   Collection Time    06/28/13  7:06 PM      Result Value Range   MRSA, PCR NEGATIVE  NEGATIVE   Staphylococcus aureus NEGATIVE  NEGATIVE  GRAM STAIN     Status: None   Collection Time    06/28/13  8:11 PM      Result Value Range   Specimen Description ABSCESS SCROTUM     Special Requests PATIENT ON FOLLOWING LEVIQUIN DOXYCYCLINE     Gram Stain       Value: NO WBC SEEN      MODERATE GRAM POSITIVE COCCI IN PAIRS     Gram Stain Report Called to,Read Back By and Verified With: W ALLRED RN 2053 06/28/13 A NAVARRO   Report Status 06/28/2013 FINAL    GLUCOSE, CAPILLARY     Status: Abnormal   Collection Time    06/28/13  9:02 PM      Result Value Range   Glucose-Capillary 186 (*) 70 - 99 mg/dL   Comment 1 Notify RN     Comment 2 Documented in Chart    GLUCOSE, CAPILLARY     Status: Abnormal   Collection Time    06/28/13 10:35 PM      Result Value Range   Glucose-Capillary 250 (*) 70 - 99 mg/dL  CBC     Status: Abnormal   Collection Time    06/29/13  3:35 AM      Result Value Range   WBC 21.3 (*) 4.0 - 10.5 K/uL   RBC 4.23  4.22 - 5.81 MIL/uL   Hemoglobin 12.9 (*) 13.0 - 17.0 g/dL   HCT 37.4 (*) 39.0 - 52.0 %   MCV 88.4  78.0 - 100.0 fL   MCH 30.5  26.0 - 34.0 pg   MCHC 34.5  30.0 - 36.0 g/dL   RDW 12.1  11.5 - 15.5 %   Platelets 318  150 - 400 K/uL  BASIC METABOLIC PANEL     Status: Abnormal   Collection Time    06/29/13  3:35 AM      Result Value Range   Sodium 130 (*) 137 - 147 mEq/L   Potassium 4.8  3.7 - 5.3 mEq/L   Chloride 97  96 - 112 mEq/L   CO2 22  19 -  32 mEq/L   Glucose, Bld 300 (*) 70 - 99 mg/dL   BUN 22  6 - 23 mg/dL   Creatinine, Ser 1.01  0.50 - 1.35 mg/dL   Calcium 7.9 (*) 8.4 - 10.5 mg/dL   GFR calc non Af Amer 82 (*) >90 mL/min   GFR calc Af Amer >90  >90 mL/min     Studies/Results: @RISRSLT24 @  . citalopram  20 mg Oral Daily  . enoxaparin (LOVENOX) injection  40 mg Subcutaneous Q24H  . fenofibrate  160 mg Oral Daily  . HYDROmorphone      . HYDROmorphone      . HYDROmorphone PCA 0.3 mg/mL   Intravenous Q4H  . insulin aspart  0-15 Units Subcutaneous TID WC  . insulin aspart  5 Units Subcutaneous TID WC  . insulin glargine  50 Units Subcutaneous Daily  . linezolid  600 mg Intravenous Q12H  . lisinopril  10 mg Oral Daily  . nicotine  21 mg Transdermal Daily  . piperacillin-tazobactam (ZOSYN)  IV  3.375 g Intravenous  Q8H  . QUEtiapine Fumarate  150 mg Oral QHS     Assessment/Plan: s/p Procedure(s): INCISION AND DRAINAGE SCROTAL ABSCESS  POD #1. Debridement right inguinal area and scrotal area for complex soft-tissue infection Will change dressing later this morning. Discuss with urology. Check cultures Continue broad-spectrum antibiotic  Diabetes mellitus. Control Tobacco use 40 years History right thigh dissection the melanoma resection 2005 excellent anxiety and depression DJD. Suspect mobility is impaired. Anxiety and depression  @PROBHOSP @  LOS: 3 days    Roshelle Traub M 06/29/2013  . .prob

## 2013-06-29 NOTE — Progress Notes (Signed)
Patient ID: Charles Manning, male   DOB: 05/01/1956, 57 y.o.   MRN: [id number]  Patient without complaint. I reviewed chart and discussed case with Dr. MacDiarmid and Dr. Ingram.   Filed Vitals:   06/29/13 0902  BP:   Pulse:   Temp:   Resp: 20   Physical exam: Patient in no acute distress Scrotum is packed, subcutaneous tissue looks viable, the edge of the scrotal incision especially on the right may be necrotic.  Impression - POD #1. Debridement right inguinal area and scrotal area for complex soft-tissue infection Plan- Given deep scrotal packing and possible need for debridement I discussed with the patient the nature risks benefits and alternatives to exam under anesthesia, dressing changes, possible scrotal debridement tomorrow in the OR. He elects to proceed.   

## 2013-06-29 NOTE — Progress Notes (Signed)
[facility] PROGRESS NOTE  Charles Manning VQM:[id number] DOB: 09-Jul-1965 DOA: 06/26/2013 PCP: Benito Mccreedy, MD   Assessment/Plan: Right epididymo-orchitis - Initially on empiric coverage with IV clindamycin and doxycycline. - Per ID recs, d/c clinda and add Levaquin - Appreciate urology and general surgery involvement - Pt is s/p i/d of abscess with possible debridement - continue IV fluids and prn antiemetics. - Wound culture pending  uncontrolled DM - Persistent hyperglycemia overnight with fsg in 500s with AG of 15.  - Was placed on glucose stabilizer. Now improved and AG closed.  - Increased lantus to 50 units yesterday - diabetic coordinator following  Depression  -continue celexa and seroquel    Hyponatremia  -due to hyperglycemia and dehydration continue fluids and tight blood glucose control  DVT proph: lovenox  Diet: diabetic   Code Status: full code Family Communication: none at bedside Disposition Plan: home once improved   Consultants:  Urology  General surgery  ID   Procedure:   none   Antibiotics:  IV clinda ( 1/19>>06/27/13)  IV doxycycline 1/191/5>>>  IV levaquin ( 1/20>>)  HPI/Subjective: Pt is s/p i/d of scrotal cellulitis. No acute events noted overnight.  Objective: Filed Vitals:   06/29/13 1000  BP: 122/76  Pulse: 70  Temp:   Resp: 16    Intake/Output Summary (Last 24 hours) at 06/29/13 1128 Last data filed at 06/29/13 1015  Gross per 24 hour  Intake   5890 ml  Output   1335 ml  Net   4555 ml   Filed Weights   06/26/13 2300 06/28/13 2300  Weight: 88.3 kg (194 lb 10.7 oz) 96.2 kg (212 lb 1.3 oz)    Exam:   General:  Middle aged male lying in bed appears sleepy and drowsy  HEENT: no Pallor, dry oral mucosa   chest: clear b/l, no added sounds  CVS: NS1&S2,, no MRG  Abd: soft, NT, ND, BS+, swollen scrotum with erythema, no discharge. Warmth and  Tender to palaption  Ext: warm, no edema   CNS:  sleepy , awake to commands and oriented  Data Reviewed: Basic Metabolic Panel:  Recent Labs Lab 06/26/13 1445 06/26/13 2330 06/27/13 0351 06/28/13 0555 06/29/13 0335  NA 129* 127* 128* 130* 130*  K 5.2 4.8 3.9 4.4 4.8  CL 93* 92* 97 95* 97  CO2 21 20 20 20 22   GLUCOSE 526* 392* 282* 310* 300*  BUN 26* 21 17 20 22   CREATININE 0.96 0.85 0.76 0.77 1.01  CALCIUM 9.2 8.6 7.8* 8.2* 7.9*   Liver Function Tests:  Recent Labs Lab 06/24/13 2223  AST 72*  ALT 97*  ALKPHOS 81  BILITOT 0.5  PROT 7.0  ALBUMIN 3.5   No results found for this basename: LIPASE, AMYLASE,  in the last 168 hours No results found for this basename: AMMONIA,  in the last 168 hours CBC:  Recent Labs Lab 06/24/13 2223 06/26/13 1445 06/27/13 0357 06/28/13 0555 06/29/13 0335  WBC 17.8* 23.0* 22.3* 21.0* 21.3*  NEUTROABS 12.2*  --   --  16.8*  --   HGB 15.8 15.2 13.0 12.6* 12.9*  HCT 43.4 43.5 37.3* 36.2* 37.4*  MCV 86.1 89.1 88.0 88.1 88.4  PLT 281 260 253 271 318   Cardiac Enzymes:  Recent Labs Lab 06/24/13 2223  CKTOTAL 109   BNP (last 3 results) No results found for this basename: PROBNP,  in the last 8760 hours CBG:  Recent Labs Lab 06/28/13 1641 06/28/13 1848 06/28/13 2102 06/28/13 2235 06/29/13  [place] 250* 296*    Recent Results (from the past 240 hour(s))  GC/CHLAMYDIA PROBE AMP     Status: None   Collection Time    06/28/13  7:17 AM      Result Value Range Status   CT Probe RNA NEGATIVE  NEGATIVE Final   GC Probe RNA NEGATIVE  NEGATIVE Final   Comment: (NOTE)                                                                                               **Normal Reference Range: Negative**          Assay performed using the Gen-Probe APTIMA COMBO2 (R) Assay.     Acceptable specimen types for this assay include APTIMA Swabs (Unisex,     endocervical, urethral, or vaginal), first void urine, and ThinPrep     liquid based cytology samples.      Performed at [place]     Status: None   Collection Time    06/28/13  7:06 PM      Result Value Range Status   MRSA, PCR NEGATIVE  NEGATIVE Final   Staphylococcus aureus NEGATIVE  NEGATIVE Final   Comment:            The Xpert SA Assay (FDA     approved for NASAL specimens     in patients over 70 years of age),     is one component of     a comprehensive surveillance     program.  Test performance has     been validated by [employer] for patients greater     than or equal to 40 year old.     It is not intended     to diagnose infection nor to     guide or monitor treatment.  CULTURE, ROUTINE-ABSCESS     Status: None   Collection Time    06/28/13  8:11 PM      Result Value Range Status   Specimen Description ABSCESS SCROTUM   Final   Special Requests PATIENT ON FOLLOWING LEVIQUIN DOXYCYCLINE   Final   Gram Stain     Final   Value: ABUNDANT WBC PRESENT, PREDOMINANTLY PMN     NO SQUAMOUS EPITHELIAL CELLS SEEN     MODERATE GRAM POSITIVE COCCI IN PAIRS     Performed by [facility] Gram Stain Report Called to,Read Back By and Verified With: Gram Stain Report Called to,Read Back By and Verified With:  W ALLRED RN AT 2053 ON 06/28/13 A NAVARRO     Performed at [facility]   Culture PENDING   Incomplete   Report Status PENDING   Incomplete  GRAM STAIN     Status: None   Collection Time    06/28/13  8:11 PM      Result Value Range Status   Specimen Description ABSCESS SCROTUM   Final   Special Requests PATIENT ON FOLLOWING LEVIQUIN DOXYCYCLINE   Final   Gram Stain     Final  Value: NO WBC SEEN     MODERATE GRAM POSITIVE COCCI IN PAIRS     Gram Stain Report Called to,Read Back By and Verified With: Carrolyn Meiers RN 2053 06/28/13 A NAVARRO   Report Status 06/28/2013 FINAL   Final     Studies: No results found.  Scheduled Meds: . citalopram  20 mg Oral Daily  . enoxaparin (LOVENOX) injection  40 mg Subcutaneous Q24H  .  fenofibrate  160 mg Oral Daily  . HYDROmorphone PCA 0.3 mg/mL   Intravenous Q4H  . insulin aspart  0-15 Units Subcutaneous TID WC  . insulin aspart  5 Units Subcutaneous TID WC  . insulin glargine  50 Units Subcutaneous Daily  . linezolid  600 mg Intravenous Q12H  . lisinopril  10 mg Oral Daily  . nicotine  21 mg Transdermal Daily  . piperacillin-tazobactam (ZOSYN)  IV  3.375 g Intravenous Q8H  . QUEtiapine Fumarate  150 mg Oral QHS   Continuous Infusions: . dextrose 5 % and 0.45% NaCl 100 mL/hr at 06/29/13 0904    Time spent: 25 minutes  Jenisis Harmsen, [facility] Pager [phone]. If 7PM-7AM, please contact night-coverage at [email or web address], password [facility] 06/29/2013, 11:28 AM  LOS: 3 days

## 2013-06-29 NOTE — Progress Notes (Signed)
INITIAL NUTRITION ASSESSMENT  DOCUMENTATION CODES Per approved criteria  -Obesity Unspecified   INTERVENTION: - Diet advancement per MD - Educated pt on low tyramine diet as pt on an MAOI and also reviewed diabetic diet - Will continue to monitor   NUTRITION DIAGNOSIS: Inadequate oral intake related to clear liquid diet as evidenced by diet order.   Goal: Advance diet as tolerated to diabetic diet  Monitor:  Weights, labs, diet advancement  Reason for Assessment: MAOI education   58 y.o. male  Admitting Dx: Redness in groin   ASSESSMENT: Pt with hx of uncontrolled DM, chronic pain, HTN, depression, h/o melanoma resection from R groin in 2005, and chronic R leg lymphedema presents to the ER today with the above complaints.  He noticed pain and redness in the R groin 2 days ago, the pain and redness extended down to his scrotum. He reports subjective fevers and chills. In ER, noted to have hyperglycemia, leukocytosis and [place] with R epididymitis vs cellulitis. Had incision and drainage of Fournier's gangrene of the scrotum yesterday.   Met with pt who reports eating well at home with good appetite, 2 meals/day. Admits to not following diabetic diet strictly at home but does only drink water or diet soda. Tolerating clear liquid diet.   AST/ALT elevated   Height: Ht Readings from Last 1 Encounters:  06/28/13 '5\' 10"'  (1.778 m)    Weight: Wt Readings from Last 1 Encounters:  06/28/13 212 lb 1.3 oz (96.2 kg)    Ideal Body Weight: 166 lb   % Ideal Body Weight: 128%  Wt Readings from Last 10 Encounters:  06/28/13 212 lb 1.3 oz (96.2 kg)  06/28/13 212 lb 1.3 oz (96.2 kg)  02/16/13 177 lb (80.287 kg)  10/30/12 165 lb (74.844 kg)  03/23/12 165 lb (74.844 kg)  01/19/12 176 lb 3.2 oz (79.924 kg)    Usual Body Weight: 177 lb in September 2014  % Usual Body Weight: 120%  BMI:  Body mass index is 30.43 kg/(m^2). Class I obesity   Estimated Nutritional Needs: Kcal:  1900-2100 Protein: 90-115g Fluid: 1.9-2.1L/day  Skin: +1 RLE, LLE edema, scrotal incision   Diet Order: Clear Liquid  EDUCATION NEEDS: -Education needs addressed - discussed low tyramine diet education for MAOI pt is on and diabetic diet. Handouts provided with RD contact information    Intake/Output Summary (Last 24 hours) at 06/29/13 1210 Last data filed at 06/29/13 1015  Gross per 24 hour  Intake   5890 ml  Output   1135 ml  Net   4755 ml    Last BM: 1/21  Labs:   Recent Labs Lab 06/27/13 0351 06/28/13 0555 06/29/13 0335  NA 128* 130* 130*  K 3.9 4.4 4.8  CL 97 95* 97  CO2 '20 20 22  ' BUN '17 20 22  ' CREATININE 0.76 0.77 1.01  CALCIUM 7.8* 8.2* 7.9*  GLUCOSE 282* 310* 300*    CBG (last 3)   Recent Labs  06/28/13 2102 06/28/13 2235 06/29/13 0816  GLUCAP 186* 250* 296*    Scheduled Meds: . citalopram  20 mg Oral Daily  . enoxaparin (LOVENOX) injection  40 mg Subcutaneous Q24H  . fenofibrate  160 mg Oral Daily  . HYDROmorphone PCA 0.3 mg/mL   Intravenous Q4H  . insulin aspart  0-15 Units Subcutaneous TID WC  . insulin aspart  5 Units Subcutaneous TID WC  . insulin glargine  50 Units Subcutaneous Daily  . linezolid  600 mg Intravenous Q12H  .  lisinopril  10 mg Oral Daily  . nicotine  21 mg Transdermal Daily  . piperacillin-tazobactam (ZOSYN)  IV  3.375 g Intravenous Q8H  . QUEtiapine Fumarate  150 mg Oral QHS    Continuous Infusions: . dextrose 5 % and 0.45% NaCl 100 mL/hr at 06/29/13 5320    Past Medical History  Diagnosis Date  . HTN (hypertension) 09/25/2011  . DM (diabetes mellitus) 09/25/2011  . Degenerative joint disease 09/25/2011  . Depression 09/25/2011  . Anxiety 09/25/2011  . Chronic pain syndrome 09/25/2011  . Basal cell cancer 09/25/2011    Mid back 2007  . Melanoma 09/25/2011    Right thigh 2005    Past Surgical History  Procedure Laterality Date  . Tumor removal from right thigh    . Lymph node removal      Charles College MS,  RD, LDN [phone] Pager [phone] After Hours Pager

## 2013-06-29 NOTE — Progress Notes (Signed)
Gen. Surgery:  I changed the right inguinal dressing this morning. It is a large long wound but it is completely clean and does not need any further debridement.  I will discuss with Dr. Junious Silk  And the Urology group regarding management of the scrotal wound... specifically decisions as to wound care and whether this needs to be reexplored in the operating room.  Edsel Petrin. Dalbert Batman, M.D., [facility] General and Minimally invasive Surgery Breast and Colorectal Surgery Office:   [phone] Pager:   [phone]

## 2013-06-29 NOTE — Progress Notes (Signed)
Inpatient Diabetes Program Recommendations  AACE/ADA: New Consensus Statement on Inpatient Glycemic Control (2013)  Target Ranges:  Prepandial:   less than 140 mg/dL      Peak postprandial:   less than 180 mg/dL (1-2 hours)      Critically ill patients:  140 - 180 mg/dL   Reason for Visit: Results for DIRK, VANAMAN (MRN [id number]) as of 06/29/2013 11:11  Ref. Range 06/28/2013 21:02 06/28/2013 22:35 06/29/2013 03:35 06/29/2013 08:16  Glucose-Capillary Latest Range: 70-99 mg/dL 186 (H) 250 (H)  296 (H)   Note increased Lantus today.   Consider increasing Novolog correction to q 4 hours.  Also will likely need further titration of Lantus based on home dose of 70/30 60 units bid.  Thanks, Adah Perl, RN, BC-ADM Inpatient Diabetes Coordinator Pager [phone]

## 2013-06-30 ENCOUNTER — Encounter (HOSPITAL_COMMUNITY)
Admission: EM | Disposition: A | Discharge status: Home Health | Payer: Self-pay | Source: Home / Self Care | Attending: Internal Medicine

## 2013-06-30 ENCOUNTER — Encounter (HOSPITAL_COMMUNITY): Payer: Self-pay | Admitting: Anesthesiology

## 2013-06-30 ENCOUNTER — Encounter (HOSPITAL_COMMUNITY): Payer: Medicare Other | Admitting: Anesthesiology

## 2013-06-30 ENCOUNTER — Inpatient Hospital Stay (HOSPITAL_COMMUNITY): Payer: Medicare Other | Admitting: Anesthesiology

## 2013-06-30 DIAGNOSIS — N498 Inflammatory disorders of other specified male genital organs: Secondary | ICD-10-CM

## 2013-06-30 DIAGNOSIS — IMO0002 Reserved for concepts with insufficient information to code with codable children: Secondary | ICD-10-CM

## 2013-06-30 DIAGNOSIS — N501 Vascular disorders of male genital organs: Secondary | ICD-10-CM

## 2013-06-30 HISTORY — PX: SCROTAL EXPLORATION: SHX2386

## 2013-06-30 LAB — GLUCOSE, CAPILLARY
GLUCOSE-CAPILLARY: 148 mg/dL — AB (ref 70–99)
GLUCOSE-CAPILLARY: 156 mg/dL — AB (ref 70–99)
Glucose-Capillary: 115 mg/dL — ABNORMAL HIGH (ref 70–99)
Glucose-Capillary: 232 mg/dL — ABNORMAL HIGH (ref 70–99)

## 2013-06-30 SURGERY — EXPLORATION, SCROTUM
Anesthesia: General | Site: Groin

## 2013-06-30 MED ORDER — FENTANYL CITRATE 0.05 MG/ML IJ SOLN
INTRAMUSCULAR | Status: DC | PRN
  Administered 2013-06-30: 100 ug via INTRAVENOUS
  Administered 2013-06-30: 25 ug via INTRAVENOUS
  Administered 2013-06-30: 50 ug via INTRAVENOUS

## 2013-06-30 MED ORDER — DIPHENHYDRAMINE HCL 50 MG/ML IJ SOLN
INTRAMUSCULAR | Status: AC
  Filled 2013-06-30: qty 1

## 2013-06-30 MED ORDER — HYDROMORPHONE HCL PF 1 MG/ML IJ SOLN
INTRAMUSCULAR | Status: AC
  Filled 2013-06-30: qty 1

## 2013-06-30 MED ORDER — MIDAZOLAM HCL 5 MG/5ML IJ SOLN
INTRAMUSCULAR | Status: DC | PRN
  Administered 2013-06-30: 2 mg via INTRAVENOUS

## 2013-06-30 MED ORDER — CITALOPRAM HYDROBROMIDE 10 MG PO TABS
10.0000 mg | ORAL_TABLET | Freq: Every day | ORAL | Status: DC
  Administered 2013-06-30 – 2013-07-02 (×3): 10 mg via ORAL
  Filled 2013-06-30 (×3): qty 1

## 2013-06-30 MED ORDER — PROPOFOL 10 MG/ML IV BOLUS
INTRAVENOUS | Status: DC | PRN
  Administered 2013-06-30: 200 mg via INTRAVENOUS

## 2013-06-30 MED ORDER — 0.9 % SODIUM CHLORIDE (POUR BTL) OPTIME
TOPICAL | Status: DC | PRN
  Administered 2013-06-30: 1000 mL

## 2013-06-30 MED ORDER — PROPOFOL 10 MG/ML IV BOLUS
INTRAVENOUS | Status: AC
  Filled 2013-06-30: qty 20

## 2013-06-30 MED ORDER — SODIUM CHLORIDE 0.9 % IJ SOLN
INTRAMUSCULAR | Status: AC
  Filled 2013-06-30: qty 10

## 2013-06-30 MED ORDER — PROMETHAZINE HCL 25 MG/ML IJ SOLN
6.2500 mg | INTRAMUSCULAR | Status: DC | PRN
Start: 2013-06-30 — End: 2013-06-30

## 2013-06-30 MED ORDER — EPHEDRINE SULFATE 50 MG/ML IJ SOLN
INTRAMUSCULAR | Status: AC
  Filled 2013-06-30: qty 1

## 2013-06-30 MED ORDER — SODIUM CHLORIDE 0.9 % IV SOLN
INTRAVENOUS | Status: DC | PRN
  Administered 2013-06-30 (×2): via INTRAVENOUS

## 2013-06-30 MED ORDER — FENTANYL CITRATE 0.05 MG/ML IJ SOLN
INTRAMUSCULAR | Status: AC
  Filled 2013-06-30: qty 5

## 2013-06-30 MED ORDER — LINEZOLID 2 MG/ML IV SOLN
600.0000 mg | Freq: Two times a day (BID) | INTRAVENOUS | Status: DC
  Administered 2013-06-30 – 2013-07-02 (×4): 600 mg via INTRAVENOUS
  Filled 2013-06-30 (×5): qty 300

## 2013-06-30 MED ORDER — SODIUM CHLORIDE 0.9 % IR SOLN
Status: DC | PRN
  Administered 2013-06-30 (×2): 1000 mL

## 2013-06-30 MED ORDER — BUPIVACAINE HCL (PF) 0.25 % IJ SOLN
INTRAMUSCULAR | Status: AC
  Filled 2013-06-30: qty 30

## 2013-06-30 MED ORDER — ONDANSETRON HCL 4 MG/2ML IJ SOLN
INTRAMUSCULAR | Status: AC
  Filled 2013-06-30: qty 2

## 2013-06-30 MED ORDER — ONDANSETRON HCL 4 MG/2ML IJ SOLN
INTRAMUSCULAR | Status: DC | PRN
  Administered 2013-06-30: 4 mg via INTRAVENOUS

## 2013-06-30 MED ORDER — HYDROMORPHONE HCL PF 1 MG/ML IJ SOLN
0.2500 mg | INTRAMUSCULAR | Status: DC | PRN
Start: 2013-06-30 — End: 2013-06-30
  Administered 2013-06-30 (×2): 0.5 mg via INTRAVENOUS

## 2013-06-30 MED ORDER — SUCCINYLCHOLINE CHLORIDE 20 MG/ML IJ SOLN
INTRAMUSCULAR | Status: DC | PRN
  Administered 2013-06-30: 140 mg via INTRAVENOUS

## 2013-06-30 MED ORDER — LIDOCAINE HCL (CARDIAC) 20 MG/ML IV SOLN
INTRAVENOUS | Status: AC
  Filled 2013-06-30: qty 5

## 2013-06-30 MED ORDER — LACTATED RINGERS IV SOLN: INTRAVENOUS | Status: DC

## 2013-06-30 MED ORDER — MIDAZOLAM HCL 2 MG/2ML IJ SOLN
INTRAMUSCULAR | Status: AC
  Filled 2013-06-30: qty 2

## 2013-06-30 MED ORDER — LIDOCAINE HCL (CARDIAC) 20 MG/ML IV SOLN
INTRAVENOUS | Status: DC | PRN
  Administered 2013-06-30: 60 mg via INTRAVENOUS

## 2013-06-30 MED ORDER — MEPERIDINE HCL 50 MG/ML IJ SOLN: 6.2500 mg | INTRAMUSCULAR | Status: DC | PRN

## 2013-06-30 MED ORDER — EPHEDRINE SULFATE 50 MG/ML IJ SOLN
INTRAMUSCULAR | Status: DC | PRN
  Administered 2013-06-30: 10 mg via INTRAVENOUS
  Administered 2013-06-30: 5 mg via INTRAVENOUS

## 2013-06-30 MED ORDER — CITALOPRAM HYDROBROMIDE 10 MG PO TABS: 10.0000 mg | ORAL_TABLET | Freq: Every day | ORAL | Status: DC

## 2013-06-30 SURGICAL SUPPLY — 24 items
BANDAGE GAUZE ELAST BULKY 4 IN (GAUZE/BANDAGES/DRESSINGS) ×3 IMPLANT
DRAPE LAPAROTOMY TRNSV 102X78 (DRAPE) ×3 IMPLANT
ELECT NEEDLE TIP 2.8 STRL (NEEDLE) ×3 IMPLANT
ELECT REM PT RETURN 9FT ADLT (ELECTROSURGICAL) ×3
ELECTRODE REM PT RTRN 9FT ADLT (ELECTROSURGICAL) ×1 IMPLANT
GOWN STRL REUS W/TWL LRG LVL3 (GOWN DISPOSABLE) ×6 IMPLANT
HANDPIECE INTERPULSE COAX TIP (DISPOSABLE) ×3
NDL HYPO 25X1 1.5 SAFETY (NEEDLE) IMPLANT
NEEDLE HYPO 25X1 1.5 SAFETY (NEEDLE) ×3 IMPLANT
PACK BASIC VI WITH GOWN DISP (CUSTOM PROCEDURE TRAY) ×1 IMPLANT
PACK GENERAL/GYN (CUSTOM PROCEDURE TRAY) ×3 IMPLANT
PAD ABD 8X10 STRL (GAUZE/BANDAGES/DRESSINGS) ×2 IMPLANT
SET HNDPC FAN SPRY TIP SCT (DISPOSABLE) IMPLANT
SPONGE GAUZE 4X4 12PLY (GAUZE/BANDAGES/DRESSINGS) ×3 IMPLANT
SPONGE LAP 18X18 X RAY DECT (DISPOSABLE) ×4 IMPLANT
SUPPORT SCROTAL MED ADLT STRP (MISCELLANEOUS) ×2 IMPLANT
SUPPORTER ATHLETIC MED (MISCELLANEOUS) ×1
SUT ETHILON 2 0 PS N (SUTURE) ×4 IMPLANT
SUT VIC AB 2-0 CT2 27 (SUTURE) ×3 IMPLANT
SUT VIC AB 2-0 SH 27 (SUTURE) ×12
SUT VIC AB 2-0 SH 27X BRD (SUTURE) IMPLANT
SYR CONTROL 10ML LL (SYRINGE) ×2 IMPLANT
TRAY FOLEY CATH 14FRSI W/METER (CATHETERS) ×2 IMPLANT
TRAY FOLEY CATH 16FRSI W/METER (SET/KITS/TRAYS/PACK) ×2 IMPLANT

## 2013-06-30 NOTE — Transfer of Care (Signed)
Immediate Anesthesia Transfer of Care Note  Patient: Charles Manning  Procedure(s) Performed: Procedure(s): SCROTUM DEBRIDEMENT (N/A)  Patient Location: PACU  Anesthesia Type:General  Level of Consciousness: awake, alert  and oriented  Airway & Oxygen Therapy: Patient Spontanous Breathing and Patient connected to face mask oxygen  Post-op Assessment: Report given to PACU RN and Post -op Vital signs reviewed and stable  Post vital signs: Reviewed and stable  Complications: No apparent anesthesia complications

## 2013-06-30 NOTE — Progress Notes (Signed)
ANTIBIOTIC CONSULT NOTE - Follow up  Pharmacy Consult for Vancomycin and Zosyn Indication: cellulitis with abscess  Allergies  Allergen Reactions  . Vancomycin Itching and Rash    Patient Measurements: Height: 5\' 10"  (177.8 cm) Weight: 212 lb 1.3 oz (96.2 kg) IBW/kg (Calculated) : 73  Vital Signs: Temp: 98.7 F (37.1 C) (01/23 0927) Temp src: Oral (01/23 0343) BP: 156/77 mmHg (01/23 0927) Pulse Rate: 90 (01/23 0927) Intake/Output from previous day: 01/22 0701 - 01/23 0700 In: 7957.2 [P.O.:1540; I.V.:5267.2; IV Piggyback:1150] Out: 2215 [Urine:2215] Intake/Output from this shift: Total I/O In: 1100 [I.V.:1100] Out: 225 [Urine:225]  Labs:  Recent Labs  06/28/13 0555 06/29/13 0335  WBC 21.0* 21.3*  HGB 12.6* 12.9*  PLT 271 318  CREATININE 0.77 1.01   Estimated Creatinine Clearance: 96.2 ml/min (by C-G formula based on Cr of 1.01). No results found for this basename: Letta Median, VANCORANDOM, GENTTROUGH, GENTPEAK, GENTRANDOM, TOBRATROUGH, TOBRAPEAK, TOBRARND, AMIKACINPEAK, AMIKACINTROU, AMIKACIN,  in the last 72 hours   Assessment: 73 yoM presented 1/19 with R scrotal swelling, redness and pain. Ultrasound c/w epididimo-orchitis. ID on board, and changed abx to Levaquin, Doxycyline. On 1/21, pt noticed with incr swelling of the R hemiscrotum and enlargement of R testicle worrisome for Forniere's.  I&D performed 1/21.  MD broadened abx to Linezolid and Zosyn since patient had allergy listed to vancomycin for itching.  Patient has since tolerated vancomycin and ID has ordered to change abx to vancomycin today.  By switching antibiotics to vancomycin, able to avoid interaction with Linezolid and patient's home Celexa.  1/19 Ceftriaxone x 1  1/19 >> clindamycin >> 1/20 1/20 >> doxycycline >> 1/21 1/20 >> Levaquin >> 1/21 1/21 >> Linezolid >> 1/22 1/21 >> Zosyn >> 1/22 >> Vanc >>  Tmax: AF WBCs: remain elevated Renal: SCr 1.01, 96CG  1/21 abscess cx:  moderate GPC in pairs  Goal of Therapy:  Vancomycin trough level 15-20 mcg/ml  Plan:   Cont Zosyn 3.375g IV Q8H infused over 4hrs.  Cont Vancomycin  1g IV q8h - infused slowly over 2.5hrs and pre-medicate with Benadryl.  Measure Vanc trough today prior to 4th dose.  Follow up renal fxn and culture results.  Romeo Rabon, PharmD, pager [phone]. 06/30/2013,9:58 AM.

## 2013-06-30 NOTE — Progress Notes (Signed)
2 Days Post-Op  Subjective: More alert. Less agitated. Afebrile. No tachycardia. Pain is a little better. No bleeding from wounds.  Scheduled to return to OR today by Dr. Junious Silk for dressing change and debridement of scrotal wound.  Cultures GPC  Objective: Vital signs in last 24 hours: Temp:  [97.4 F (36.3 C)-99 F (37.2 C)] 98.2 F (36.8 C) (01/23 0343) Pulse Rate:  [64-79] 74 (01/23 0336) Resp:  [2-32] 15 (01/23 0336) BP: (100-140)/(61-80) 140/68 mmHg (01/23 0336) SpO2:  [95 %-99 %] 98 % (01/23 0336) FiO2 (%):  [96 %] 96 % (01/22 0800) Last BM Date: 06/28/13  Intake/Output from previous day: 01/22 0701 - 01/23 0700 In: 7707.2 [P.O.:1540; I.V.:5067.2; IV Piggyback:1100] Out: 4259 [Urine:1740] Intake/Output this shift: Total I/O In: 1417.2 [I.V.:1167.2; IV Piggyback:250] Out: 1300 [Urine:1300]    EXAM: General appearance: alert.  It does not appear to be in any distress. more cooperative. Less agitated. Male genitalia:right inguinal incision packed. Seems clean. Still with induration and tenderness  throughout scrotum with open packing. No odor. No bleeding.  Lab Results:  Results for orders placed during the hospital encounter of 06/26/13 (from the past 24 hour(s))  GLUCOSE, CAPILLARY     Status: Abnormal   Collection Time    06/29/13  8:16 AM      Result Value Range   Glucose-Capillary 296 (*) 70 - 99 mg/dL  GLUCOSE, CAPILLARY     Status: Abnormal   Collection Time    06/29/13 11:17 AM      Result Value Range   Glucose-Capillary 166 (*) 70 - 99 mg/dL  GLUCOSE, CAPILLARY     Status: None   Collection Time    06/29/13  4:24 PM      Result Value Range   Glucose-Capillary 94  70 - 99 mg/dL  GLUCOSE, CAPILLARY     Status: None   Collection Time    06/29/13  9:24 PM      Result Value Range   Glucose-Capillary 83  70 - 99 mg/dL   Comment 1 Documented in Chart     Comment 2 Notify RN       Studies/Results: @RISRSLT24 @  . citalopram  20 mg Oral Daily  .  diphenhydrAMINE  25-50 mg Intravenous Q8H  . enoxaparin (LOVENOX) injection  40 mg Subcutaneous Q24H  . fenofibrate  160 mg Oral Daily  . HYDROmorphone PCA 0.3 mg/mL   Intravenous Q4H  . insulin aspart  0-15 Units Subcutaneous TID WC  . insulin aspart  5 Units Subcutaneous TID WC  . insulin glargine  50 Units Subcutaneous Daily  . lisinopril  10 mg Oral Daily  . nicotine  21 mg Transdermal Daily  . piperacillin-tazobactam (ZOSYN)  IV  3.375 g Intravenous Q8H  . QUEtiapine Fumarate  150 mg Oral QHS  . vancomycin  1,000 mg Intravenous Q8H     Assessment/Plan: s/p Procedure(s): INCISION AND DRAINAGE SCROTAL ABSCESS  POD #2. Debridement right inguinal area and scrotal area for complex soft-tissue infection  Discussed with Dr. Junious Silk. Return to OR today for examination, dressing change, and debridement as necessary. I am available in-house if needed. Check cultures  Continue broad-spectrum antibiotic   Diabetes mellitus. Control  Tobacco use 40 years  History right thigh dissection the melanoma resection 2005 excellent anxiety and depression  DJD. Suspect mobility is impaired.  Anxiety and depression   @PROBHOSP @  LOS: 4 days    Charles Manning M 06/30/2013  . .prob

## 2013-06-30 NOTE — Progress Notes (Addendum)
[facility]  Date of Admission:  06/26/2013  Antibiotics: Antibiotics Given (last 72 hours)   Date/Time Action Medication Dose Rate   06/27/13 2101 Given   doxycycline (VIBRA-TABS) tablet 100 mg 100 mg    06/28/13 1117 Given   doxycycline (VIBRA-TABS) tablet 100 mg 100 mg    06/28/13 1118 Given   levofloxacin (LEVAQUIN) tablet 500 mg 500 mg    06/28/13 2030 Given   piperacillin-tazobactam (ZOSYN) IVPB 3.375 g 3.375 g    06/28/13 2104 Given  [pharmacy aware/dose received from OR]   linezolid (ZYVOX) IVPB 600 mg 600 mg 300 mL/hr   06/29/13 0431 Given   piperacillin-tazobactam (ZOSYN) IVPB 3.375 g 3.375 g 12.5 mL/hr   06/29/13 1015 Given   linezolid (ZYVOX) IVPB 600 mg 600 mg 300 mL/hr   06/29/13 1237 Given   piperacillin-tazobactam (ZOSYN) IVPB 3.375 g 3.375 g 12.5 mL/hr   06/29/13 1640 Given   vancomycin (VANCOCIN) IVPB 1000 mg/200 mL premix 1,000 mg 80 mL/hr   06/29/13 1958 Given   piperacillin-tazobactam (ZOSYN) IVPB 3.375 g 3.375 g 12.5 mL/hr   06/30/13 0016 Given   vancomycin (VANCOCIN) IVPB 1000 mg/200 mL premix 1,000 mg 80 mL/hr   06/30/13 0415 Given   piperacillin-tazobactam (ZOSYN) IVPB 3.375 g 3.375 g 12.5 mL/hr   06/30/13 0805 Given   vancomycin (VANCOCIN) IVPB 1000 mg/200 mL premix 1,000 mg    06/30/13 1247 Given   piperacillin-tazobactam (ZOSYN) IVPB 3.375 g 3.375 g 12.5 mL/hr      Subjective: No new complaints  Objective: Temp:  [98.2 F (36.8 C)-100.4 F (38 C)] 100.4 F (38 C) (01/23 1200) Pulse Rate:  [64-90] 82 (01/23 1035) Resp:  [11-32] 16 (01/23 1111) BP: (115-156)/(61-80) 137/63 mmHg (01/23 1035) SpO2:  [94 %-100 %] 100 % (01/23 1111)  General: awake, alert, nad Skin: no rashes Lungs: CTA GU: wrapped  Lab Results Lab Results  Component Value Date   WBC 21.3* 06/29/2013   HGB 12.9* 06/29/2013   HCT 37.4* 06/29/2013   MCV 88.4 06/29/2013   PLT 318 06/29/2013    Lab Results  Component Value Date   CREATININE  1.01 06/29/2013   BUN 22 06/29/2013   NA 130* 06/29/2013   K 4.8 06/29/2013   CL 97 06/29/2013   CO2 22 06/29/2013    Lab Results  Component Value Date   ALT 97* 06/24/2013   AST 72* 06/24/2013   ALKPHOS 81 06/24/2013   BILITOT 0.5 06/24/2013      Microbiology: Recent Results (from the past 240 hour(s))  GC/CHLAMYDIA PROBE AMP     Status: None   Collection Time    06/28/13  7:17 AM      Result Value Range Status   CT Probe RNA NEGATIVE  NEGATIVE Final   GC Probe RNA NEGATIVE  NEGATIVE Final   Comment: (NOTE)                                                                                               **Normal Reference Range: Negative**          Assay  performed using the Gen-Probe APTIMA COMBO2 (R) Assay.     Acceptable specimen types for this assay include APTIMA Swabs (Unisex,     endocervical, urethral, or vaginal), first void urine, and ThinPrep     liquid based cytology samples.     Performed at [place]     Status: None   Collection Time    06/28/13  7:06 PM      Result Value Range Status   MRSA, PCR NEGATIVE  NEGATIVE Final   Staphylococcus aureus NEGATIVE  NEGATIVE Final   Comment:            The Xpert SA Assay (FDA     approved for NASAL specimens     in patients over 33 years of age),     is one component of     a comprehensive surveillance     program.  Test performance has     been validated by [employer] for patients greater     than or equal to 21 year old.     It is not intended     to diagnose infection nor to     guide or monitor treatment.  CULTURE, ROUTINE-ABSCESS     Status: None   Collection Time    06/28/13  8:11 PM      Result Value Range Status   Specimen Description ABSCESS SCROTUM   Final   Special Requests PATIENT ON FOLLOWING LEVIQUIN DOXYCYCLINE   Final   Gram Stain     Final   Value: ABUNDANT WBC PRESENT, PREDOMINANTLY PMN     NO SQUAMOUS EPITHELIAL CELLS SEEN     MODERATE GRAM POSITIVE COCCI IN PAIRS      Performed by [facility] Gram Stain Report Called to,Read Back By and Verified With: Gram Stain Report Called to,Read Back By and Verified With:  W ALLRED RN AT 2053 ON 06/28/13 A NAVARRO     Performed at [place] PENDING   Incomplete   Report Status PENDING   Incomplete  ANAEROBIC CULTURE     Status: None   Collection Time    06/28/13  8:11 PM      Result Value Range Status   Specimen Description ABSCESS SCROTUM   Final   Special Requests PATIENT ON FOLLOWING LEVIQUIN DOXYCYCLINE   Final   Gram Stain     Final   Value: NO WBC SEEN     NO SQUAMOUS EPITHELIAL CELLS SEEN     MODERATE GRAM POSITIVE COCCI IN PAIRS     Performed at [facility]   Culture PENDING   Incomplete   Report Status PENDING   Incomplete  GRAM STAIN     Status: None   Collection Time    06/28/13  8:11 PM      Result Value Range Status   Specimen Description ABSCESS SCROTUM   Final   Special Requests PATIENT ON FOLLOWING LEVIQUIN DOXYCYCLINE   Final   Gram Stain     Final   Value: NO WBC SEEN     MODERATE GRAM POSITIVE COCCI IN PAIRS     Gram Stain Report Called to,Read Back By and Verified With: Carrolyn Meiers RN 2053 06/28/13 A NAVARRO   Report Status 06/28/2013 FINAL   Final    Studies/Results: No results found.  Assessment/Plan: 1)  Cellulitis with abscess - POD#2.  Examined yesterday in OR  Gram stain with  GPC, culture negative so far.    Based on gram stain likely Staph or Strep but will watch culture.  Though no clear picture of the infection has arisen and since he did have some necrosis noted, I will change him back to linezolid for the Staph coverage and toxin effect.  He can take this IV or oral if no further OR trips planned. I will wean his Celexa. He can also switch to oral levaquin if not going to OR again.    ADDENDUM: he was not on Celexa at home and has not been on it for years so I will stop it.    Dr. Megan Salon will be available over the weekend if needed  and will monitor cultures, otheriwise I will follow up on Monday.    Scharlene Gloss, [place] [facility] [place] [email or web address] [phone] pager   [phone] cell 06/30/2013, 1:07 PM

## 2013-06-30 NOTE — Anesthesia Preprocedure Evaluation (Addendum)
Anesthesia Evaluation  Patient identified by MRN, date of birth, ID band Patient awake    Reviewed: Allergy & Precautions, H&P , NPO status , Patient's Chart, lab work & pertinent test results  Airway Mallampati: II TM Distance: >3 FB Neck ROM: full    Dental no notable dental hx. (+) Teeth Intact and Dental Advisory Given   Pulmonary Current Smoker,  breath sounds clear to auscultation  Pulmonary exam normal       Cardiovascular Exercise Tolerance: Good hypertension, Pt. on medications Rhythm:regular Rate:Normal     Neuro/Psych Anxiety Depression negative neurological ROS  negative psych ROS   GI/Hepatic negative GI ROS, Neg liver ROS, (+)     substance abuse  IV drug use,   Endo/Other  diabetes, Poorly Controlled, Type 2, Insulin Dependent  Renal/GU negative Renal ROS  negative genitourinary   Musculoskeletal   Abdominal   Peds  Hematology negative hematology ROS (+)   Anesthesia Other Findings   Reproductive/Obstetrics negative OB ROS                          Anesthesia Physical  Anesthesia Plan  ASA: III  Anesthesia Plan: General   Post-op Pain Management:    Induction: Intravenous  Airway Management Planned: Oral ETT  Additional Equipment:   Intra-op Plan:   Post-operative Plan: Extubation in OR  Informed Consent: I have reviewed the patients History and Physical, chart, labs and discussed the procedure including the risks, benefits and alternatives for the proposed anesthesia with the patient or authorized representative who has indicated his/her understanding and acceptance.   Dental Advisory Given  Plan Discussed with: CRNA and Surgeon  Anesthesia Plan Comments:         Anesthesia Quick Evaluation

## 2013-06-30 NOTE — Progress Notes (Signed)
[facility] PROGRESS NOTE  Charles Manning [id number] DOB: [birth year] DOA: 06/26/2013 PCP: Benito Mccreedy, MD   Assessment/Plan: Right epididymo-orchitis - Initially on empiric coverage with IV clindamycin and doxycycline. - Per ID recs, on zosyn and linezolid - Appreciate urology and general surgery involvement - Pt is s/p i/d of abscess with possible debridement - continue IV fluids and prn antiemetics.  uncontrolled DM - Persistent hyperglycemia overnight with fsg in 500s with AG of 15.  - Was placed on glucose stabilizer. Now improved and AG closed.  - Increased lantus to 50 units yesterday - diabetic coordinator following  Depression  -continue celexa and seroquel    Hyponatremia  -due to hyperglycemia and dehydration continue fluids and tight blood glucose control  DVT proph: lovenox  Diet: diabetic   Code Status: full code Family Communication: none at bedside Disposition Plan: home once improved   Consultants:  Urology  General surgery  ID   Procedure:   none   Antibiotics:  IV clinda ( 1/19>>06/27/13)  IV doxycycline 1/191/5>>>06/30/13  IV levaquin ( 1/20>>06/30/13)  Linezolid 06/30/13>>>  Zosyn 06/29/13>>>  HPI/Subjective: No acute events noted overnight.  Objective: Filed Vitals:   06/30/13 1500  BP: 134/98  Pulse: 102  Temp:   Resp: 25    Intake/Output Summary (Last 24 hours) at 06/30/13 1538 Last data filed at 06/30/13 1500  Gross per 24 hour  Intake 4837.24 ml  Output   2805 ml  Net 2032.24 ml   Filed Weights   06/26/13 2300 06/28/13 2300  Weight: 88.3 kg (194 lb 10.7 oz) 96.2 kg (212 lb 1.3 oz)    Exam:   General:  Middle aged male lying in bed appears sleepy and drowsy  HEENT: no Pallor, dry oral mucosa   chest: clear b/l, no added sounds  CVS: NS1&S2,, no MRG  Abd: soft, NT, ND, BS+, swollen scrotum with erythema, no discharge. Warmth and  Tender to palaption  Ext: warm, no edema   CNS: sleepy  , awake to commands and oriented  Data Reviewed: Basic Metabolic Panel:  Recent Labs Lab 06/26/13 1445 06/26/13 2330 06/27/13 0351 06/28/13 0555 06/29/13 0335  NA 129* 127* 128* 130* 130*  K 5.2 4.8 3.9 4.4 4.8  CL 93* 92* 97 95* 97  CO2 21 20 20 20 22   GLUCOSE 526* 392* 282* 310* 300*  BUN 26* 21 17 20 22   CREATININE 0.96 0.85 0.76 0.77 1.01  CALCIUM 9.2 8.6 7.8* 8.2* 7.9*   Liver Function Tests:  Recent Labs Lab 06/24/13 2223  AST 72*  ALT 97*  ALKPHOS 81  BILITOT 0.5  PROT 7.0  ALBUMIN 3.5   No results found for this basename: LIPASE, AMYLASE,  in the last 168 hours No results found for this basename: AMMONIA,  in the last 168 hours CBC:  Recent Labs Lab 06/24/13 2223 06/26/13 1445 06/27/13 0357 06/28/13 0555 06/29/13 0335  WBC 17.8* 23.0* 22.3* 21.0* 21.3*  NEUTROABS 12.2*  --   --  16.8*  --   HGB 15.8 15.2 13.0 12.6* 12.9*  HCT 43.4 43.5 37.3* 36.2* 37.4*  MCV 86.1 89.1 88.0 88.1 88.4  PLT 281 260 253 271 318   Cardiac Enzymes:  Recent Labs Lab 06/24/13 2223  CKTOTAL 109   BNP (last 3 results) No results found for this basename: PROBNP,  in the last 8760 hours CBG:  Recent Labs Lab 06/29/13 1624 06/29/13 2124 06/30/13 0707 06/30/13 0942 06/30/13 1203  GLUCAP 94 83 115* 148* 156*  Recent Results (from the past 240 hour(s))  GC/CHLAMYDIA PROBE AMP     Status: None   Collection Time    06/28/13  7:17 AM      Result Value Range Status   CT Probe RNA NEGATIVE  NEGATIVE Final   GC Probe RNA NEGATIVE  NEGATIVE Final   Comment: (NOTE)                                                                                               **Normal Reference Range: Negative**          Assay performed using the Gen-Probe APTIMA COMBO2 (R) Assay.     Acceptable specimen types for this assay include APTIMA Swabs (Unisex,     endocervical, urethral, or vaginal), first void urine, and ThinPrep     liquid based cytology samples.     Performed at  [facility]     Status: None   Collection Time    06/28/13  7:06 PM      Result Value Range Status   MRSA, PCR NEGATIVE  NEGATIVE Final   Staphylococcus aureus NEGATIVE  NEGATIVE Final   Comment:            The Xpert SA Assay (FDA     approved for NASAL specimens     in patients over 73 years of age),     is one component of     a comprehensive surveillance     program.  Test performance has     been validated by [employer] for patients greater     than or equal to 63 year old.     It is not intended     to diagnose infection nor to     guide or monitor treatment.  CULTURE, ROUTINE-ABSCESS     Status: None   Collection Time    06/28/13  8:11 PM      Result Value Range Status   Specimen Description ABSCESS SCROTUM   Final   Special Requests PATIENT ON FOLLOWING LEVIQUIN DOXYCYCLINE   Final   Gram Stain     Final   Value: ABUNDANT WBC PRESENT, PREDOMINANTLY PMN     NO SQUAMOUS EPITHELIAL CELLS SEEN     MODERATE GRAM POSITIVE COCCI IN PAIRS     Performed by [facility] Gram Stain Report Called to,Read Back By and Verified With: Gram Stain Report Called to,Read Back By and Verified With:  W ALLRED RN AT 2053 ON 06/28/13 A NAVARRO     Performed at [facility]   Culture     Final   Value: MODERATE GROUP B STREP(S.AGALACTIAE)ISOLATED     Note: TESTING AGAINST S. AGALACTIAE NOT ROUTINELY PERFORMED DUE TO PREDICTABILITY OF AMP/PEN/VAN SUSCEPTIBILITY.     Performed at [facility]   Report Status PENDING   Incomplete  ANAEROBIC CULTURE     Status: None   Collection Time    06/28/13  8:11 PM      Result Value Range Status   Specimen Description ABSCESS SCROTUM  Final   Special Requests PATIENT ON FOLLOWING LEVIQUIN DOXYCYCLINE   Final   Gram Stain     Final   Value: NO WBC SEEN     NO SQUAMOUS EPITHELIAL CELLS SEEN     MODERATE GRAM POSITIVE COCCI IN PAIRS     Performed at [facility]   Culture     Final    Value: NO ANAEROBES ISOLATED; CULTURE IN PROGRESS FOR 5 DAYS     Performed at [facility]   Report Status PENDING   Incomplete  GRAM STAIN     Status: None   Collection Time    06/28/13  8:11 PM      Result Value Range Status   Specimen Description ABSCESS SCROTUM   Final   Special Requests PATIENT ON FOLLOWING LEVIQUIN DOXYCYCLINE   Final   Gram Stain     Final   Value: NO WBC SEEN     MODERATE GRAM POSITIVE COCCI IN PAIRS     Gram Stain Report Called to,Read Back By and Verified With: Carrolyn Meiers RN 2053 06/28/13 A NAVARRO   Report Status 06/28/2013 FINAL   Final     Studies: No results found.  Scheduled Meds: . citalopram  10 mg Oral Daily  . diphenhydrAMINE  25-50 mg Intravenous Q8H  . enoxaparin (LOVENOX) injection  40 mg Subcutaneous Q24H  . fenofibrate  160 mg Oral Daily  . HYDROmorphone      . HYDROmorphone PCA 0.3 mg/mL   Intravenous Q4H  . insulin aspart  0-15 Units Subcutaneous TID WC  . insulin aspart  5 Units Subcutaneous TID WC  . insulin glargine  50 Units Subcutaneous Daily  . linezolid  600 mg Intravenous Q12H  . lisinopril  10 mg Oral Daily  . nicotine  21 mg Transdermal Daily  . piperacillin-tazobactam (ZOSYN)  IV  3.375 g Intravenous Q8H  . QUEtiapine Fumarate  150 mg Oral QHS   Continuous Infusions: . dextrose 5 % and 0.45% NaCl 100 mL (06/30/13 0546)    Time spent: 25 minutes  Darriel Sinquefield, [facility] Pager [phone]. If 7PM-7AM, please contact night-coverage at [email or web address], password [facility] 06/30/2013, 3:38 PM  LOS: 4 days

## 2013-06-30 NOTE — H&P (View-Only) (Signed)
Patient ID: Charles Manning, male   DOB: 07/24/1955, 58 y.o.   MRN: [id number]  Patient without complaint. I reviewed chart and discussed case with Dr. Matilde Sprang and Dr. Dalbert Batman.   Danley Danker Vitals:   06/29/13 0902  BP:   Pulse:   Temp:   Resp: 20   Physical exam: Patient in no acute distress Scrotum is packed, subcutaneous tissue looks viable, the edge of the scrotal incision especially on the right may be necrotic.  Impression - POD #1. Debridement right inguinal area and scrotal area for complex soft-tissue infection Plan- Given deep scrotal packing and possible need for debridement I discussed with the patient the nature risks benefits and alternatives to exam under anesthesia, dressing changes, possible scrotal debridement tomorrow in the OR. He elects to proceed.

## 2013-06-30 NOTE — Anesthesia Postprocedure Evaluation (Signed)
  Anesthesia Post-op Note  Patient: Charles Manning  Procedure(s) Performed: Procedure(s) (LRB): SCROTUM DEBRIDEMENT (N/A)  Patient Location: PACU  Anesthesia Type: General  Level of Consciousness: awake and alert   Airway and Oxygen Therapy: Patient Spontanous Breathing  Post-op Pain: mild  Post-op Assessment: Post-op Vital signs reviewed, Patient's Cardiovascular Status Stable, Respiratory Function Stable, Patent Airway and No signs of Nausea or vomiting  Last Vitals:  Filed Vitals:   06/30/13 1035  BP: 137/63  Pulse: 82  Temp: 37.1 C  Resp: 19    Post-op Vital Signs: stable   Complications: No apparent anesthesia complications

## 2013-06-30 NOTE — Op Note (Addendum)
Preoperative diagnosis: Complex soft tissue infection involving the right inguinal region and right scrotal tissues, right epididymoorchitis Postoperative diagnosis: Same  Procedure: Exam under anesthesia Right scrotal debridement Scrotal reconstruction  Surgeon: Junious Silk Anesthesia: Gen.  Findings: On exam under anesthesia the right inguinal wound appeared healthy and well granulating., Several centimeters of the right scrotum and dartos fascia appeared necrotic. The right testicle was enlarged but no focal mass. The left testicles palpably normal. The tunica vaginalis layer appeared healthy bilaterally. On digital rectal exam the prostate was smooth and benign without enlargement, hard area or nodule. The rectum was palpably normal.  Description of procedure: After consent was obtained patient brought the operating room. After adequate anesthesia he is placed in lithotomy position prepped and draped in the usual sterile fashion the packing was removed. I remove the Foley catheter. Several centimeters of the right scrotal skin appeared necrotic and clearly demarcated. The scrotum had been bivalved. The underlying dartos fascia was also necrotic. Incision into the right tunica vaginalis was noted to be normal with normal bleeding. A reactive hydrocele was drained by incising the right tunica vaginalis. Some of the dartos fascia was debrided but most of this appeared normal. Probing with a finger up to and adjacent to the right inguinal incision noted there to be no further fluid collections. I incised the right scrotal skin and there was no bleeding. Incision with a #10 blade was made back to the demarcation where normal bleeding was seen. The right scrotal skin was debrided. 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  The scrotal and inguinal wounds were Pulsavac with 2 L.  Therefore reconstructive of the scrotum by reapproximating the posterior scrotum with interrupted 2-0 nylon suture. This created a posterior foundation for the testicles and I reapproximated the scrotal septum and testicles together with interrupted 2-0 Vicryl to hold them together in place. The anterior portion of the testicles was then covered with wet-to-dry for granulation.  The right inguinal incision was packed with a wet to dry.  The patient was cleaned up. I placed a new 16 French Foley catheter. Fluffs ABDs and mesh underwear was placed. He was awakened taken to recovery room in stable condition.  Blood loss: Minimal Complications: None Drains: None Specimens: None  Disposition: Patient stable to PACU

## 2013-06-30 NOTE — Preoperative (Signed)
Beta Blockers   Reason not to administer Beta Blockers:Not Applicable 

## 2013-06-30 NOTE — Interval H&P Note (Signed)
History and Physical Interval Note:  06/30/2013 7:34 AM  Charles Manning  has presented today for surgery, with the diagnosis of SOFT TISSUE INFECTION  The various methods of treatment have been discussed with the patient and family. After consideration of risks, benefits and other options for treatment, the patient has consented to  Procedure(s): SCROTUM DEBRIDEMENT (N/A) as a surgical intervention .  The patient's history has been reviewed, patient examined, no change in status, stable for surgery.  I have reviewed the patient's chart and labs.  Questions were answered to the patient's satisfaction.  Discussed nature, r/b/a to debridement and possible orchiectomy. He elects to proceed.    [name]

## 2013-07-01 LAB — GLUCOSE, CAPILLARY
GLUCOSE-CAPILLARY: 191 mg/dL — AB (ref 70–99)
GLUCOSE-CAPILLARY: 267 mg/dL — AB (ref 70–99)
GLUCOSE-CAPILLARY: 166 mg/dL — AB (ref 70–99)
Glucose-Capillary: 71 mg/dL (ref 70–99)
Glucose-Capillary: 135 mg/dL — ABNORMAL HIGH (ref 70–99)

## 2013-07-01 LAB — CULTURE, ROUTINE-ABSCESS

## 2013-07-01 MED ORDER — INSULIN GLARGINE 100 UNIT/ML ~~LOC~~ SOLN
30.0000 [IU] | Freq: Two times a day (BID) | SUBCUTANEOUS | Status: DC
  Administered 2013-07-01 – 2013-07-07 (×12): 30 [IU] via SUBCUTANEOUS
  Filled 2013-07-01 (×14): qty 0.3

## 2013-07-01 MED ORDER — INSULIN ASPART 100 UNIT/ML ~~LOC~~ SOLN
8.0000 [IU] | Freq: Three times a day (TID) | SUBCUTANEOUS | Status: DC
  Administered 2013-07-01 – 2013-07-05 (×9): 8 [IU] via SUBCUTANEOUS

## 2013-07-01 NOTE — Progress Notes (Signed)
Patient ID: Charles Manning, male   DOB: May 10, 1956, 58 y.o.   MRN: [id number]         [facility]    Date of Admission:  06/26/2013   Total days of antibiotics 6         Active Problems:   Diabetes mellitus type 2 with complications, uncontrolled   Anxiety   Chronic pain syndrome   Cellulitis of groin, right   Cellulitis   Epididymitis   . citalopram  10 mg Oral Daily  . diphenhydrAMINE  25-50 mg Intravenous Q8H  . enoxaparin (LOVENOX) injection  40 mg Subcutaneous Q24H  . fenofibrate  160 mg Oral Daily  . HYDROmorphone PCA 0.3 mg/mL   Intravenous Q4H  . insulin aspart  0-15 Units Subcutaneous TID WC  . insulin aspart  8 Units Subcutaneous TID WC  . insulin glargine  30 Units Subcutaneous BID  . linezolid  600 mg Intravenous Q12H  . lisinopril  10 mg Oral Daily  . nicotine  21 mg Transdermal Daily  . piperacillin-tazobactam (ZOSYN)  IV  3.375 g Intravenous Q8H  . QUEtiapine Fumarate  150 mg Oral QHS    Objective: Temp:  [97.7 F (36.5 C)-100.4 F (38 C)] 98.5 F (36.9 C) (01/24 0800) Pulse Rate:  [65-102] 69 (01/24 1000) Resp:  [10-25] 20 (01/24 1000) BP: (97-134)/(54-98) 104/67 mmHg (01/24 1000) SpO2:  [90 %-100 %] 98 % (01/24 1000)  Lab Results Lab Results  Component Value Date   WBC 21.3* 06/29/2013   HGB 12.9* 06/29/2013   HCT 37.4* 06/29/2013   MCV 88.4 06/29/2013   PLT 318 06/29/2013     Microbiology: Recent Results (from the past 240 hour(s))  GC/CHLAMYDIA PROBE AMP     Status: None   Collection Time    06/28/13  7:17 AM      Result Value Range Status   CT Probe RNA NEGATIVE  NEGATIVE Final   GC Probe RNA NEGATIVE  NEGATIVE Final   Comment: (NOTE)                                                                                               **Normal Reference Range: Negative**          Assay performed using the Gen-Probe APTIMA COMBO2 (R) Assay.     Acceptable specimen types for this assay include APTIMA Swabs (Unisex,   endocervical, urethral, or vaginal), first void urine, and ThinPrep     liquid based cytology samples.     Performed at [place] [facility]     Status: None   Collection Time    06/28/13  7:06 PM      Result Value Range Status   MRSA, PCR NEGATIVE  NEGATIVE Final   Staphylococcus aureus NEGATIVE  NEGATIVE Final   Comment:            The Xpert SA Assay (FDA     approved for NASAL specimens     in patients over 40 years of age),     is one component of  a comprehensive surveillance     program.  Test performance has     been validated by [facility] for patients greater     than or equal to 48 year old.     It is not intended     to diagnose infection nor to     guide or monitor treatment.  CULTURE, ROUTINE-ABSCESS     Status: None   Collection Time    06/28/13  8:11 PM      Result Value Range Status   Specimen Description ABSCESS SCROTUM   Final   Special Requests PATIENT ON FOLLOWING LEVIQUIN DOXYCYCLINE   Final   Gram Stain     Final   Value: ABUNDANT WBC PRESENT, PREDOMINANTLY PMN     NO SQUAMOUS EPITHELIAL CELLS SEEN     MODERATE GRAM POSITIVE COCCI IN PAIRS     Performed by [facility] Gram Stain Report Called to,Read Back By and Verified With: Gram Stain Report Called to,Read Back By and Verified With:  W ALLRED RN AT 2053 ON 06/28/13 A NAVARRO     Performed at [facility]   Culture     Final   Value: MODERATE GROUP B STREP(S.AGALACTIAE)ISOLATED     Note: TESTING AGAINST S. AGALACTIAE NOT ROUTINELY PERFORMED DUE TO PREDICTABILITY OF AMP/PEN/VAN SUSCEPTIBILITY.     Performed at [facility]   Report Status 07/01/2013 FINAL   Final  ANAEROBIC CULTURE     Status: None   Collection Time    06/28/13  8:11 PM      Result Value Range Status   Specimen Description ABSCESS SCROTUM   Final   Special Requests PATIENT ON FOLLOWING LEVIQUIN DOXYCYCLINE   Final   Gram Stain     Final   Value: NO WBC SEEN     NO SQUAMOUS  EPITHELIAL CELLS SEEN     MODERATE GRAM POSITIVE COCCI IN PAIRS     Performed at [facility]   Culture     Final   Value: NO ANAEROBES ISOLATED; CULTURE IN PROGRESS FOR 5 DAYS     Performed at [facility]   Report Status PENDING   Incomplete  GRAM STAIN     Status: None   Collection Time    06/28/13  8:11 PM      Result Value Range Status   Specimen Description ABSCESS SCROTUM   Final   Special Requests PATIENT ON FOLLOWING LEVIQUIN DOXYCYCLINE   Final   Gram Stain     Final   Value: NO WBC SEEN     MODERATE GRAM POSITIVE COCCI IN PAIRS     Gram Stain Report Called to,Read Back By and Verified With: Carrolyn Meiers RN 2053 06/28/13 A NAVARRO   Report Status 06/28/2013 FINAL   Final    Assessment: Operative cultures have grown group B strep. I will discontinue piperacillin tazobactam and treat with linezolid alone.  Plan: 1. Continue linezolid 2. Discontinue piperacillin tazobactam  Michel Bickers, MD [facility] [phone] pager   [phone] cell 07/01/2013, 11:02 AM

## 2013-07-01 NOTE — Progress Notes (Signed)
1 Day Post-Op Subjective: Patient reports Mild scrotal pain.  Objective: Vital signs in last 24 hours: Temp:  [97.7 F (36.5 C)-100.4 F (38 C)] 98.5 F (36.9 C) (01/24 0800) Pulse Rate:  [65-102] 72 (01/24 0600) Resp:  [10-25] 12 (01/24 0916) BP: (97-137)/(54-98) 110/65 mmHg (01/24 0600) SpO2:  [90 %-100 %] 92 % (01/24 0916)  Intake/Output from previous day: 01/23 0701 - 01/24 0700 In: 2350 [I.V.:1600; IV Piggyback:750] Out: 1985 [Urine:1985] Intake/Output this shift:    Physical Exam:  General: Sleepy but arousable GI: Abdomen  Soft, non distended. Foley draining clear urine Right scrotal wound: pink with some exudate. Mild ecchymotic areas left scrotum  Lab Results:  Recent Labs  06/29/13 0335  HGB 12.9*  HCT 37.4*   BMET  Recent Labs  06/29/13 0335  NA 130*  K 4.8  CL 97  CO2 22  GLUCOSE 300*  BUN 22  CREATININE 1.01  CALCIUM 7.9*    Recent Labs  06/28/13 1903  INR 1.10   No results found for this basename: LABURIN,  in the last 72 hours Results for orders placed during the hospital encounter of 06/26/13  GC/CHLAMYDIA PROBE AMP     Status: None   Collection Time    06/28/13  7:17 AM      Result Value Range Status   CT Probe RNA NEGATIVE  NEGATIVE Final   GC Probe RNA NEGATIVE  NEGATIVE Final   Comment: (NOTE)                                                                                               **Normal Reference Range: Negative**          Assay performed using the Gen-Probe APTIMA COMBO2 (R) Assay.     Acceptable specimen types for this assay include APTIMA Swabs (Unisex,     endocervical, urethral, or vaginal), first void urine, and ThinPrep     liquid based cytology samples.     Performed at [facility]     Status: None   Collection Time    06/28/13  7:06 PM      Result Value Range Status   MRSA, PCR NEGATIVE  NEGATIVE Final   Staphylococcus aureus NEGATIVE  NEGATIVE Final   Comment:          The Xpert SA Assay (FDA     approved for NASAL specimens     in patients over 9 years of age),     is one component of     a comprehensive surveillance     program.  Test performance has     been validated by [employer] for patients greater     than or equal to 6 year old.     It is not intended     to diagnose infection nor to     guide or monitor treatment.  CULTURE, ROUTINE-ABSCESS     Status: None   Collection Time    06/28/13  8:11 PM      Result Value Range Status   Specimen Description  ABSCESS SCROTUM   Final   Special Requests PATIENT ON FOLLOWING LEVIQUIN DOXYCYCLINE   Final   Gram Stain     Final   Value: ABUNDANT WBC PRESENT, PREDOMINANTLY PMN     NO SQUAMOUS EPITHELIAL CELLS SEEN     MODERATE GRAM POSITIVE COCCI IN PAIRS     Performed by [facility] Gram Stain Report Called to,Read Back By and Verified With: Gram Stain Report Called to,Read Back By and Verified With:  W ALLRED RN AT 2053 ON 06/28/13 A NAVARRO     Performed at [facility]   Culture     Final   Value: MODERATE GROUP B STREP(S.AGALACTIAE)ISOLATED     Note: TESTING AGAINST S. AGALACTIAE NOT ROUTINELY PERFORMED DUE TO PREDICTABILITY OF AMP/PEN/VAN SUSCEPTIBILITY.     Performed at [facility]   Report Status 07/01/2013 FINAL   Final  ANAEROBIC CULTURE     Status: None   Collection Time    06/28/13  8:11 PM      Result Value Range Status   Specimen Description ABSCESS SCROTUM   Final   Special Requests PATIENT ON FOLLOWING LEVIQUIN DOXYCYCLINE   Final   Gram Stain     Final   Value: NO WBC SEEN     NO SQUAMOUS EPITHELIAL CELLS SEEN     MODERATE GRAM POSITIVE COCCI IN PAIRS     Performed at [facility]   Culture     Final   Value: NO ANAEROBES ISOLATED; CULTURE IN PROGRESS FOR 5 DAYS     Performed at [facility]   Report Status PENDING   Incomplete  GRAM STAIN     Status: None   Collection Time    06/28/13  8:11 PM      Result Value Range Status    Specimen Description ABSCESS SCROTUM   Final   Special Requests PATIENT ON FOLLOWING LEVIQUIN DOXYCYCLINE   Final   Gram Stain     Final   Value: NO WBC SEEN     MODERATE GRAM POSITIVE COCCI IN PAIRS     Gram Stain Report Called to,Read Back By and Verified With: Carrolyn Meiers RN 2053 06/28/13 A NAVARRO   Report Status 06/28/2013 FINAL   Final    Studies/Results: No results found.  Assessment/Plan:  Fournier's gangrene  Pulse lavage by wound care team  Watch for more necrosis that would require more debridement   LOS: 5 days   Hanley Ben 07/01/2013, 9:41 AM

## 2013-07-01 NOTE — Progress Notes (Signed)
[facility] PROGRESS NOTE  Charles Manning STM:[id number] DOB: 1955/11/28 DOA: 06/26/2013 PCP: Benito Mccreedy, MD   Assessment/Plan: Right epididymo-orchitis - Initially started on empiric coverage with IV clindamycin and doxycycline. - Per ID recs, on zosyn and linezolid - Appreciate urology and general surgery involvement - Pt is s/p i/d of abscess and scrotal debridement - continue IV fluids and prn antiemetics.  uncontrolled DM - Initially persistent hyperglycemia overnight with fsg in 500s with AG of 15.  - Was placed on glucose stabilizer. Now improved and AG closed.  - Overnight glucose in the mid 100's with mid 200's this AM - Monitor for now. May need to increase lantus further - diabetic coordinator following  Depression  -continue celexa and seroquel    Hyponatremia  -due to hyperglycemia and dehydration continue fluids and tight blood glucose control  DVT proph: lovenox  Diet: diabetic   Code Status: full code Family Communication: none at bedside Disposition Plan: home once improved   Consultants:  Urology  General surgery  ID   Procedure:   none   Antibiotics:  IV clinda ( 1/19>>06/27/13)  IV doxycycline 1/191/5>>>06/30/13  IV levaquin ( 1/20>>06/30/13)  Linezolid 06/30/13>>>  Zosyn 06/29/13>>>  HPI/Subjective: No complaints. No overnight events notesd  Objective: Filed Vitals:   07/01/13 0650  BP:   Pulse:   Temp:   Resp: 12    Intake/Output Summary (Last 24 hours) at 07/01/13 0759 Last data filed at 07/01/13 0600  Gross per 24 hour  Intake   2350 ml  Output   1760 ml  Net    590 ml   Filed Weights   06/26/13 2300 06/28/13 2300  Weight: 88.3 kg (194 lb 10.7 oz) 96.2 kg (212 lb 1.3 oz)    Exam:   General:  Middle aged male lying in bed appears sleepy and drowsy  HEENT: no Pallor, dry oral mucosa   chest: clear b/l, no added sounds  CVS: NS1&S2,, no MRG  Abd: soft, NT, ND, BS+, swollen scrotum with  erythema, no discharge. Warmth and  Tender to palaption  Ext: warm, no edema   CNS: sleepy , awake to commands and oriented  Data Reviewed: Basic Metabolic Panel:  Recent Labs Lab 06/26/13 1445 06/26/13 2330 06/27/13 0351 06/28/13 0555 06/29/13 0335  NA 129* 127* 128* 130* 130*  K 5.2 4.8 3.9 4.4 4.8  CL 93* 92* 97 95* 97  CO2 21 20 20 20 22   GLUCOSE 526* 392* 282* 310* 300*  BUN 26* 21 17 20 22   CREATININE 0.96 0.85 0.76 0.77 1.01  CALCIUM 9.2 8.6 7.8* 8.2* 7.9*   Liver Function Tests:  Recent Labs Lab 06/24/13 2223  AST 72*  ALT 97*  ALKPHOS 81  BILITOT 0.5  PROT 7.0  ALBUMIN 3.5   No results found for this basename: LIPASE, AMYLASE,  in the last 168 hours No results found for this basename: AMMONIA,  in the last 168 hours CBC:  Recent Labs Lab 06/24/13 2223 06/26/13 1445 06/27/13 0357 06/28/13 0555 06/29/13 0335  WBC 17.8* 23.0* 22.3* 21.0* 21.3*  NEUTROABS 12.2*  --   --  16.8*  --   HGB 15.8 15.2 13.0 12.6* 12.9*  HCT 43.4 43.5 37.3* 36.2* 37.4*  MCV 86.1 89.1 88.0 88.1 88.4  PLT 281 260 253 271 318   Cardiac Enzymes:  Recent Labs Lab 06/24/13 2223  CKTOTAL 109   BNP (last 3 results) No results found for this basename: PROBNP,  in the last 8760 hours CBG:  Recent Labs Lab 06/29/13 2124 06/30/13 0707 06/30/13 0942 06/30/13 1203 06/30/13 1639  GLUCAP 83 115* 148* 156* 232*    Recent Results (from the past 240 hour(s))  GC/CHLAMYDIA PROBE AMP     Status: None   Collection Time    06/28/13  7:17 AM      Result Value Range Status   CT Probe RNA NEGATIVE  NEGATIVE Final   GC Probe RNA NEGATIVE  NEGATIVE Final   Comment: (NOTE)                                                                                               **Normal Reference Range: Negative**          Assay performed using the Gen-Probe APTIMA COMBO2 (R) Assay.     Acceptable specimen types for this assay include APTIMA Swabs (Unisex,     endocervical, urethral, or  vaginal), first void urine, and ThinPrep     liquid based cytology samples.     Performed at [place]     Status: None   Collection Time    06/28/13  7:06 PM      Result Value Range Status   MRSA, PCR NEGATIVE  NEGATIVE Final   Staphylococcus aureus NEGATIVE  NEGATIVE Final   Comment:            The Xpert SA Assay (FDA     approved for NASAL specimens     in patients over 32 years of age),     is one component of     a comprehensive surveillance     program.  Test performance has     been validated by [employer] for patients greater     than or equal to 82 year old.     It is not intended     to diagnose infection nor to     guide or monitor treatment.  CULTURE, ROUTINE-ABSCESS     Status: None   Collection Time    06/28/13  8:11 PM      Result Value Range Status   Specimen Description ABSCESS SCROTUM   Final   Special Requests PATIENT ON FOLLOWING LEVIQUIN DOXYCYCLINE   Final   Gram Stain     Final   Value: ABUNDANT WBC PRESENT, PREDOMINANTLY PMN     NO SQUAMOUS EPITHELIAL CELLS SEEN     MODERATE GRAM POSITIVE COCCI IN PAIRS     Performed by [facility] Gram Stain Report Called to,Read Back By and Verified With: Gram Stain Report Called to,Read Back By and Verified With:  W ALLRED RN AT 2053 ON 06/28/13 A NAVARRO     Performed at [facility]   Culture     Final   Value: MODERATE GROUP B STREP(S.AGALACTIAE)ISOLATED     Note: TESTING AGAINST S. AGALACTIAE NOT ROUTINELY PERFORMED DUE TO PREDICTABILITY OF AMP/PEN/VAN SUSCEPTIBILITY.     Performed at [facility]   Report Status PENDING   Incomplete  ANAEROBIC CULTURE     Status: None   Collection Time  06/28/13  8:11 PM      Result Value Range Status   Specimen Description ABSCESS SCROTUM   Final   Special Requests PATIENT ON FOLLOWING LEVIQUIN DOXYCYCLINE   Final   Gram Stain     Final   Value: NO WBC SEEN     NO SQUAMOUS EPITHELIAL CELLS SEEN     MODERATE  GRAM POSITIVE COCCI IN PAIRS     Performed at [facility]   Culture     Final   Value: NO ANAEROBES ISOLATED; CULTURE IN PROGRESS FOR 5 DAYS     Performed at [facility]   Report Status PENDING   Incomplete  GRAM STAIN     Status: None   Collection Time    06/28/13  8:11 PM      Result Value Range Status   Specimen Description ABSCESS SCROTUM   Final   Special Requests PATIENT ON FOLLOWING LEVIQUIN DOXYCYCLINE   Final   Gram Stain     Final   Value: NO WBC SEEN     MODERATE GRAM POSITIVE COCCI IN PAIRS     Gram Stain Report Called to,Read Back By and Verified With: Carrolyn Meiers RN 2053 06/28/13 A NAVARRO   Report Status 06/28/2013 FINAL   Final     Studies: No results found.  Scheduled Meds: . citalopram  10 mg Oral Daily  . diphenhydrAMINE  25-50 mg Intravenous Q8H  . enoxaparin (LOVENOX) injection  40 mg Subcutaneous Q24H  . fenofibrate  160 mg Oral Daily  . HYDROmorphone PCA 0.3 mg/mL   Intravenous Q4H  . insulin aspart  0-15 Units Subcutaneous TID WC  . insulin aspart  5 Units Subcutaneous TID WC  . insulin glargine  50 Units Subcutaneous Daily  . linezolid  600 mg Intravenous Q12H  . lisinopril  10 mg Oral Daily  . nicotine  21 mg Transdermal Daily  . piperacillin-tazobactam (ZOSYN)  IV  3.375 g Intravenous Q8H  . QUEtiapine Fumarate  150 mg Oral QHS   Continuous Infusions: . dextrose 5 % and 0.45% NaCl 100 mL (06/30/13 0546)    Time spent: 25 minutes  Tylia Ewell, [facility] Pager [phone]. If 7PM-7AM, please contact night-coverage at [email or web address], password [facility] 07/01/2013, 7:59 AM  LOS: 5 days

## 2013-07-01 NOTE — Progress Notes (Signed)
1 Day Post-Op  Subjective:  Afebrile. No tachycardia. Pain is a little better. No bleeding from wounds.  Cultures: Group B strep  Objective: Vital signs in last 24 hours: Temp:  [97.7 F (36.5 C)-100.4 F (38 C)] 97.7 F (36.5 C) (01/24 0400) Pulse Rate:  [65-102] 72 (01/24 0600) Resp:  [10-25] 12 (01/24 0650) BP: (97-156)/(54-98) 110/65 mmHg (01/24 0600) SpO2:  [90 %-100 %] 100 % (01/24 0650) Last BM Date: 06/28/13  Intake/Output from previous day: 01/23 0701 - 01/24 0700 In: 2350 [I.V.:1600; IV Piggyback:750] Out: 1985 [Urine:1985] Intake/Output this shift:    EXAM: General appearance: alert.  does not appear to be in any distress. more cooperative. Not agitated. Male genitalia:right inguinal incision packed. Seems clean.  induration and tenderness throughout.  Lab Results:  Results for orders placed during the hospital encounter of 06/26/13 (from the past 24 hour(s))  GLUCOSE, CAPILLARY     Status: Abnormal   Collection Time    06/30/13  9:42 AM      Result Value Range   Glucose-Capillary 148 (*) 70 - 99 mg/dL  GLUCOSE, CAPILLARY     Status: Abnormal   Collection Time    06/30/13 12:03 PM      Result Value Range   Glucose-Capillary 156 (*) 70 - 99 mg/dL   Comment 1 Documented in Chart     Comment 2 Notify RN    GLUCOSE, CAPILLARY     Status: Abnormal   Collection Time    06/30/13  4:39 PM      Result Value Range   Glucose-Capillary 232 (*) 70 - 99 mg/dL     Studies/Results:   . citalopram  10 mg Oral Daily  . diphenhydrAMINE  25-50 mg Intravenous Q8H  . enoxaparin (LOVENOX) injection  40 mg Subcutaneous Q24H  . fenofibrate  160 mg Oral Daily  . HYDROmorphone PCA 0.3 mg/mL   Intravenous Q4H  . insulin aspart  0-15 Units Subcutaneous TID WC  . insulin aspart  5 Units Subcutaneous TID WC  . insulin glargine  50 Units Subcutaneous Daily  . linezolid  600 mg Intravenous Q12H  . lisinopril  10 mg Oral Daily  . nicotine  21 mg Transdermal Daily  .  piperacillin-tazobactam (ZOSYN)  IV  3.375 g Intravenous Q8H  . QUEtiapine Fumarate  150 mg Oral QHS     Assessment/Plan: s/p Procedure(s): INCISION AND DRAINAGE SCROTAL ABSCESS  S/P debridement right inguinal area and scrotal area for complex soft-tissue infection  cultures growing group B strep Continue antibiotics per ID recs  Diabetes mellitus. Control  Tobacco use 40 years  History right thigh dissection the melanoma resection 2005 excellent anxiety and depression  DJD. Suspect mobility is impaired.  Anxiety and depression    LOS: 5 days    Aiyonna Lucado C. 1/61/0960

## 2013-07-02 DIAGNOSIS — I10 Essential (primary) hypertension: Secondary | ICD-10-CM

## 2013-07-02 LAB — GLUCOSE, CAPILLARY
GLUCOSE-CAPILLARY: 91 mg/dL (ref 70–99)
Glucose-Capillary: 300 mg/dL — ABNORMAL HIGH (ref 70–99)
Glucose-Capillary: 116 mg/dL — ABNORMAL HIGH (ref 70–99)
Glucose-Capillary: 203 mg/dL — ABNORMAL HIGH (ref 70–99)

## 2013-07-02 LAB — CBC
HCT: 34.8 % — ABNORMAL LOW (ref 39.0–52.0)
HEMOGLOBIN: 11.8 g/dL — AB (ref 13.0–17.0)
MCH: 30.6 pg (ref 26.0–34.0)
MCHC: 33.9 g/dL (ref 30.0–36.0)
MCV: 90.2 fL (ref 78.0–100.0)
PLATELETS: 365 10*3/uL (ref 150–400)
RBC: 3.86 MIL/uL — ABNORMAL LOW (ref 4.22–5.81)
RDW: 12.2 % (ref 11.5–15.5)
WBC: 14 10*3/uL — ABNORMAL HIGH (ref 4.0–10.5)

## 2013-07-02 MED ORDER — AMPICILLIN-SULBACTAM SODIUM 1.5 (1-0.5) G IJ SOLR
1.5000 g | Freq: Four times a day (QID) | INTRAMUSCULAR | Status: DC
  Administered 2013-07-02 – 2013-07-06 (×17): 1.5 g via INTRAVENOUS
  Filled 2013-07-02 (×21): qty 1.5

## 2013-07-02 MED ORDER — CITALOPRAM HYDROBROMIDE 20 MG PO TABS
20.0000 mg | ORAL_TABLET | Freq: Every day | ORAL | Status: DC
  Administered 2013-07-03 – 2013-07-07 (×5): 20 mg via ORAL
  Filled 2013-07-02 (×5): qty 1

## 2013-07-02 NOTE — Evaluation (Signed)
Physical Therapy Evaluation Patient Details Name: Charles Manning MRN: [id number] DOB: 12-Nov-1955 Today's Date: 07/02/2013 Time: 6160-7371 PT Time Calculation (min): 33 min  PT Assessment / Plan / Recommendation History of Present Illness    Charles Manning is a 58 y.o. male with PMH of uncontrolled DM, chronic pain, HTN, depression, h/o melanoma resection from R groin in 2005, and chronic R leg lymphedema presents to the ER today with the above complaints.  He noticed pain and redness in the R groin 2 days ago, the pain and redness extended down to his scrotum.  He reports subjective fevers and chills. In ER, noted to have hyperglycemia, leukocytosis and [place] with R epididymitis vs cellulitis.  Scrotal debridement 07/02/13.   Clinical Impression  Pt cooperative and willing to attempt mobility.  At this time pt requires assist of 2 and increased time for performance of mobility tasks and is severely limited by pain with movement.  Pt will likely require follow up care and rehab at SNF level prior to return home with very limited assist.    PT Assessment  Patient needs continued PT services    Follow Up Recommendations  SNF    Does the patient have the potential to tolerate intense rehabilitation      Barriers to Discharge Decreased caregiver support Pt states home alone    Equipment Recommendations  Rolling walker with 5" wheels    Recommendations for Other Services OT consult   Frequency Min 3X/week    Precautions / Restrictions Precautions Precautions: Fall Precaution Comments: Pain with all movement esp attempting to step backward Restrictions Weight Bearing Restrictions: No   Pertinent Vitals/Pain 10/10; PCA used multiple times.  RN and DR aware.      Mobility  Bed Mobility Overal bed mobility: +2 for physical assistance General bed mobility comments: HOB elevated.  Pt moving in small increments and one LE at a time to avoid groin pain Transfers Overall transfer level:  Needs assistance Equipment used: Rolling walker (2 wheeled) Transfers: Sit to/from Stand Sit to Stand: +2 physical assistance General transfer comment: Pt unable to fully scoot fwd to EOB 2* groin pain.  +2 assist to lift pt up and fwd from position seated well back on bed Ambulation/Gait Ambulation/Gait assistance: +2 physical assistance Ambulation Distance (Feet): 3 Feet Assistive device: Rolling walker (2 wheeled) Gait Pattern/deviations: Step-to pattern;Decreased step length - right;Decreased step length - left;Shuffle;Wide base of support;Trunk flexed Gait velocity: decr General Gait Details: Pt ambulated short distance - pain ltd.  Pt unable to step bkwd 2* pain.  Bed pulled in behind and pt assisted to sitting    Exercises     PT Diagnosis: Difficulty walking  PT Problem List: Decreased strength;Decreased range of motion;Decreased activity tolerance;Decreased mobility;Decreased knowledge of use of DME;Pain PT Treatment Interventions: DME instruction;Gait training;Stair training;Functional mobility training;Therapeutic activities;Therapeutic exercise;Patient/family education     PT Goals(Current goals can be found in the care plan section) Acute Rehab PT Goals Patient Stated Goal: Walk without pain PT Goal Formulation: With patient Time For Goal Achievement: 07/16/13 Potential to Achieve Goals: Fair  Visit Information  Last PT Received On: 07/02/13 Assistance Needed: +2       Prior Functioning  Home Living Family/patient expects to be discharged to:: Private residence Living Arrangements: Alone Available Help at Discharge: Available PRN/intermittently Type of Home: House Home Access: Stairs to enter [employer] of Steps: 4 Entrance Stairs-Rails: None Home Layout: One level Home Equipment: Crutches;Cane - single point Prior Function Level of  Independence: Independent;Independent with assistive device(s) Comments: Occasional use of cane 2* difficulties  with R LE Communication Communication: No difficulties    Cognition  Cognition Arousal/Alertness: Awake/alert Behavior During Therapy: WFL for tasks assessed/performed Overall Cognitive Status: Within Functional Limits for tasks assessed    Extremity/Trunk Assessment Upper Extremity Assessment Upper Extremity Assessment: RUE deficits/detail RUE Deficits / Details: Pt reports ltd use of shoulder.  States difficulty with anything behind back or head Lower Extremity Assessment Lower Extremity Assessment: Generalized weakness (Difficult to assess 2* groin pain with LE movement)   Balance    End of Session PT - End of Session Equipment Utilized During Treatment: Gait belt Activity Tolerance: Patient limited by pain Patient left: in bed;with call bell/phone within reach;with nursing/sitter in room Nurse Communication: Mobility status  GP     Paislie Tessler 07/02/2013, 5:15 PM

## 2013-07-02 NOTE — Progress Notes (Signed)
2 Days Post-Op Subjective: Patient reports Scrotal pain  Objective: Vital signs in last 24 hours: Temp:  [98 F (36.7 C)-99.3 F (37.4 C)] 98.6 F (37 C) (01/25 0400) Pulse Rate:  [57-87] 65 (01/25 0800) Resp:  [11-20] 13 (01/25 0800) BP: (95-131)/(50-86) 131/72 mmHg (01/25 0800) SpO2:  [91 %-100 %] 91 % (01/25 0800) Weight:  [99.8 kg (220 lb 0.3 oz)] 99.8 kg (220 lb 0.3 oz) (01/25 0400)  Intake/Output from previous day: 01/24 0701 - 01/25 0700 In: 3727 [P.O.:1200; I.V.:1927; IV Piggyback:600] Out: 1935 [UKGUR:4270] Intake/Output this shift: Total I/O In: 340 [P.O.:240; I.V.:100] Out: 600 [Urine:600]  Physical Exam:  General: Alert and oriented today Wound clean and pink.  No exudate this morning. WBC: 14.0  Lab Results:  Recent Labs  07/02/13 0350  HGB 11.8*  HCT 34.8*   BMET No results found for this basename: NA, K, CL, CO2, GLUCOSE, BUN, CREATININE, CALCIUM,  in the last 72 hours No results found for this basename: LABPT, INR,  in the last 72 hours No results found for this basename: LABURIN,  in the last 72 hours Results for orders placed during the hospital encounter of 06/26/13  GC/CHLAMYDIA PROBE AMP     Status: None   Collection Time    06/28/13  7:17 AM      Result Value Range Status   CT Probe RNA NEGATIVE  NEGATIVE Final   GC Probe RNA NEGATIVE  NEGATIVE Final   Comment: (NOTE)                                                                                               **Normal Reference Range: Negative**          Assay performed using the Gen-Probe APTIMA COMBO2 (R) Assay.     Acceptable specimen types for this assay include APTIMA Swabs (Unisex,     endocervical, urethral, or vaginal), first void urine, and ThinPrep     liquid based cytology samples.     Performed at [facility]     Status: None   Collection Time    06/28/13  7:06 PM      Result Value Range Status   MRSA, PCR NEGATIVE  NEGATIVE Final   Staphylococcus aureus NEGATIVE  NEGATIVE Final   Comment:            The Xpert SA Assay (FDA     approved for NASAL specimens     in patients over 29 years of age),     is one component of     a comprehensive surveillance     program.  Test performance has     been validated by [employer] for patients greater     than or equal to 42 year old.     It is not intended     to diagnose infection nor to     guide or monitor treatment.  CULTURE, ROUTINE-ABSCESS     Status: None   Collection Time    06/28/13  8:11 PM      Result Value  Range Status   Specimen Description ABSCESS SCROTUM   Final   Special Requests PATIENT ON FOLLOWING LEVIQUIN DOXYCYCLINE   Final   Gram Stain     Final   Value: ABUNDANT WBC PRESENT, PREDOMINANTLY PMN     NO SQUAMOUS EPITHELIAL CELLS SEEN     MODERATE GRAM POSITIVE COCCI IN PAIRS     Performed by [facility] Gram Stain Report Called to,Read Back By and Verified With: Gram Stain Report Called to,Read Back By and Verified With:  W ALLRED RN AT 2053 ON 06/28/13 A NAVARRO     Performed at [facility]   Culture     Final   Value: MODERATE GROUP B STREP(S.AGALACTIAE)ISOLATED     Note: TESTING AGAINST S. AGALACTIAE NOT ROUTINELY PERFORMED DUE TO PREDICTABILITY OF AMP/PEN/VAN SUSCEPTIBILITY.     Performed at [facility]   Report Status 07/01/2013 FINAL   Final  ANAEROBIC CULTURE     Status: None   Collection Time    06/28/13  8:11 PM      Result Value Range Status   Specimen Description ABSCESS SCROTUM   Final   Special Requests PATIENT ON FOLLOWING LEVIQUIN DOXYCYCLINE   Final   Gram Stain     Final   Value: NO WBC SEEN     NO SQUAMOUS EPITHELIAL CELLS SEEN     MODERATE GRAM POSITIVE COCCI IN PAIRS     Performed at [facility]   Culture     Final   Value: NO ANAEROBES ISOLATED; CULTURE IN PROGRESS FOR 5 DAYS     Performed at [facility]   Report Status PENDING   Incomplete  GRAM STAIN     Status: None    Collection Time    06/28/13  8:11 PM      Result Value Range Status   Specimen Description ABSCESS SCROTUM   Final   Special Requests PATIENT ON FOLLOWING LEVIQUIN DOXYCYCLINE   Final   Gram Stain     Final   Value: NO WBC SEEN     MODERATE GRAM POSITIVE COCCI IN PAIRS     Gram Stain Report Called to,Read Back By and Verified With: Carrolyn Meiers RN 2053 06/28/13 A NAVARRO   Report Status 06/28/2013 FINAL   Final    Studies/Results: No results found.  Assessment/Plan:  Fournier's gangrene  Diabetes  Continue wet to dry dressing  Linezolid as per ID  OK to transfer to floor   LOS: 6 days   Hanley Ben 07/02/2013, 8:25 AM

## 2013-07-02 NOTE — Progress Notes (Signed)
Subjective:  Afebrile. No tachycardia. Pain is a little better. No bleeding from wounds.  Cultures: Group B strep  Objective: Vital signs in last 24 hours: Temp:  [98 F (36.7 C)-99.3 F (37.4 C)] 98.6 F (37 C) (01/25 0400) Pulse Rate:  [57-87] 65 (01/25 0800) Resp:  [11-20] 13 (01/25 0800) BP: (95-131)/(50-86) 131/72 mmHg (01/25 0800) SpO2:  [91 %-100 %] 91 % (01/25 0800) Weight:  [220 lb 0.3 oz (99.8 kg)] 220 lb 0.3 oz (99.8 kg) (01/25 0400) Last BM Date: 06/28/13  Intake/Output from previous day: 01/24 0701 - 01/25 0700 In: 3727 [P.O.:1200; I.V.:1927; IV Piggyback:600] Out: 1935 [GYJEH:6314] Intake/Output this shift: Total I/O In: 340 [P.O.:240; I.V.:100] Out: 600 [Urine:600]  EXAM: General appearance: alert.  does not appear to be in any distress. more cooperative. Not agitated. Male genitalia:right inguinal incision packed. Seems clean.  induration and tenderness throughout.  Erythema stable  Lab Results:  Results for orders placed during the hospital encounter of 06/26/13 (from the past 24 hour(s))  GLUCOSE, CAPILLARY     Status: Abnormal   Collection Time    07/01/13 11:25 AM      Result Value Range   Glucose-Capillary 267 (*) 70 - 99 mg/dL   Comment 1 Documented in Chart     Comment 2 Notify RN    GLUCOSE, CAPILLARY     Status: Abnormal   Collection Time    07/01/13  3:58 PM      Result Value Range   Glucose-Capillary 166 (*) 70 - 99 mg/dL   Comment 1 Documented in Chart     Comment 2 Notify RN    GLUCOSE, CAPILLARY     Status: Abnormal   Collection Time    07/01/13  9:20 PM      Result Value Range   Glucose-Capillary 135 (*) 70 - 99 mg/dL  CBC     Status: Abnormal   Collection Time    07/02/13  3:50 AM      Result Value Range   WBC 14.0 (*) 4.0 - 10.5 K/uL   RBC 3.86 (*) 4.22 - 5.81 MIL/uL   Hemoglobin 11.8 (*) 13.0 - 17.0 g/dL   HCT 34.8 (*) 39.0 - 52.0 %   MCV 90.2  78.0 - 100.0 fL   MCH 30.6  26.0 - 34.0 pg   MCHC 33.9  30.0 - 36.0 g/dL   RDW 12.2  11.5 - 15.5 %   Platelets 365  150 - 400 K/uL     Studies/Results:   . citalopram  10 mg Oral Daily  . diphenhydrAMINE  25-50 mg Intravenous Q8H  . enoxaparin (LOVENOX) injection  40 mg Subcutaneous Q24H  . fenofibrate  160 mg Oral Daily  . HYDROmorphone PCA 0.3 mg/mL   Intravenous Q4H  . insulin aspart  0-15 Units Subcutaneous TID WC  . insulin aspart  8 Units Subcutaneous TID WC  . insulin glargine  30 Units Subcutaneous BID  . linezolid  600 mg Intravenous Q12H  . lisinopril  10 mg Oral Daily  . nicotine  21 mg Transdermal Daily  . QUEtiapine Fumarate  150 mg Oral QHS     Assessment/Plan: s/p Procedure(s): INCISION AND DRAINAGE SCROTAL ABSCESS  S/P debridement right inguinal area and scrotal area for complex soft-tissue infection  cultures growing group B strep Continue antibiotics per ID recs  Diabetes mellitus. Control  Tobacco use 40 years  History right thigh dissection the melanoma resection 2005 excellent anxiety and depression  DJD. Suspect mobility is  impaired.  Anxiety and depression    LOS: 6 days    Charles Mcwhirter C. 8/92/1194

## 2013-07-02 NOTE — Progress Notes (Signed)
Patient ID: Charles Manning, male   DOB: [birth year], 58 y.o.   MRN: [id number]         [facility]    Date of Admission:  06/26/2013   Total days of antibiotics 7         Active Problems:   Diabetes mellitus type 2 with complications, uncontrolled   Anxiety   Chronic pain syndrome   Cellulitis of groin, right   Cellulitis   Epididymitis   . citalopram  10 mg Oral Daily  . diphenhydrAMINE  25-50 mg Intravenous Q8H  . enoxaparin (LOVENOX) injection  40 mg Subcutaneous Q24H  . fenofibrate  160 mg Oral Daily  . HYDROmorphone PCA 0.3 mg/mL   Intravenous Q4H  . insulin aspart  0-15 Units Subcutaneous TID WC  . insulin aspart  8 Units Subcutaneous TID WC  . insulin glargine  30 Units Subcutaneous BID  . linezolid  600 mg Intravenous Q12H  . lisinopril  10 mg Oral Daily  . nicotine  21 mg Transdermal Daily  . QUEtiapine Fumarate  150 mg Oral QHS   Objective: Temp:  [98 F (36.7 C)-99.3 F (37.4 C)] 98.6 F (37 C) (01/25 0400) Pulse Rate:  [57-85] 65 (01/25 0800) Resp:  [11-19] 13 (01/25 0800) BP: (95-131)/(50-86) 131/72 mmHg (01/25 0800) SpO2:  [91 %-100 %] 91 % (01/25 0800) Weight:  [99.8 kg (220 lb 0.3 oz)] 99.8 kg (220 lb 0.3 oz) (01/25 0400)  Lab Results Lab Results  Component Value Date   WBC 14.0* 07/02/2013   HGB 11.8* 07/02/2013   HCT 34.8* 07/02/2013   MCV 90.2 07/02/2013   PLT 365 07/02/2013    Lab Results  Component Value Date   CREATININE 1.01 06/29/2013   BUN 22 06/29/2013   NA 130* 06/29/2013   K 4.8 06/29/2013   CL 97 06/29/2013   CO2 22 06/29/2013     Microbiology: Recent Results (from the past 240 hour(s))  GC/CHLAMYDIA PROBE AMP     Status: None   Collection Time    06/28/13  7:17 AM      Result Value Range Status   CT Probe RNA NEGATIVE  NEGATIVE Final   GC Probe RNA NEGATIVE  NEGATIVE Final   Comment: (NOTE)                                                                                               **Normal Reference  Range: Negative**          Assay performed using the Gen-Probe APTIMA COMBO2 (R) Assay.     Acceptable specimen types for this assay include APTIMA Swabs (Unisex,     endocervical, urethral, or vaginal), first void urine, and ThinPrep     liquid based cytology samples.     Performed at [place]     Status: None   Collection Time    06/28/13  7:06 PM      Result Value Range Status   MRSA, PCR NEGATIVE  NEGATIVE Final   Staphylococcus aureus NEGATIVE  NEGATIVE Final   Comment:  The Xpert SA Assay (FDA     approved for NASAL specimens     in patients over 61 years of age),     is one component of     a comprehensive surveillance     program.  Test performance has     been validated by [employer] for patients greater     than or equal to 76 year old.     It is not intended     to diagnose infection nor to     guide or monitor treatment.  CULTURE, ROUTINE-ABSCESS     Status: None   Collection Time    06/28/13  8:11 PM      Result Value Range Status   Specimen Description ABSCESS SCROTUM   Final   Special Requests PATIENT ON FOLLOWING LEVIQUIN DOXYCYCLINE   Final   Gram Stain     Final   Value: ABUNDANT WBC PRESENT, PREDOMINANTLY PMN     NO SQUAMOUS EPITHELIAL CELLS SEEN     MODERATE GRAM POSITIVE COCCI IN PAIRS     Performed by [facility] Gram Stain Report Called to,Read Back By and Verified With: Gram Stain Report Called to,Read Back By and Verified With:  W ALLRED RN AT 2053 ON 06/28/13 A NAVARRO     Performed at [facility]   Culture     Final   Value: MODERATE GROUP B STREP(S.AGALACTIAE)ISOLATED     Note: TESTING AGAINST S. AGALACTIAE NOT ROUTINELY PERFORMED DUE TO PREDICTABILITY OF AMP/PEN/VAN SUSCEPTIBILITY.     Performed at [facility]   Report Status 07/01/2013 FINAL   Final  ANAEROBIC CULTURE     Status: None   Collection Time    06/28/13  8:11 PM      Result Value Range Status   Specimen  Description ABSCESS SCROTUM   Final   Special Requests PATIENT ON FOLLOWING LEVIQUIN DOXYCYCLINE   Final   Gram Stain     Final   Value: NO WBC SEEN     NO SQUAMOUS EPITHELIAL CELLS SEEN     MODERATE GRAM POSITIVE COCCI IN PAIRS     Performed at [facility]   Culture     Final   Value: NO ANAEROBES ISOLATED; CULTURE IN PROGRESS FOR 5 DAYS     Performed at [facility]   Report Status PENDING   Incomplete  GRAM STAIN     Status: None   Collection Time    06/28/13  8:11 PM      Result Value Range Status   Specimen Description ABSCESS SCROTUM   Final   Special Requests PATIENT ON FOLLOWING LEVIQUIN DOXYCYCLINE   Final   Gram Stain     Final   Value: NO WBC SEEN     MODERATE GRAM POSITIVE COCCI IN PAIRS     Gram Stain Report Called to,Read Back By and Verified With: Carrolyn Meiers RN 2053 06/28/13 A NAVARRO   Report Status 06/28/2013 FINAL   Final   Assessment: Group B strep is the only organism isolated from operative cultures. Coverage for MRSA is no longer indicated so I will switch to IV Unasyn.  Plan: 1. Change linezolid to Unasyn  Michel Bickers, MD [facility] for [place] [phone] pager   [phone] cell 07/02/2013, 10:35 AM

## 2013-07-02 NOTE — Progress Notes (Signed)
[facility] PROGRESS NOTE  KASHTON MCARTOR KKX:[id number] DOB: [birth year] DOA: 06/26/2013 PCP: Benito Mccreedy, MD   Assessment/Plan: Right epididymo-orchitis/Fournier's gangrene - Initially started on empiric coverage with IV clindamycin and doxycycline. - Wound cx with GBS - Per ID recs, on linezolid, zosyn d/c'd 1/24 - Appreciate urology and general surgery involvement - Pt is s/p i/d of abscess and scrotal debridement - Wound care per Urology - continue IV fluids and prn antiemetics.  uncontrolled DM - Initially persistent hyperglycemia overnight with fsg in 500s with AG of 15.  - Was placed on glucose stabilizer. Now improved and AG closed.  - Overnight glucose in the mid 100's with mid 200's this AM - Monitor for now. May need to increase lantus further - diabetic coordinator following  Depression  -continue celexa and seroquel    Hyponatremia  -due to hyperglycemia and dehydration continue fluids and tight blood glucose control  DVT proph: lovenox  Diet: diabetic   Code Status: full code Family Communication: none at bedside Disposition Plan: home once improved   Consultants:  Urology  General surgery  ID   Procedure:   none   Antibiotics:  IV clinda ( 1/19>>06/27/13)  IV doxycycline 1/191/5>>>06/30/13  IV levaquin ( 1/20>>06/30/13)  Linezolid 06/30/13>>>  Zosyn 06/29/13>>>07/01/13  HPI/Subjective: No acute events noted overnight Objective: Filed Vitals:   07/02/13 0400  BP: 106/63  Pulse: 58  Temp: 98.6 F (37 C)  Resp: 14    Intake/Output Summary (Last 24 hours) at 07/02/13 0755 Last data filed at 07/02/13 0700  Gross per 24 hour  Intake   3727 ml  Output   1935 ml  Net   1792 ml   Filed Weights   06/26/13 2300 06/28/13 2300 07/02/13 0400  Weight: 88.3 kg (194 lb 10.7 oz) 96.2 kg (212 lb 1.3 oz) 99.8 kg (220 lb 0.3 oz)    Exam:   General:  Middle aged male lying in bed appears sleepy and drowsy  HEENT: no Pallor,  dry oral mucosa   chest: clear b/l, no added sounds  CVS: NS1&S2,, no MRG  Abd: soft, NT, ND, BS+, swollen scrotum with erythema, no discharge. Warmth and  Tender to palaption  Ext: warm, no edema  Pelvic/GU: scrotum over B testes removed with packing in place, tracking through 2 inguinal region   CNS: sleepy , awake to commands and oriented  Data Reviewed: Basic Metabolic Panel:  Recent Labs Lab 06/26/13 1445 06/26/13 2330 06/27/13 0351 06/28/13 0555 06/29/13 0335  NA 129* 127* 128* 130* 130*  K 5.2 4.8 3.9 4.4 4.8  CL 93* 92* 97 95* 97  CO2 21 20 20 20 22   GLUCOSE 526* 392* 282* 310* 300*  BUN 26* 21 17 20 22   CREATININE 0.96 0.85 0.76 0.77 1.01  CALCIUM 9.2 8.6 7.8* 8.2* 7.9*   Liver Function Tests: No results found for this basename: AST, ALT, ALKPHOS, BILITOT, PROT, ALBUMIN,  in the last 168 hours No results found for this basename: LIPASE, AMYLASE,  in the last 168 hours No results found for this basename: AMMONIA,  in the last 168 hours CBC:  Recent Labs Lab 06/26/13 1445 06/27/13 0357 06/28/13 0555 06/29/13 0335 07/02/13 0350  WBC 23.0* 22.3* 21.0* 21.3* 14.0*  NEUTROABS  --   --  16.8*  --   --   HGB 15.2 13.0 12.6* 12.9* 11.8*  HCT 43.5 37.3* 36.2* 37.4* 34.8*  MCV 89.1 88.0 88.1 88.4 90.2  PLT 260 253 271 318 365   Cardiac  Enzymes: No results found for this basename: CKTOTAL, CKMB, CKMBINDEX, TROPONINI,  in the last 168 hours BNP (last 3 results) No results found for this basename: PROBNP,  in the last 8760 hours CBG:  Recent Labs Lab 06/30/13 2209 07/01/13 0743 07/01/13 1125 07/01/13 1558 07/01/13 2120  GLUCAP 71 191* 267* 166* 135*    Recent Results (from the past 240 hour(s))  GC/CHLAMYDIA PROBE AMP     Status: None   Collection Time    06/28/13  7:17 AM      Result Value Range Status   CT Probe RNA NEGATIVE  NEGATIVE Final   GC Probe RNA NEGATIVE  NEGATIVE Final   Comment: (NOTE)                                                                                                **Normal Reference Range: Negative**          Assay performed using the Gen-Probe APTIMA COMBO2 (R) Assay.     Acceptable specimen types for this assay include APTIMA Swabs (Unisex,     endocervical, urethral, or vaginal), first void urine, and ThinPrep     liquid based cytology samples.     Performed at [place]     Status: None   Collection Time    06/28/13  7:06 PM      Result Value Range Status   MRSA, PCR NEGATIVE  NEGATIVE Final   Staphylococcus aureus NEGATIVE  NEGATIVE Final   Comment:            The Xpert SA Assay (FDA     approved for NASAL specimens     in patients over 56 years of age),     is one component of     a comprehensive surveillance     program.  Test performance has     been validated by [employer] for patients greater     than or equal to 48 year old.     It is not intended     to diagnose infection nor to     guide or monitor treatment.  CULTURE, ROUTINE-ABSCESS     Status: None   Collection Time    06/28/13  8:11 PM      Result Value Range Status   Specimen Description ABSCESS SCROTUM   Final   Special Requests PATIENT ON FOLLOWING LEVIQUIN DOXYCYCLINE   Final   Gram Stain     Final   Value: ABUNDANT WBC PRESENT, PREDOMINANTLY PMN     NO SQUAMOUS EPITHELIAL CELLS SEEN     MODERATE GRAM POSITIVE COCCI IN PAIRS     Performed by [facility] Gram Stain Report Called to,Read Back By and Verified With: Gram Stain Report Called to,Read Back By and Verified With:  W ALLRED RN AT 2053 ON 06/28/13 A NAVARRO     Performed at [facility]   Culture     Final   Value: MODERATE GROUP B STREP(S.AGALACTIAE)ISOLATED     Note: TESTING AGAINST S. AGALACTIAE NOT ROUTINELY PERFORMED  DUE TO PREDICTABILITY OF AMP/PEN/VAN SUSCEPTIBILITY.     Performed at [facility]   Report Status 07/01/2013 FINAL   Final  ANAEROBIC CULTURE     Status: None   Collection Time     06/28/13  8:11 PM      Result Value Range Status   Specimen Description ABSCESS SCROTUM   Final   Special Requests PATIENT ON FOLLOWING LEVIQUIN DOXYCYCLINE   Final   Gram Stain     Final   Value: NO WBC SEEN     NO SQUAMOUS EPITHELIAL CELLS SEEN     MODERATE GRAM POSITIVE COCCI IN PAIRS     Performed at [facility]   Culture     Final   Value: NO ANAEROBES ISOLATED; CULTURE IN PROGRESS FOR 5 DAYS     Performed at [facility]   Report Status PENDING   Incomplete  GRAM STAIN     Status: None   Collection Time    06/28/13  8:11 PM      Result Value Range Status   Specimen Description ABSCESS SCROTUM   Final   Special Requests PATIENT ON FOLLOWING LEVIQUIN DOXYCYCLINE   Final   Gram Stain     Final   Value: NO WBC SEEN     MODERATE GRAM POSITIVE COCCI IN PAIRS     Gram Stain Report Called to,Read Back By and Verified With: Carrolyn Meiers RN 2053 06/28/13 A NAVARRO   Report Status 06/28/2013 FINAL   Final     Studies: No results found.  Scheduled Meds: . citalopram  10 mg Oral Daily  . diphenhydrAMINE  25-50 mg Intravenous Q8H  . enoxaparin (LOVENOX) injection  40 mg Subcutaneous Q24H  . fenofibrate  160 mg Oral Daily  . HYDROmorphone PCA 0.3 mg/mL   Intravenous Q4H  . insulin aspart  0-15 Units Subcutaneous TID WC  . insulin aspart  8 Units Subcutaneous TID WC  . insulin glargine  30 Units Subcutaneous BID  . linezolid  600 mg Intravenous Q12H  . lisinopril  10 mg Oral Daily  . nicotine  21 mg Transdermal Daily  . QUEtiapine Fumarate  150 mg Oral QHS   Continuous Infusions: . dextrose 5 % and 0.45% NaCl 100 mL/hr at 07/02/13 0326    Time spent: 35 minutes  Tylesha Gibeault, [facility] Pager [phone]. If 7PM-7AM, please contact night-coverage at [email or web address], password [facility] 07/02/2013, 7:55 AM  LOS: 6 days

## 2013-07-03 ENCOUNTER — Encounter (HOSPITAL_COMMUNITY): Payer: Self-pay | Admitting: Urology

## 2013-07-03 DIAGNOSIS — B951 Streptococcus, group B, as the cause of diseases classified elsewhere: Secondary | ICD-10-CM

## 2013-07-03 LAB — BASIC METABOLIC PANEL
BUN: 7 mg/dL (ref 6–23)
CO2: 30 meq/L (ref 19–32)
Calcium: 8.4 mg/dL (ref 8.4–10.5)
Chloride: 100 mEq/L (ref 96–112)
Creatinine, Ser: 0.89 mg/dL (ref 0.50–1.35)
GFR calc Af Amer: >90 mL/min (ref >90)
GFR calc non Af Amer: >90 mL/min (ref >90)
Glucose, Bld: 103 mg/dL — ABNORMAL HIGH (ref 70–99)
Potassium: 4.2 mEq/L (ref 3.7–5.3)
SODIUM: 139 meq/L (ref 137–147)

## 2013-07-03 LAB — CBC
HCT: 33.9 % — ABNORMAL LOW (ref 39.0–52.0)
Hemoglobin: 11.2 g/dL — ABNORMAL LOW (ref 13.0–17.0)
MCH: 29.9 pg (ref 26.0–34.0)
MCHC: 33 g/dL (ref 30.0–36.0)
MCV: 90.4 fL (ref 78.0–100.0)
PLATELETS: 379 10*3/uL (ref 150–400)
RBC: 3.75 MIL/uL — AB (ref 4.22–5.81)
RDW: 12.2 % (ref 11.5–15.5)
WBC: 13.5 10*3/uL — AB (ref 4.0–10.5)

## 2013-07-03 LAB — GLUCOSE, CAPILLARY
GLUCOSE-CAPILLARY: 108 mg/dL — AB (ref 70–99)
GLUCOSE-CAPILLARY: 108 mg/dL — AB (ref 70–99)
Glucose-Capillary: 101 mg/dL — ABNORMAL HIGH (ref 70–99)
Glucose-Capillary: 135 mg/dL — ABNORMAL HIGH (ref 70–99)
Glucose-Capillary: 146 mg/dL — ABNORMAL HIGH (ref 70–99)
Glucose-Capillary: 191 mg/dL — ABNORMAL HIGH (ref 70–99)
Glucose-Capillary: 248 mg/dL — ABNORMAL HIGH (ref 70–99)
Glucose-Capillary: 90 mg/dL (ref 70–99)

## 2013-07-03 MED ORDER — HYDROMORPHONE HCL PF 2 MG/ML IJ SOLN
1.5000 mg | INTRAMUSCULAR | Status: DC | PRN
  Administered 2013-07-03 – 2013-07-05 (×6): 1.5 mg via INTRAVENOUS
  Filled 2013-07-03 (×6): qty 1

## 2013-07-03 NOTE — Progress Notes (Signed)
Patient tolerated dsg change well with use of break through morphine pain management and PCA dilaudid. Once comfortable in bed and asleep observed patient manipulating the dsg . Patient encouraged and educated to not unnecessarily touch area  due to increased risk of infection. Patient appears to like to sleep with hands in the genital area. Encourage patient to keep hands clean, gave personalized hand sanitizer. Patient need cont education of importance of not manipulating area. Will cont to monitor and redirect.

## 2013-07-03 NOTE — Progress Notes (Signed)
OT Cancellation Note  Patient Details Name: [name] MRN: [id number] DOB: November 24, 1955   Cancelled Treatment:    Reason Eval/Treat Not Completed: Pain limiting ability to participate. Informed nursing. Pt requests for OT to check back another time.  Jules Schick [phone] 07/03/2013, 1:09 PM

## 2013-07-03 NOTE — Progress Notes (Signed)
[facility] PROGRESS NOTE  Charles Manning GYJ:[id number] DOB: 11/24/1955 DOA: 06/26/2013 PCP: Benito Mccreedy, MD   Assessment/Plan: Right epididymo-orchitis/Fournier's gangrene - Initially started on empiric coverage with IV clindamycin and doxycycline. - Wound cx with GBS - Per ID recs, on linezolid, zosyn d/c'd 1/24 - Appreciate urology and general surgery involvement - Pt is s/p i/d of abscess and scrotal debridement - Wound care per Urology - continue IV fluids and prn antiemetics.  uncontrolled DM - Initially persistent hyperglycemia overnight with fsg in 500s with AG of 15.  - Was placed on glucose stabilizer. Now improved and AG closed.  - Overnight glucose in the mid 100's with mid 200's this AM - diabetic coordinator following - Glucose now much improved  Depression  -continue celexa and seroquel    Hyponatremia  -due to hyperglycemia and dehydration continue fluids and tight blood glucose control  DVT proph: lovenox  Diet: diabetic  Code Status: full code Family Communication: none at bedside Disposition Plan: home once improved  Consultants:  Urology  General surgery  ID   Procedure:   none   Antibiotics:  IV clinda ( 1/19>>06/27/13)  IV doxycycline 1/191/5>>>06/30/13  IV levaquin ( 1/20>>06/30/13)  Linezolid 06/30/13>>>07/03/13  Zosyn 06/29/13>>>07/01/13  Unasyn 07/03/13>>>  HPI/Subjective: No acute events noted overnight Objective: Filed Vitals:   07/03/13 1112  BP:   Pulse:   Temp:   Resp: 10    Intake/Output Summary (Last 24 hours) at 07/03/13 1229 Last data filed at 07/03/13 1200  Gross per 24 hour  Intake 3044.67 ml  Output   2450 ml  Net 594.67 ml   Filed Weights   06/26/13 2300 06/28/13 2300 07/02/13 0400  Weight: 88.3 kg (194 lb 10.7 oz) 96.2 kg (212 lb 1.3 oz) 99.8 kg (220 lb 0.3 oz)    Exam:   General:  Middle aged male lying in bed appears sleepy and drowsy  HEENT: no Pallor, dry oral mucosa   chest:  clear b/l, no added sounds  CVS: NS1&S2,, no MRG  Abd: soft, NT, ND, BS+, swollen scrotum with erythema, no discharge. Warmth and  Tender to palaption  Ext: warm, no edema  Pelvic/GU: scrotum over B testes removed with packing in place, tracking through 2 inguinal region   CNS: sleepy , awake to commands and oriented  Data Reviewed: Basic Metabolic Panel:  Recent Labs Lab 06/26/13 2330 06/27/13 0351 06/28/13 0555 06/29/13 0335 07/03/13 0507  NA 127* 128* 130* 130* 139  K 4.8 3.9 4.4 4.8 4.2  CL 92* 97 95* 97 100  CO2 20 20 20 22 30   GLUCOSE 392* 282* 310* 300* 103*  BUN 21 17 20 22 7   CREATININE 0.85 0.76 0.77 1.01 0.89  CALCIUM 8.6 7.8* 8.2* 7.9* 8.4   Liver Function Tests: No results found for this basename: AST, ALT, ALKPHOS, BILITOT, PROT, ALBUMIN,  in the last 168 hours No results found for this basename: LIPASE, AMYLASE,  in the last 168 hours No results found for this basename: AMMONIA,  in the last 168 hours CBC:  Recent Labs Lab 06/27/13 0357 06/28/13 0555 06/29/13 0335 07/02/13 0350 07/03/13 0507  WBC 22.3* 21.0* 21.3* 14.0* 13.5*  NEUTROABS  --  16.8*  --   --   --   HGB 13.0 12.6* 12.9* 11.8* 11.2*  HCT 37.3* 36.2* 37.4* 34.8* 33.9*  MCV 88.0 88.1 88.4 90.2 90.4  PLT 253 271 318 365 379   Cardiac Enzymes: No results found for this basename: CKTOTAL, CKMB, CKMBINDEX, TROPONINI,  in the last 168 hours BNP (last 3 results) No results found for this basename: PROBNP,  in the last 8760 hours CBG:  Recent Labs Lab 07/03/13 0042 07/03/13 0319 07/03/13 0605 07/03/13 0713 07/03/13 1103  GLUCAP 191* 108* 101* 90 146*    Recent Results (from the past 240 hour(s))  GC/CHLAMYDIA PROBE AMP     Status: None   Collection Time    06/28/13  7:17 AM      Result Value Range Status   CT Probe RNA NEGATIVE  NEGATIVE Final   GC Probe RNA NEGATIVE  NEGATIVE Final   Comment: (NOTE)                                                                                                **Normal Reference Range: Negative**          Assay performed using the Gen-Probe APTIMA COMBO2 (R) Assay.     Acceptable specimen types for this assay include APTIMA Swabs (Unisex,     endocervical, urethral, or vaginal), first void urine, and ThinPrep     liquid based cytology samples.     Performed at [place]     Status: None   Collection Time    06/28/13  7:06 PM      Result Value Range Status   MRSA, PCR NEGATIVE  NEGATIVE Final   Staphylococcus aureus NEGATIVE  NEGATIVE Final   Comment:            The Xpert SA Assay (FDA     approved for NASAL specimens     in patients over 56 years of age),     is one component of     a comprehensive surveillance     program.  Test performance has     been validated by [employer] for patients greater     than or equal to 39 year old.     It is not intended     to diagnose infection nor to     guide or monitor treatment.  CULTURE, ROUTINE-ABSCESS     Status: None   Collection Time    06/28/13  8:11 PM      Result Value Range Status   Specimen Description ABSCESS SCROTUM   Final   Special Requests PATIENT ON FOLLOWING LEVIQUIN DOXYCYCLINE   Final   Gram Stain     Final   Value: ABUNDANT WBC PRESENT, PREDOMINANTLY PMN     NO SQUAMOUS EPITHELIAL CELLS SEEN     MODERATE GRAM POSITIVE COCCI IN PAIRS     Performed by [facility] Gram Stain Report Called to,Read Back By and Verified With: Gram Stain Report Called to,Read Back By and Verified With:  W ALLRED RN AT 2053 ON 06/28/13 A NAVARRO     Performed at [facility]   Culture     Final   Value: MODERATE GROUP B STREP(S.AGALACTIAE)ISOLATED     Note: TESTING AGAINST S. AGALACTIAE NOT ROUTINELY PERFORMED DUE TO PREDICTABILITY OF AMP/PEN/VAN SUSCEPTIBILITY.     Performed at  [facility]   Report Status 07/01/2013 FINAL   Final  ANAEROBIC CULTURE     Status: None   Collection Time    06/28/13  8:11 PM       Result Value Range Status   Specimen Description ABSCESS SCROTUM   Final   Special Requests PATIENT ON FOLLOWING LEVIQUIN DOXYCYCLINE   Final   Gram Stain     Final   Value: NO WBC SEEN     NO SQUAMOUS EPITHELIAL CELLS SEEN     MODERATE GRAM POSITIVE COCCI IN PAIRS     Performed at [facility]   Culture     Final   Value: NO ANAEROBES ISOLATED; CULTURE IN PROGRESS FOR 5 DAYS     Performed at [facility]   Report Status PENDING   Incomplete  GRAM STAIN     Status: None   Collection Time    06/28/13  8:11 PM      Result Value Range Status   Specimen Description ABSCESS SCROTUM   Final   Special Requests PATIENT ON FOLLOWING LEVIQUIN DOXYCYCLINE   Final   Gram Stain     Final   Value: NO WBC SEEN     MODERATE GRAM POSITIVE COCCI IN PAIRS     Gram Stain Report Called to,Read Back By and Verified With: Carrolyn Meiers RN 2053 06/28/13 A NAVARRO   Report Status 06/28/2013 FINAL   Final     Studies: No results found.  Scheduled Meds: . ampicillin-sulbactam (UNASYN) IV  1.5 g Intravenous Q6H  . citalopram  20 mg Oral Daily  . diphenhydrAMINE  25-50 mg Intravenous Q8H  . enoxaparin (LOVENOX) injection  40 mg Subcutaneous Q24H  . fenofibrate  160 mg Oral Daily  . HYDROmorphone PCA 0.3 mg/mL   Intravenous Q4H  . insulin aspart  0-15 Units Subcutaneous TID WC  . insulin aspart  8 Units Subcutaneous TID WC  . insulin glargine  30 Units Subcutaneous BID  . lisinopril  10 mg Oral Daily  . nicotine  21 mg Transdermal Daily  . QUEtiapine Fumarate  150 mg Oral QHS   Continuous Infusions: . dextrose 5 % and 0.45% NaCl 1,000 mL (07/03/13 0046)    Time spent: 35 minutes  Temprence Rhines, [place] [facility] Pager [phone]. If 7PM-7AM, please contact night-coverage at [email or web address], password [facility] 07/03/2013, 12:29 PM  LOS: 7 days

## 2013-07-03 NOTE — Progress Notes (Signed)
Pt CBG=90 this AM. SSI and meal coverage insulin held. Lantus dose held last night. CBG=146 at 1100. Dr Wyline Copas made aware and orders received to give pt's AM dose of Lantus at this time. SSI coverage also given. Meal coverage held as pt states he will not eat any lunch today.

## 2013-07-03 NOTE — Progress Notes (Addendum)
[facility] for Infectious Disease  Date of Admission:  06/26/2013  Antibiotics: Antibiotics Given (last 72 hours)   Date/Time Action Medication Dose Rate   06/30/13 1247 Given   piperacillin-tazobactam (ZOSYN) IVPB 3.375 g 3.375 g 12.5 mL/hr   06/30/13 1412 Given   linezolid (ZYVOX) IVPB 600 mg 600 mg 300 mL/hr   06/30/13 2012 Given   piperacillin-tazobactam (ZOSYN) IVPB 3.375 g 3.375 g 12.5 mL/hr   07/01/13 0142 Given   linezolid (ZYVOX) IVPB 600 mg 600 mg 300 mL/hr   07/01/13 0400 Given   piperacillin-tazobactam (ZOSYN) IVPB 3.375 g 3.375 g 12.5 mL/hr   07/01/13 1335 Given   linezolid (ZYVOX) IVPB 600 mg 600 mg 300 mL/hr   07/02/13 0123 Given   linezolid (ZYVOX) IVPB 600 mg 600 mg 300 mL/hr   07/02/13 1230 Given   ampicillin-sulbactam (UNASYN) 1.5 g in sodium chloride 0.9 % 50 mL IVPB 1.5 g 100 mL/hr   07/02/13 1900 Given   ampicillin-sulbactam (UNASYN) 1.5 g in sodium chloride 0.9 % 50 mL IVPB 1.5 g 100 mL/hr   07/03/13 0047 Given   ampicillin-sulbactam (UNASYN) 1.5 g in sodium chloride 0.9 % 50 mL IVPB 1.5 g 100 mL/hr   07/03/13 0655 Given   ampicillin-sulbactam (UNASYN) 1.5 g in sodium chloride 0.9 % 50 mL IVPB 1.5 g 100 mL/hr   07/03/13 1110 Given   ampicillin-sulbactam (UNASYN) 1.5 g in sodium chloride 0.9 % 50 mL IVPB 1.5 g 100 mL/hr      Subjective: No new complaints  Objective: Temp:  [97.7 F (36.5 C)-101 F (38.3 C)] 97.7 F (36.5 C) (01/26 0540) Pulse Rate:  [57-77] 63 (01/26 1002) Resp:  [10-16] 10 (01/26 1112) BP: (98-118)/(62-73) 118/73 mmHg (01/26 1002) SpO2:  [93 %-100 %] 94 % (01/26 1112)  General: awake, alert, nad Skin: no rashes Lungs: CTA GU: wrapped  Lab Results Lab Results  Component Value Date   WBC 13.5* 07/03/2013   HGB 11.2* 07/03/2013   HCT 33.9* 07/03/2013   MCV 90.4 07/03/2013   PLT 379 07/03/2013    Lab Results  Component Value Date   CREATININE 0.89 07/03/2013   BUN 7 07/03/2013   NA 139 07/03/2013   K 4.2 07/03/2013    CL 100 07/03/2013   CO2 30 07/03/2013    Lab Results  Component Value Date   ALT 97* 06/24/2013   AST 72* 06/24/2013   ALKPHOS 81 06/24/2013   BILITOT 0.5 06/24/2013      Microbiology: Recent Results (from the past 240 hour(s))  GC/CHLAMYDIA PROBE AMP     Status: None   Collection Time    06/28/13  7:17 AM      Result Value Range Status   CT Probe RNA NEGATIVE  NEGATIVE Final   GC Probe RNA NEGATIVE  NEGATIVE Final   Comment: (NOTE)                                                                                               **Normal Reference Range: Negative**          Assay performed using the Gen-Probe  APTIMA COMBO2 (R) Assay.     Acceptable specimen types for this assay include APTIMA Swabs (Unisex,     endocervical, urethral, or vaginal), first void urine, and ThinPrep     liquid based cytology samples.     Performed at [facility]     Status: None   Collection Time    06/28/13  7:06 PM      Result Value Range Status   MRSA, PCR NEGATIVE  NEGATIVE Final   Staphylococcus aureus NEGATIVE  NEGATIVE Final   Comment:            The Xpert SA Assay (FDA     approved for NASAL specimens     in patients over 49 years of age),     is one component of     a comprehensive surveillance     program.  Test performance has     been validated by [employer] for patients greater     than or equal to 73 year old.     It is not intended     to diagnose infection nor to     guide or monitor treatment.  CULTURE, ROUTINE-ABSCESS     Status: None   Collection Time    06/28/13  8:11 PM      Result Value Range Status   Specimen Description ABSCESS SCROTUM   Final   Special Requests PATIENT ON FOLLOWING LEVIQUIN DOXYCYCLINE   Final   Gram Stain     Final   Value: ABUNDANT WBC PRESENT, PREDOMINANTLY PMN     NO SQUAMOUS EPITHELIAL CELLS SEEN     MODERATE GRAM POSITIVE COCCI IN PAIRS     Performed by [facility] Gram Stain Report Called to,Read  Back By and Verified With: Gram Stain Report Called to,Read Back By and Verified With:  W ALLRED RN AT 2053 ON 06/28/13 A NAVARRO     Performed at [facility]   Culture     Final   Value: MODERATE GROUP B STREP(S.AGALACTIAE)ISOLATED     Note: TESTING AGAINST S. AGALACTIAE NOT ROUTINELY PERFORMED DUE TO PREDICTABILITY OF AMP/PEN/VAN SUSCEPTIBILITY.     Performed at [facility]   Report Status 07/01/2013 FINAL   Final  ANAEROBIC CULTURE     Status: None   Collection Time    06/28/13  8:11 PM      Result Value Range Status   Specimen Description ABSCESS SCROTUM   Final   Special Requests PATIENT ON FOLLOWING LEVIQUIN DOXYCYCLINE   Final   Gram Stain     Final   Value: NO WBC SEEN     NO SQUAMOUS EPITHELIAL CELLS SEEN     MODERATE GRAM POSITIVE COCCI IN PAIRS     Performed at [facility]   Culture     Final   Value: NO ANAEROBES ISOLATED; CULTURE IN PROGRESS FOR 5 DAYS     Performed at [facility]   Report Status PENDING   Incomplete  GRAM STAIN     Status: None   Collection Time    06/28/13  8:11 PM      Result Value Range Status   Specimen Description ABSCESS SCROTUM   Final   Special Requests PATIENT ON FOLLOWING LEVIQUIN DOXYCYCLINE   Final   Gram Stain     Final   Value: NO WBC SEEN     MODERATE GRAM POSITIVE COCCI IN PAIRS  Gram Stain Report Called to,Read Back By and Verified With: Carrolyn Meiers RN 2053 06/28/13 A NAVARRO   Report Status 06/28/2013 FINAL   Final    Studies/Results: No results found.  Assessment/Plan: 1)  Cellulitis with abscess -  Examined yesterday in OR  Gram stain with GPC, culture with group B strep and changed to unasyn.   -continue with unasyn while here, will be able to switch to po Augmentin at discharge. -possible OR debridement again   2) vancomycin allergy - he has tolerated vancomycin well with no issues,  I will remove it from the allergy list.     Scharlene Gloss, [facility]  [facility] [place] [email or web address] [phone] pager   [phone] cell 07/03/2013, 11:13 AM

## 2013-07-03 NOTE — Consult Note (Signed)
WOC wound consult note Reason for Consult:Right groin wound and scrotal wound.  Seen today with Will Creig Hines, CCS PA. Patient is using PCA pump for pain and has been pre-medicated prior to dressing change. Photos taken to support documentation and for communication purposes by [employer] Wound type: Fornier's gangrene, s/p debridement.   Etiology:  Infectious/Surgical Pressure Ulcer POA: No Measurement:Right groin:  12cm x 4cm x 2.5cm.     Scrotal measurement not obtained today. Wound bed: Groin wound is 90% pink, moist and 10% yellow slough.  Scrotal wound is 70% yellow slough and 30% pink, moist tissue. Drainage (amount, consistency, odor) Light yellow exudate, no odor Periwound:intact with some erythema and induration to 1cm at periphery Dressing procedure/placement/frequency: POC is to continue with twice daily and PRN saline dressing changes to this area filling the defects and maintaining a moist topper dressing.  Patient's dressings are held in place with mesh briefs. [place] nursing team will not follow routinely, but will remain available to this patient, the nursing and surgical teams.  Please re-consult if needed in-between visits. Thanks, Maudie Flakes, MSN, RN, [place], [place], [place] [phone])

## 2013-07-03 NOTE — Progress Notes (Signed)
3 Days Post-Op  Subjective: Still very painful just to pull dressing down.   Right groin incision.    Scrotum 07/03/13, still some yellow fibrinous material, wetter in appearance on the left than right. Objective: Vital signs in last 24 hours: Temp:  [97.7 F (36.5 C)-101 F (38.3 C)] 97.7 F (36.5 C) (01/26 0540) Pulse Rate:  [57-77] 57 (01/26 0540) Resp:  [14-16] 16 (01/26 0750) BP: (98-116)/(62-72) 98/62 mmHg (01/26 0540) SpO2:  [93 %-100 %] 100 % (01/26 0750) Last BM Date: 06/26/13 Carb Modified diet, Afebrile, VSS WBC is still up, but improved from 22K on 06/26/13. Intake/Output from previous day: 01/25 0701 - 01/26 0700 In: 2020 [P.O.:720; I.V.:1200; IV Piggyback:100] Out: 3200 [Urine:3200] Intake/Output this shift: Total I/O In: -  Out: 200 [Urine:200]  General appearance: alert, cooperative, no distress and still has a good deal of pain as pictures above would suggest, with any manipulation/dressing change.  He does not have any sites under the scrotum that are not drainaed. Skin: see pictures above.   Lab Results:   Recent Labs  07/02/13 0350 07/03/13 0507  WBC 14.0* 13.5*  HGB 11.8* 11.2*  HCT 34.8* 33.9*  PLT 365 379    BMET  Recent Labs  07/03/13 0507  NA 139  K 4.2  CL 100  CO2 30  GLUCOSE 103*  BUN 7  CREATININE 0.89  CALCIUM 8.4   PT/INR No results found for this basename: LABPROT, INR,  in the last 72 hours  No results found for this basename: AST, ALT, ALKPHOS, BILITOT, PROT, ALBUMIN,  in the last 168 hours   Lipase  No results found for this basename: lipase     Studies/Results: No results found.  Medications: . ampicillin-sulbactam (UNASYN) IV  1.5 g Intravenous Q6H  . citalopram  20 mg Oral Daily  . diphenhydrAMINE  25-50 mg Intravenous Q8H  . enoxaparin (LOVENOX) injection  40 mg Subcutaneous Q24H  . fenofibrate  160 mg Oral Daily  . HYDROmorphone PCA 0.3 mg/mL   Intravenous Q4H  . insulin aspart  0-15 Units  Subcutaneous TID WC  . insulin aspart  8 Units Subcutaneous TID WC  . insulin glargine  30 Units Subcutaneous BID  . lisinopril  10 mg Oral Daily  . nicotine  21 mg Transdermal Daily  . QUEtiapine Fumarate  150 mg Oral QHS    Assessment/Plan 1.  Fournier's gangrene of the scrotum and inguinal abscess 1a.  Incision and drainage of Fournier's gangrene of the scrotum and insertion of Foley catheter (assistant Dr. Marlou Starks in incision and drainage of inguinal abscess)06/28/2013,  Reece Packer, MD 1b.  INCISION AND DRAINAGE SCROTAL ABSCESS, 06/28/2013  Dr. Marlou Starks 1c  Right scrotal debridement Scrotal reconstruction, 06/30/2013 [name], MD  2. Hx of right thigh dissection and melanoma resection 2005.  3. Basal Cell Ca mid back resection 2007.  4. AODM with poor control  5. Tobacco use 40 years  6. Chronic back and lower leg pain  7. Dyslipidemia  8. Anxiety and depression  9. DJD   Plan:  We have taken down the dressing with Wound care, after medication, wet to dry dressing applied to both the right groin, which looks really good.  The scrotum/testicles still have some degree of yellow exudative material on it as noted above.   NPO for possible further scrotal debridement by Urology tomorrow. I think the right groin is doing well and we will follow a few times per week.  LOS: 7 days    Wessie Shanks 07/03/2013

## 2013-07-03 NOTE — Progress Notes (Signed)
PT Cancellation Note  Patient Details Name: WILMOT QUEVEDO MRN: [id number] DOB: 1955-08-28   Cancelled Treatment:    Reason Eval/Treat Not Completed: Other (comment) Order discontinued.  Please re-order as pt becomes appropriate.  Thanks!   Kipp Shank,KATHrine E 07/03/2013, 3:54 PM Carmelia Bake, PT, DPT 07/03/2013 Pager: [phone]

## 2013-07-03 NOTE — Progress Notes (Signed)
3 Days Post-Op Subjective: Patient reports Scrotal pain - difficulty with PCA overnight b/c of auto shutdown related to CO2 sensor when [place] comes off his face. Tolerated dressing change at 2AM this AM. No fevers.   Objective: Vital signs in last 24 hours: Temp:  [97.7 F (36.5 C)-101 F (38.3 C)] 97.7 F (36.5 C) (01/26 0540) Pulse Rate:  [57-77] 57 (01/26 0540) Resp:  [13-16] 16 (01/26 0750) BP: (98-131)/(62-72) 98/62 mmHg (01/26 0540) SpO2:  [91 %-100 %] 100 % (01/26 0750)  Intake/Output from previous day: 01/25 0701 - 01/26 0700 In: 2020 [P.O.:720; I.V.:1200; IV Piggyback:100] Out: 3200 [Urine:3200] Intake/Output this shift: Total I/O In: -  Out: 200 [Urine:200]  Physical Exam:  General: Alert and oriented today Superior edge of skin necrotic, wound indurated, extremely tender along scrotum towards right inguinal crease.   Lab Results:  Recent Labs  07/02/13 0350 07/03/13 0507  WBC 14.0* 13.5*  HGB 11.8* 11.2*  HCT 34.8* 33.9*    Recent Labs  07/03/13 0507  NA 139  K 4.2  CL 100  CO2 30  GLUCOSE 103*  BUN 7  CREATININE 0.89  CALCIUM 8.4   No results found for this basename: LABPT, INR,  in the last 72 hours No results found for this basename: LABURIN,  in the last 72 hours Results for orders placed during the hospital encounter of 06/26/13  GC/CHLAMYDIA PROBE AMP     Status: None   Collection Time    06/28/13  7:17 AM      Result Value Range Status   CT Probe RNA NEGATIVE  NEGATIVE Final   GC Probe RNA NEGATIVE  NEGATIVE Final   Comment: (NOTE)                                                                                               **Normal Reference Range: Negative**          Assay performed using the Gen-Probe APTIMA COMBO2 (R) Assay.     Acceptable specimen types for this assay include APTIMA Swabs (Unisex,     endocervical, urethral, or vaginal), first void urine, and ThinPrep     liquid based cytology samples.     Performed at [place]     Status: None   Collection Time    06/28/13  7:06 PM      Result Value Range Status   MRSA, PCR NEGATIVE  NEGATIVE Final   Staphylococcus aureus NEGATIVE  NEGATIVE Final   Comment:            The Xpert SA Assay (FDA     approved for NASAL specimens     in patients over 59 years of age),     is one component of     a comprehensive surveillance     program.  Test performance has     been validated by [employer] for patients greater     than or equal to 26 year old.     It is not intended     to diagnose infection  nor to     guide or monitor treatment.  CULTURE, ROUTINE-ABSCESS     Status: None   Collection Time    06/28/13  8:11 PM      Result Value Range Status   Specimen Description ABSCESS SCROTUM   Final   Special Requests PATIENT ON FOLLOWING LEVIQUIN DOXYCYCLINE   Final   Gram Stain     Final   Value: ABUNDANT WBC PRESENT, PREDOMINANTLY PMN     NO SQUAMOUS EPITHELIAL CELLS SEEN     MODERATE GRAM POSITIVE COCCI IN PAIRS     Performed by [facility] Gram Stain Report Called to,Read Back By and Verified With: Gram Stain Report Called to,Read Back By and Verified With:  W ALLRED RN AT 2053 ON 06/28/13 A NAVARRO     Performed at [facility]   Culture     Final   Value: MODERATE GROUP B STREP(S.AGALACTIAE)ISOLATED     Note: TESTING AGAINST S. AGALACTIAE NOT ROUTINELY PERFORMED DUE TO PREDICTABILITY OF AMP/PEN/VAN SUSCEPTIBILITY.     Performed at [facility]   Report Status 07/01/2013 FINAL   Final  ANAEROBIC CULTURE     Status: None   Collection Time    06/28/13  8:11 PM      Result Value Range Status   Specimen Description ABSCESS SCROTUM   Final   Special Requests PATIENT ON FOLLOWING LEVIQUIN DOXYCYCLINE   Final   Gram Stain     Final   Value: NO WBC SEEN     NO SQUAMOUS EPITHELIAL CELLS SEEN     MODERATE GRAM POSITIVE COCCI IN PAIRS     Performed at [facility]   Culture     Final   Value:  NO ANAEROBES ISOLATED; CULTURE IN PROGRESS FOR 5 DAYS     Performed at [facility]   Report Status PENDING   Incomplete  GRAM STAIN     Status: None   Collection Time    06/28/13  8:11 PM      Result Value Range Status   Specimen Description ABSCESS SCROTUM   Final   Special Requests PATIENT ON FOLLOWING LEVIQUIN DOXYCYCLINE   Final   Gram Stain     Final   Value: NO WBC SEEN     MODERATE GRAM POSITIVE COCCI IN PAIRS     Gram Stain Report Called to,Read Back By and Verified With: Carrolyn Meiers RN 2053 06/28/13 A NAVARRO   Report Status 06/28/2013 FINAL   Final    Studies/Results: No results found.  Assessment/Plan:  Fournier's gangrene  Diabetes  Continue wet to dry dressing - wound care consult, may need additional OR debridement.  Linezolid as per ID  OK to transfer to floor   LOS: 7 days   Ardis Hug[facility] 07/03/2013, 7:58 AM

## 2013-07-03 NOTE — Progress Notes (Signed)
Seen, agree with above. Pt does continue to have pain with dressing changes.  Urology may debride upper scrotal skin depending on appearance tomorrow.  Will recheck Wednesday unless issues arise.

## 2013-07-03 NOTE — Care Management Note (Addendum)
    Page 1 of 2   07/07/2013     6:08:27 PM   CARE MANAGEMENT NOTE 07/07/2013  Patient:  Charles Manning, Charles Manning   Account Number:  [id number]  Date Initiated:  06/27/2013  Documentation initiated by:  [facility]  Subjective/Objective Assessment:   58 year old male admitted with Cellulitis of R groin/scrotum vs epididymitis.     Action/Plan:   From home.   Anticipated DC Date:  07/07/2013   Anticipated DC Plan:  [place]  CM consult      [facility] Choice  HOME HEALTH   Choice offered to / List presented to:  C-1 Patient        [place] arranged  HH-1 RN  [place].   Status of service:  Completed, signed off Medicare Important Message given?  NA - LOS <3 / Initial given by admissions (If response is "NO", the following Medicare IM given date fields will be blank) Date Medicare IM given:   Date Additional Medicare IM given:    Discharge Disposition:  [place]  Per UR Regulation:  Reviewed for med. necessity/level of care/duration of stay  If discussed at [place] of Stay Meetings, dates discussed:   07/04/2013  07/06/2013    Comments:  07/07/2013 Sherrin Daisy BSN RN CCM [phone] CM spoke with patient. States he has friend that will offer assistance when he gets home. States he has been taught how to do his wound care by his nurse. [facility] will start services tomorrow.  07-07-13 Sunday Spillers RN CM 1000 Spoke with patient at bedside, he is unable to pay copay for SNF, wants to go home and feels he can manage his dressing changes. Has chosen [facility] for [facility] services. Contacted [facility] to arrange, they are aware that he will be only one to teach, RN to instruct patient prior and have him demonstrate competence prior to d/c.  07/06/2013 [place] [phone]  CM spoke with patient. States he lives in [place], alone. Has crutches and cane.  Unable to state who might be able to assist him when he returns to home. States he has made calls to friends but no definites on who might be able to assist. Pt asked if he would consider SNF. He states yes. PT eval today. CM will f/u  07/03/2013 [place] CCM [phone] CONC  REVIEW COMPLETED. POST OP DAY #1I&D. CM will follow for progress.   06/27/13 RUTH [place] RN BSN Pt seen by ED CM in regards to financial difficulties.

## 2013-07-04 LAB — CBC
HCT: 33.9 % — ABNORMAL LOW (ref 39.0–52.0)
Hemoglobin: 11.7 g/dL — ABNORMAL LOW (ref 13.0–17.0)
MCH: 31.1 pg (ref 26.0–34.0)
MCHC: 34.5 g/dL (ref 30.0–36.0)
MCV: 90.2 fL (ref 78.0–100.0)
Platelets: 404 10*3/uL — ABNORMAL HIGH (ref 150–400)
RBC: 3.76 MIL/uL — ABNORMAL LOW (ref 4.22–5.81)
RDW: 12.2 % (ref 11.5–15.5)
WBC: 15.4 10*3/uL — AB (ref 4.0–10.5)

## 2013-07-04 LAB — ANAEROBIC CULTURE: Gram Stain: NONE SEEN

## 2013-07-04 LAB — GLUCOSE, CAPILLARY
GLUCOSE-CAPILLARY: 85 mg/dL (ref 70–99)
Glucose-Capillary: 96 mg/dL (ref 70–99)
Glucose-Capillary: 98 mg/dL (ref 70–99)
Glucose-Capillary: 85 mg/dL (ref 70–99)
Glucose-Capillary: 91 mg/dL (ref 70–99)

## 2013-07-04 NOTE — Progress Notes (Signed)
4 Days Post-Op  Subjective: Removed dressing at bedside with urology.  They don't see a need for continued debridement at this time.  Pt tolerating diet, not getting up much due to pain, but feels as though he can get up today.    Objective: Vital signs in last 24 hours: Temp:  [98.5 F (36.9 C)-100.1 F (37.8 C)] 98.5 F (36.9 C) (01/27 0506) Pulse Rate:  [63-77] 65 (01/27 0506) Resp:  [10-16] 10 (01/27 0506) BP: (114-127)/(66-76) 119/75 mmHg (01/27 0506) SpO2:  [94 %-100 %] 95 % (01/27 0506) Last BM Date: 06/26/13  Intake/Output from previous day: 01/26 0701 - 01/27 0700 In: 2913 [P.O.:840; I.V.:1973; IV Piggyback:100] Out: 3200 [Urine:3200] Intake/Output this shift:    PE: Gen:  Alert, NAD, easily agitated with any manipulation of dressing/change, but improved today Skin:  Inguinal wound is clean with some minor slough; scrotum with yellow slough which is improving, area of induration and patchy erythema at the superior aspect of the inguinal wound which extends to the base of the superior scrotum/base of penis   Lab Results:   Recent Labs  07/02/13 0350 07/03/13 0507  WBC 14.0* 13.5*  HGB 11.8* 11.2*  HCT 34.8* 33.9*  PLT 365 379   BMET  Recent Labs  07/03/13 0507  NA 139  K 4.2  CL 100  CO2 30  GLUCOSE 103*  BUN 7  CREATININE 0.89  CALCIUM 8.4   PT/INR No results found for this basename: LABPROT, INR,  in the last 72 hours CMP     Component Value Date/Time   NA 139 07/03/2013 0507   K 4.2 07/03/2013 0507   CL 100 07/03/2013 0507   CO2 30 07/03/2013 0507   GLUCOSE 103* 07/03/2013 0507   BUN 7 07/03/2013 0507   CREATININE 0.89 07/03/2013 0507   CALCIUM 8.4 07/03/2013 0507   PROT 7.0 06/24/2013 2223   ALBUMIN 3.5 06/24/2013 2223   AST 72* 06/24/2013 2223   ALT 97* 06/24/2013 2223   ALKPHOS 81 06/24/2013 2223   BILITOT 0.5 06/24/2013 2223   GFRNONAA >90 07/03/2013 0507   GFRAA >90 07/03/2013 0507   Lipase  No results found for this basename: lipase        Studies/Results: No results found.  Anti-infectives: Anti-infectives   Start     Dose/Rate Route Frequency Ordered Stop   07/02/13 1200  ampicillin-sulbactam (UNASYN) 1.5 g in sodium chloride 0.9 % 50 mL IVPB     1.5 g 100 mL/hr over 30 Minutes Intravenous Every 6 hours 07/02/13 1038     06/30/13 1400  linezolid (ZYVOX) IVPB 600 mg  Status:  Discontinued     600 mg 300 mL/hr over 60 Minutes Intravenous Every 12 hours 06/30/13 1306 07/02/13 1038   06/29/13 1400  vancomycin (VANCOCIN) IVPB 1000 mg/200 mL premix  Status:  Discontinued     1,000 mg 80 mL/hr over 2.5 Hours Intravenous Every 8 hours 06/29/13 1315 06/30/13 1306   06/29/13 1000  linezolid (ZYVOX) IVPB 600 mg  Status:  Discontinued     600 mg 300 mL/hr over 60 Minutes Intravenous Every 12 hours 06/28/13 2030 06/29/13 1235   06/29/13 0400  piperacillin-tazobactam (ZOSYN) IVPB 3.375 g  Status:  Discontinued     3.375 g 12.5 mL/hr over 240 Minutes Intravenous Every 8 hours 06/28/13 2030 07/01/13 1104   06/28/13 2030  linezolid (ZYVOX) IVPB 600 mg     600 mg 300 mL/hr over 60 Minutes Intravenous  Once 06/28/13 2008 06/28/13 2204  06/28/13 2030  piperacillin-tazobactam (ZOSYN) IVPB 3.375 g     3.375 g 100 mL/hr over 30 Minutes Intravenous  Once 06/28/13 2009 06/28/13 2030   06/28/13 1000  levofloxacin (LEVAQUIN) tablet 500 mg  Status:  Discontinued     500 mg Oral Daily 06/27/13 1405 06/28/13 2006   06/27/13 1200  levofloxacin (LEVAQUIN) IVPB 750 mg  Status:  Discontinued     750 mg 100 mL/hr over 90 Minutes Intravenous Every 24 hours 06/27/13 1055 06/27/13 1405   06/27/13 0015  doxycycline (VIBRA-TABS) tablet 100 mg  Status:  Discontinued     100 mg Oral Every 12 hours 06/26/13 2354 06/28/13 2009   06/26/13 2200  clindamycin (CLEOCIN) IVPB 300 mg  Status:  Discontinued     300 mg 100 mL/hr over 30 Minutes Intravenous 3 times per day 06/26/13 2100 06/27/13 1400   06/26/13 2115  cefTRIAXone (ROCEPHIN) 1 g in  dextrose 5 % 50 mL IVPB     1 g 100 mL/hr over 30 Minutes Intravenous  Once 06/26/13 2100 06/26/13 2150   06/26/13 1500  clindamycin (CLEOCIN) IVPB 600 mg     600 mg 100 mL/hr over 30 Minutes Intravenous  Once 06/26/13 1437 06/26/13 1543       Assessment/Plan 1. Fournier's gangrene of the scrotum and inguinal abscess  1a. Incision and drainage of Fournier's gangrene of the scrotum and insertion of Foley catheter (assistant Dr. Marlou Starks in incision and drainage of inguinal abscess)06/28/2013, Reece Packer, MD  1b. INCISION AND DRAINAGE SCROTAL ABSCESS, 06/28/2013 Dr. Marlou Starks  1c Right scrotal debridement Scrotal reconstruction, 06/30/2013 [name], MD   2. Hx of right thigh dissection and melanoma resection 2005.  3. Basal Cell Ca mid back resection 2007.  4. AODM with poor control  5. Tobacco use 40 years  6. Chronic back and lower leg pain  7. Dyslipidemia  8. Anxiety and depression  9. DJD   Plan:  1.  Dressing changes WD BID with medication 2.  Urology not planning on re-debridement, awaiting the wound to look a bit cleaner before asking wound care to potentially place a wound vac.  Question whether santyl would be appropriate in this setting vs hydrotherapy. 3.  Need to get this patient up OOB and ambulating (Looks like PT wants to be re consulted when the patient agrees to get up OOB) 4.  SCD's and  lovenox     LOS: 8 days    Coralie Keens 07/04/2013, 7:31 AM Pager: [phone]

## 2013-07-04 NOTE — Progress Notes (Signed)
Seen, agree with above. Wound care.

## 2013-07-04 NOTE — Progress Notes (Addendum)
[facility] PROGRESS NOTE  Charles Manning:[id number] DOB: 1955/08/28 DOA: 06/26/2013 PCP: Benito Mccreedy, MD  Off Service Summary: Presented initially with cellulitis of the scrotum. Edema worsened and some drainage was noted. The patient underwent surgery I/D on 1/21 per Urology and General Surgery. Cultures have grown GBS and ID is following. See antibiotic history below. Since surgery, the patient had undergone further debridement on 1/23 by Urology. Currently the patient is continuing with wound care and pain management.   Assessment/Plan: Right epididymo-orchitis/Fournier's gangrene - Initially started on empiric coverage with IV clindamycin and doxycycline. - Wound cx with GBS - Per ID recs, on linezolid - Appreciate urology and general surgery involvement - Pt is s/p i/d of abscess and scrotal debridement - Wound care per Surgery - continue IV fluids and prn antiemetics.  uncontrolled DM - Initially persistent hyperglycemia overnight with fsg in 500s with AG of 15.  - Was placed on glucose stabilizer. Now improved and AG closed.  - diabetic coordinator following - Glucose now much improved - Cont current regimen  Depression  -continue celexa and seroquel    Hyponatremia  -due to hyperglycemia and dehydration -now resolved  DVT proph: lovenox  Diet: diabetic  Code Status: full code Family Communication: Pt in room Disposition Plan: Pending  Consultants:  Urology  General surgery  ID   Procedure:   none   Antibiotics:  IV clinda ( 1/19>>06/27/13)  IV doxycycline 1/191/5>>>06/30/13  IV levaquin ( 1/20>>06/30/13)  Linezolid 06/30/13>>>07/03/13  Zosyn 06/29/13>>>07/01/13  Unasyn 07/03/13>>>  HPI/Subjective: No acute events noted. Pt without complaints. Continued scrotal tenderness, improved with pain meds.  Objective: Filed Vitals:   07/04/13 0746  BP:   Pulse:   Temp:   Resp: 9    Intake/Output Summary (Last 24 hours) at 07/04/13  1207 Last data filed at 07/04/13 1100  Gross per 24 hour  Intake 1148.33 ml  Output   4100 ml  Net -2951.67 ml   Filed Weights   06/26/13 2300 06/28/13 2300 07/02/13 0400  Weight: 88.3 kg (194 lb 10.7 oz) 96.2 kg (212 lb 1.3 oz) 99.8 kg (220 lb 0.3 oz)    Exam:   General:  Middle aged male lying in bed appears sleepy and drowsy  HEENT: no Pallor, dry oral mucosa   chest: clear b/l, no added sounds  CVS: NS1&S2,, no MRG  Abd: soft, NT, ND, BS+, swollen scrotum with erythema, no discharge. Warmth and  Tender to palaption  Ext: warm, no edema  Pelvic/GU: scrotum over B testes removed with packing in place, tracking through 2 inguinal region   CNS: sleepy , awake to commands and oriented  Data Reviewed: Basic Metabolic Panel:  Recent Labs Lab 06/28/13 0555 06/29/13 0335 07/03/13 0507  NA 130* 130* 139  K 4.4 4.8 4.2  CL 95* 97 100  CO2 20 22 30   GLUCOSE 310* 300* 103*  BUN 20 22 7   CREATININE 0.77 1.01 0.89  CALCIUM 8.2* 7.9* 8.4   Liver Function Tests: No results found for this basename: AST, ALT, ALKPHOS, BILITOT, PROT, ALBUMIN,  in the last 168 hours No results found for this basename: LIPASE, AMYLASE,  in the last 168 hours No results found for this basename: AMMONIA,  in the last 168 hours CBC:  Recent Labs Lab 06/28/13 0555 06/29/13 0335 07/02/13 0350 07/03/13 0507 07/04/13 0840  WBC 21.0* 21.3* 14.0* 13.5* 15.4*  NEUTROABS 16.8*  --   --   --   --   HGB 12.6* 12.9* 11.8*  11.2* 11.7*  HCT 36.2* 37.4* 34.8* 33.9* 33.9*  MCV 88.1 88.4 90.2 90.4 90.2  PLT 271 318 365 379 404*   Cardiac Enzymes: No results found for this basename: CKTOTAL, CKMB, CKMBINDEX, TROPONINI,  in the last 168 hours BNP (last 3 results) No results found for this basename: PROBNP,  in the last 8760 hours CBG:  Recent Labs Lab 07/03/13 1103 07/03/13 1703 07/03/13 2150 07/04/13 0721 07/04/13 0853  GLUCAP 146* 248* 108* 96 98    Recent Results (from the past 240  hour(s))  GC/CHLAMYDIA PROBE AMP     Status: None   Collection Time    06/28/13  7:17 AM      Result Value Range Status   CT Probe RNA NEGATIVE  NEGATIVE Final   GC Probe RNA NEGATIVE  NEGATIVE Final   Comment: (NOTE)                                                                                               **Normal Reference Range: Negative**          Assay performed using the Gen-Probe APTIMA COMBO2 (R) Assay.     Acceptable specimen types for this assay include APTIMA Swabs (Unisex,     endocervical, urethral, or vaginal), first void urine, and ThinPrep     liquid based cytology samples.     Performed at [facility]     Status: None   Collection Time    06/28/13  7:06 PM      Result Value Range Status   MRSA, PCR NEGATIVE  NEGATIVE Final   Staphylococcus aureus NEGATIVE  NEGATIVE Final   Comment:            The Xpert SA Assay (FDA     approved for NASAL specimens     in patients over 44 years of age),     is one component of     a comprehensive surveillance     program.  Test performance has     been validated by [employer] for patients greater     than or equal to 67 year old.     It is not intended     to diagnose infection nor to     guide or monitor treatment.  CULTURE, ROUTINE-ABSCESS     Status: None   Collection Time    06/28/13  8:11 PM      Result Value Range Status   Specimen Description ABSCESS SCROTUM   Final   Special Requests PATIENT ON FOLLOWING LEVIQUIN DOXYCYCLINE   Final   Gram Stain     Final   Value: ABUNDANT WBC PRESENT, PREDOMINANTLY PMN     NO SQUAMOUS EPITHELIAL CELLS SEEN     MODERATE GRAM POSITIVE COCCI IN PAIRS     Performed by [facility] Gram Stain Report Called to,Read Back By and Verified With: Gram Stain Report Called to,Read Back By and Verified With:  W ALLRED RN AT 2053 ON 06/28/13 A NAVARRO     Performed at [facility]  Culture     Final   Value: MODERATE GROUP B  STREP(S.AGALACTIAE)ISOLATED     Note: TESTING AGAINST S. AGALACTIAE NOT ROUTINELY PERFORMED DUE TO PREDICTABILITY OF AMP/PEN/VAN SUSCEPTIBILITY.     Performed at [facility]   Report Status 07/01/2013 FINAL   Final  ANAEROBIC CULTURE     Status: None   Collection Time    06/28/13  8:11 PM      Result Value Range Status   Specimen Description ABSCESS SCROTUM   Final   Special Requests PATIENT ON FOLLOWING LEVIQUIN DOXYCYCLINE   Final   Gram Stain     Final   Value: NO WBC SEEN     NO SQUAMOUS EPITHELIAL CELLS SEEN     MODERATE GRAM POSITIVE COCCI IN PAIRS     Performed at [facility]   Culture     Final   Value: NO ANAEROBES ISOLATED     Performed at [facility]   Report Status 07/04/2013 FINAL   Final  GRAM STAIN     Status: None   Collection Time    06/28/13  8:11 PM      Result Value Range Status   Specimen Description ABSCESS SCROTUM   Final   Special Requests PATIENT ON FOLLOWING LEVIQUIN DOXYCYCLINE   Final   Gram Stain     Final   Value: NO WBC SEEN     MODERATE GRAM POSITIVE COCCI IN PAIRS     Gram Stain Report Called to,Read Back By and Verified With: Carrolyn Meiers RN 2053 06/28/13 A NAVARRO   Report Status 06/28/2013 FINAL   Final     Studies: No results found.  Scheduled Meds: . ampicillin-sulbactam (UNASYN) IV  1.5 g Intravenous Q6H  . citalopram  20 mg Oral Daily  . diphenhydrAMINE  25-50 mg Intravenous Q8H  . enoxaparin (LOVENOX) injection  40 mg Subcutaneous Q24H  . fenofibrate  160 mg Oral Daily  . HYDROmorphone PCA 0.3 mg/mL   Intravenous Q4H  . insulin aspart  0-15 Units Subcutaneous TID WC  . insulin aspart  8 Units Subcutaneous TID WC  . insulin glargine  30 Units Subcutaneous BID  . lisinopril  10 mg Oral Daily  . nicotine  21 mg Transdermal Daily  . QUEtiapine Fumarate  150 mg Oral QHS   Continuous Infusions: . dextrose 5 % and 0.45% NaCl 100 mL/hr at 07/03/13 2308    Time spent: 35 minutes  Dion Sibal, [facility] Pager [phone]. If 7PM-7AM, please contact night-coverage at [email or web address], password [facility] 07/04/2013, 12:07 PM  LOS: 8 days

## 2013-07-04 NOTE — Progress Notes (Signed)
Patient ID: Charles Manning, male   DOB: January 17, 1956, 58 y.o.   MRN: [id number]  Mr. Delice Lesch without complaints. He wants to ambulate, so we can send back PT/OT.   PE: NAD Scrotal wound looks viable healthy, scrotal skin viable, exposed surface fibrinous but contracting well.   A/P: Complex soft tissue infection - I discussed with patient we would typically place right testicle in thigh pouch to reduce size of wound and promote closure. He doesn't have that option. We discussed right orchiectomy to decrease size of wound but he absolutely wants to keep the testicle.  Rec - continue wet to dry - promote granulation

## 2013-07-05 DIAGNOSIS — E162 Hypoglycemia, unspecified: Secondary | ICD-10-CM | POA: Diagnosis not present

## 2013-07-05 LAB — CBC
HEMATOCRIT: 33.1 % — AB (ref 39.0–52.0)
Hemoglobin: 10.9 g/dL — ABNORMAL LOW (ref 13.0–17.0)
MCH: 29.9 pg (ref 26.0–34.0)
MCHC: 32.9 g/dL (ref 30.0–36.0)
MCV: 90.9 fL (ref 78.0–100.0)
Platelets: 380 10*3/uL (ref 150–400)
RBC: 3.64 MIL/uL — AB (ref 4.22–5.81)
RDW: 12.3 % (ref 11.5–15.5)
WBC: 15.2 10*3/uL — AB (ref 4.0–10.5)

## 2013-07-05 LAB — BASIC METABOLIC PANEL
BUN: 5 mg/dL — AB (ref 6–23)
CO2: 28 mEq/L (ref 19–32)
Calcium: 7.9 mg/dL — ABNORMAL LOW (ref 8.4–10.5)
Chloride: 99 mEq/L (ref 96–112)
Creatinine, Ser: 0.86 mg/dL (ref 0.50–1.35)
GFR calc Af Amer: >90 mL/min (ref >90)
GFR calc non Af Amer: >90 mL/min (ref >90)
Glucose, Bld: 69 mg/dL — ABNORMAL LOW (ref 70–99)
Potassium: 3.8 mEq/L (ref 3.7–5.3)
Sodium: 136 mEq/L — ABNORMAL LOW (ref 137–147)

## 2013-07-05 LAB — GLUCOSE, CAPILLARY
GLUCOSE-CAPILLARY: 85 mg/dL (ref 70–99)
GLUCOSE-CAPILLARY: 146 mg/dL — AB (ref 70–99)
Glucose-Capillary: 89 mg/dL (ref 70–99)
Glucose-Capillary: 124 mg/dL — ABNORMAL HIGH (ref 70–99)
Glucose-Capillary: 48 mg/dL — ABNORMAL LOW (ref 70–99)
Glucose-Capillary: 62 mg/dL — ABNORMAL LOW (ref 70–99)

## 2013-07-05 MED ORDER — DOCUSATE SODIUM 100 MG PO CAPS
100.0000 mg | ORAL_CAPSULE | Freq: Two times a day (BID) | ORAL | Status: DC
  Administered 2013-07-05 – 2013-07-07 (×5): 100 mg via ORAL

## 2013-07-05 MED ORDER — BISACODYL 10 MG RE SUPP
10.0000 mg | Freq: Once | RECTAL | Status: AC
  Administered 2013-07-05: 10 mg via RECTAL
  Filled 2013-07-05: qty 1

## 2013-07-05 MED ORDER — OXYCODONE-ACETAMINOPHEN 5-325 MG PO TABS
1.0000 | ORAL_TABLET | ORAL | Status: DC | PRN
  Administered 2013-07-05 – 2013-07-06 (×6): 2 via ORAL
  Filled 2013-07-05 (×6): qty 2

## 2013-07-05 MED ORDER — INSULIN ASPART 100 UNIT/ML ~~LOC~~ SOLN
3.0000 [IU] | Freq: Three times a day (TID) | SUBCUTANEOUS | Status: DC
  Administered 2013-07-05 – 2013-07-06 (×2): 3 [IU] via SUBCUTANEOUS

## 2013-07-05 NOTE — Progress Notes (Signed)
AM dressing change not done in order that MD can see wound and AM nurse redress at that time. Pt in agreement. Last wound care 1730 07/04/13. Unable to change dressing per BID schedule until this am.

## 2013-07-05 NOTE — Progress Notes (Signed)
Agree with above. Dressing changes.

## 2013-07-05 NOTE — Progress Notes (Signed)
[facility] PROGRESS NOTE  Charles Manning [id number] DOB: [birth year] DOA: 06/26/2013 PCP: Benito Mccreedy, MD   Brief narrative 58 y/o male with HTN, chr pain, uncontrolled DM presented with cellulitis and abscess of scrotum.underwent surgery I/D on 1/21 per Urology and General Surgery. Cultures have grown GBS and ID is following. Since surgery, the patient had undergone further debridement on 1/23 by Urology. Currently the patient is continuing with wound care and pain management.  Assessment/Plan:  Right epididymo-orchitis/Fournier's gangrene  - Initially started on empiric coverage with IV clindamycin and doxycycline.  - Wound cx growing GBS  - has been on multiple abx since admission. Now on IV unasyn. Will discussED with ID Dr Linus Salmons who recommended 2 week course of Augmentin upon discharge ( needs abx until wound fully healed) will be followed as oiutpt - Appreciate urology and general surgery recommendations - Wound care per Surgery  - continue IV fluids and prn antiemetics.   uncontrolled DM  - Initially persistent hyperglycemia overnight with fsg in 500s with AG of 15.  - Was placed on glucose stabilizer. Now improved and AG closed.  - diabetic coordinator following  -noted for hypoglycemia of 48 today.reported some dizziness. Improved with some icecream to 60s. fsg have been well controlled past 24 hrs. i will continue his current dose fo lantus 9 30 units bid) and reduce premeal aspart to 3 u tid. Monitor closely  Depression  -continue celexa and seroquel   Hyponatremia  -due to hyperglycemia and dehydration  -now resolved   Malnutrition  ordered nutrition consult  Patient has not been out of bed for several days. Ordered PT eval   DVT proph: lovenox  Diet: diabetic  Code Status: full Family Communication: none at bedside Disposition Plan:pending PT eval. Possibly d/c in 1-2 days   Consultants:  ID  UROLOGY    surgery  Procedures:  Debridement of scrotal wound  Antibiotics: IV clinda ( 1/19>>06/27/13)  IV doxycycline 1/191/5>>>06/30/13  IV levaquin ( 1/20>>06/30/13)  Linezolid 06/30/13>>>07/03/13  Zosyn 06/29/13>>>07/01/13  Unasyn 07/03/13>>>   HPI/Subjective: Patent drooped his sugar to 48 this afternoon. Improved with some ice cram given. reported some dizziness but now stable.   Objective: Filed Vitals:   07/05/13 0521  BP: 128/77  Pulse: 67  Temp: 98.6 F (37 C)  Resp: 10    Intake/Output Summary (Last 24 hours) at 07/05/13 1406 Last data filed at 07/05/13 1139  Gross per 24 hour  Intake 5124.1 ml  Output   4200 ml  Net  924.1 ml   Filed Weights   06/26/13 2300 06/28/13 2300 07/02/13 0400  Weight: 88.3 kg (194 lb 10.7 oz) 96.2 kg (212 lb 1.3 oz) 99.8 kg (220 lb 0.3 oz)    Exam:   General:  Middle aged male in NAD  HEENT: no pallor, moist mucosa  Chest: clear b/l, no added sounds  CVS: NS1&S2, no murmurs  Abd: soft, NT, ND, BS+ wound packing over scrotum, appears clean  Ext: warm, no edema  CNS: AAOX3  Data Reviewed: Basic Metabolic Panel:  Recent Labs Lab 06/29/13 0335 07/03/13 0507 07/05/13 0425  NA 130* 139 136*  K 4.8 4.2 3.8  CL 97 100 99  CO2 22 30 28   GLUCOSE 300* 103* 69*  BUN 22 7 5*  CREATININE 1.01 0.89 0.86  CALCIUM 7.9* 8.4 7.9*   Liver Function Tests: No results found for this basename: AST, ALT, ALKPHOS, BILITOT, PROT, ALBUMIN,  in the last 168 hours No results found for this basename:  LIPASE, AMYLASE,  in the last 168 hours No results found for this basename: AMMONIA,  in the last 168 hours CBC:  Recent Labs Lab 06/29/13 0335 07/02/13 0350 07/03/13 0507 07/04/13 0840 07/05/13 0425  WBC 21.3* 14.0* 13.5* 15.4* 15.2*  HGB 12.9* 11.8* 11.2* 11.7* 10.9*  HCT 37.4* 34.8* 33.9* 33.9* 33.1*  MCV 88.4 90.2 90.4 90.2 90.9  PLT 318 365 379 404* 380   Cardiac Enzymes: No results found for this basename: CKTOTAL, CKMB, CKMBINDEX,  TROPONINI,  in the last 168 hours BNP (last 3 results) No results found for this basename: PROBNP,  in the last 8760 hours CBG:  Recent Labs Lab 07/04/13 1731 07/04/13 2142 07/05/13 0749 07/05/13 1135 07/05/13 1355  GLUCAP 85 91 89 124* 48*    Recent Results (from the past 240 hour(s))  GC/CHLAMYDIA PROBE AMP     Status: None   Collection Time    06/28/13  7:17 AM      Result Value Range Status   CT Probe RNA NEGATIVE  NEGATIVE Final   GC Probe RNA NEGATIVE  NEGATIVE Final   Comment: (NOTE)                                                                                               **Normal Reference Range: Negative**          Assay performed using the Gen-Probe APTIMA COMBO2 (R) Assay.     Acceptable specimen types for this assay include APTIMA Swabs (Unisex,     endocervical, urethral, or vaginal), first void urine, and ThinPrep     liquid based cytology samples.     Performed at [facility]     Status: None   Collection Time    06/28/13  7:06 PM      Result Value Range Status   MRSA, PCR NEGATIVE  NEGATIVE Final   Staphylococcus aureus NEGATIVE  NEGATIVE Final   Comment:            The Xpert SA Assay (FDA     approved for NASAL specimens     in patients over 23 years of age),     is one component of     a comprehensive surveillance     program.  Test performance has     been validated by [employer] for patients greater     than or equal to 58 year old.     It is not intended     to diagnose infection nor to     guide or monitor treatment.  CULTURE, ROUTINE-ABSCESS     Status: None   Collection Time    06/28/13  8:11 PM      Result Value Range Status   Specimen Description ABSCESS SCROTUM   Final   Special Requests PATIENT ON FOLLOWING LEVIQUIN DOXYCYCLINE   Final   Gram Stain     Final   Value: ABUNDANT WBC PRESENT, PREDOMINANTLY PMN     NO SQUAMOUS EPITHELIAL CELLS SEEN     MODERATE GRAM POSITIVE COCCI IN PAIRS  Performed by [facility] Gram Stain Report Called to,Read Back By and Verified With: Gram Stain Report Called to,Read Back By and Verified With:  W ALLRED RN AT 2053 ON 06/28/13 A NAVARRO     Performed at [facility]   Culture     Final   Value: MODERATE GROUP B STREP(S.AGALACTIAE)ISOLATED     Note: TESTING AGAINST S. AGALACTIAE NOT ROUTINELY PERFORMED DUE TO PREDICTABILITY OF AMP/PEN/VAN SUSCEPTIBILITY.     Performed at [facility]   Report Status 07/01/2013 FINAL   Final  ANAEROBIC CULTURE     Status: None   Collection Time    06/28/13  8:11 PM      Result Value Range Status   Specimen Description ABSCESS SCROTUM   Final   Special Requests PATIENT ON FOLLOWING LEVIQUIN DOXYCYCLINE   Final   Gram Stain     Final   Value: NO WBC SEEN     NO SQUAMOUS EPITHELIAL CELLS SEEN     MODERATE GRAM POSITIVE COCCI IN PAIRS     Performed at [facility]   Culture     Final   Value: NO ANAEROBES ISOLATED     Performed at [facility]   Report Status 07/04/2013 FINAL   Final  GRAM STAIN     Status: None   Collection Time    06/28/13  8:11 PM      Result Value Range Status   Specimen Description ABSCESS SCROTUM   Final   Special Requests PATIENT ON FOLLOWING LEVIQUIN DOXYCYCLINE   Final   Gram Stain     Final   Value: NO WBC SEEN     MODERATE GRAM POSITIVE COCCI IN PAIRS     Gram Stain Report Called to,Read Back By and Verified With: Carrolyn Meiers RN 2053 06/28/13 A NAVARRO   Report Status 06/28/2013 FINAL   Final     Studies: No results found.  Scheduled Meds: . ampicillin-sulbactam (UNASYN) IV  1.5 g Intravenous Q6H  . citalopram  20 mg Oral Daily  . diphenhydrAMINE  25-50 mg Intravenous Q8H  . docusate sodium  100 mg Oral BID  . enoxaparin (LOVENOX) injection  40 mg Subcutaneous Q24H  . fenofibrate  160 mg Oral Daily  . insulin aspart  0-15 Units Subcutaneous TID WC  . insulin aspart  8 Units Subcutaneous TID WC  . insulin glargine  30 Units  Subcutaneous BID  . lisinopril  10 mg Oral Daily  . nicotine  21 mg Transdermal Daily  . QUEtiapine Fumarate  150 mg Oral QHS   Continuous Infusions: . dextrose 5 % and 0.45% NaCl 100 mL/hr at 07/04/13 2116      Time spent: 25 minutes    Merrillyn Ackerley  [facility] Pager [phone]. If 7PM-7AM, please contact night-coverage at [email or web address], password [facility] 07/05/2013, 2:06 PM  LOS: 9 days

## 2013-07-05 NOTE — Progress Notes (Signed)
Inpatient Diabetes Program Recommendations  AACE/ADA: New Consensus Statement on Inpatient Glycemic Control (2013)  Target Ranges:  Prepandial:   less than 140 mg/dL      Peak postprandial:   less than 180 mg/dL (1-2 hours)      Critically ill patients:  140 - 180 mg/dL   Reason for Visit: Hypoglycemia  Results for Charles Manning, Charles Manning (MRN [id number]) as of 07/05/2013 15:36  Ref. Range 07/05/2013 07:49 07/05/2013 11:35 07/05/2013 13:55 07/05/2013 14:11 07/05/2013 14:29  Glucose-Capillary Latest Range: 70-99 mg/dL 89 124 (H) 48 (L) 62 (L) 85   Hypoglycemia in am and after lunch. Needs insulin adjustments.    Inpatient Diabetes Program Recommendations Insulin - Basal: Decrease Lantus to 28 units bid Insulin - Meal Coverage: Reduced to Novolog 3 units tidwc d/t hypoglycemia  Note: Will continue to follow. Thank you. Lorenda Peck, RD, LDN, CDE Inpatient Diabetes Coordinator [phone]

## 2013-07-05 NOTE — Progress Notes (Signed)
[place] for Infectious Disease  Date of Admission:  06/26/2013  Antibiotics: Antibiotics Given (last 72 hours)   Date/Time Action Medication Dose Rate   07/02/13 1900 Given   ampicillin-sulbactam (UNASYN) 1.5 g in sodium chloride 0.9 % 50 mL IVPB 1.5 g 100 mL/hr   07/03/13 0047 Given   ampicillin-sulbactam (UNASYN) 1.5 g in sodium chloride 0.9 % 50 mL IVPB 1.5 g 100 mL/hr   07/03/13 0655 Given   ampicillin-sulbactam (UNASYN) 1.5 g in sodium chloride 0.9 % 50 mL IVPB 1.5 g 100 mL/hr   07/03/13 1110 Given   ampicillin-sulbactam (UNASYN) 1.5 g in sodium chloride 0.9 % 50 mL IVPB 1.5 g 100 mL/hr   07/03/13 1712 Given   ampicillin-sulbactam (UNASYN) 1.5 g in sodium chloride 0.9 % 50 mL IVPB 1.5 g 100 mL/hr   07/04/13 0004 Given   ampicillin-sulbactam (UNASYN) 1.5 g in sodium chloride 0.9 % 50 mL IVPB 1.5 g 100 mL/hr   07/04/13 0526 Given   ampicillin-sulbactam (UNASYN) 1.5 g in sodium chloride 0.9 % 50 mL IVPB 1.5 g 100 mL/hr   07/04/13 1211 Given   ampicillin-sulbactam (UNASYN) 1.5 g in sodium chloride 0.9 % 50 mL IVPB 1.5 g 100 mL/hr   07/04/13 1754 Given   ampicillin-sulbactam (UNASYN) 1.5 g in sodium chloride 0.9 % 50 mL IVPB 1.5 g 100 mL/hr   07/04/13 2342 Given   ampicillin-sulbactam (UNASYN) 1.5 g in sodium chloride 0.9 % 50 mL IVPB 1.5 g 100 mL/hr   07/05/13 0600 Given   ampicillin-sulbactam (UNASYN) 1.5 g in sodium chloride 0.9 % 50 mL IVPB 1.5 g 100 mL/hr   07/05/13 1205 Given   ampicillin-sulbactam (UNASYN) 1.5 g in sodium chloride 0.9 % 50 mL IVPB 1.5 g 100 mL/hr      Subjective: No new complaints  Objective: Temp:  [97.5 F (36.4 C)-98.6 F (37 C)] 97.5 F (36.4 C) (01/28 1420) Pulse Rate:  [59-67] 59 (01/28 1420) Resp:  [10-18] 18 (01/28 1420) BP: (111-137)/(69-85) 137/80 mmHg (01/28 1420) SpO2:  [95 %-98 %] 95 % (01/28 1420)  General: awake, alert, nad Skin: no rashes Lungs: CTA GU: wrapped  Lab Results Lab Results  Component Value Date   WBC 15.2* 07/05/2013   HGB 10.9* 07/05/2013   HCT 33.1* 07/05/2013   MCV 90.9 07/05/2013   PLT 380 07/05/2013    Lab Results  Component Value Date   CREATININE 0.86 07/05/2013   BUN 5* 07/05/2013   NA 136* 07/05/2013   K 3.8 07/05/2013   CL 99 07/05/2013   CO2 28 07/05/2013    Lab Results  Component Value Date   ALT 97* 06/24/2013   AST 72* 06/24/2013   ALKPHOS 81 06/24/2013   BILITOT 0.5 06/24/2013      Microbiology: Recent Results (from the past 240 hour(s))  GC/CHLAMYDIA PROBE AMP     Status: None   Collection Time    06/28/13  7:17 AM      Result Value Range Status   CT Probe RNA NEGATIVE  NEGATIVE Final   GC Probe RNA NEGATIVE  NEGATIVE Final   Comment: (NOTE)                                                                                               **  Normal Reference Range: Negative**          Assay performed using the Gen-Probe APTIMA COMBO2 (R) Assay.     Acceptable specimen types for this assay include APTIMA Swabs (Unisex,     endocervical, urethral, or vaginal), first void urine, and ThinPrep     liquid based cytology samples.     Performed at [facility]     Status: None   Collection Time    06/28/13  7:06 PM      Result Value Range Status   MRSA, PCR NEGATIVE  NEGATIVE Final   Staphylococcus aureus NEGATIVE  NEGATIVE Final   Comment:            The Xpert SA Assay (FDA     approved for NASAL specimens     in patients over 28 years of age),     is one component of     a comprehensive surveillance     program.  Test performance has     been validated by [employer] for patients greater     than or equal to 45 year old.     It is not intended     to diagnose infection nor to     guide or monitor treatment.  CULTURE, ROUTINE-ABSCESS     Status: None   Collection Time    06/28/13  8:11 PM      Result Value Range Status   Specimen Description ABSCESS SCROTUM   Final   Special Requests PATIENT ON FOLLOWING LEVIQUIN DOXYCYCLINE    Final   Gram Stain     Final   Value: ABUNDANT WBC PRESENT, PREDOMINANTLY PMN     NO SQUAMOUS EPITHELIAL CELLS SEEN     MODERATE GRAM POSITIVE COCCI IN PAIRS     Performed by [facility] Gram Stain Report Called to,Read Back By and Verified With: Gram Stain Report Called to,Read Back By and Verified With:  W ALLRED RN AT 2053 ON 06/28/13 A NAVARRO     Performed at [facility]   Culture     Final   Value: MODERATE GROUP B STREP(S.AGALACTIAE)ISOLATED     Note: TESTING AGAINST S. AGALACTIAE NOT ROUTINELY PERFORMED DUE TO PREDICTABILITY OF AMP/PEN/VAN SUSCEPTIBILITY.     Performed at [facility]   Report Status 07/01/2013 FINAL   Final  ANAEROBIC CULTURE     Status: None   Collection Time    06/28/13  8:11 PM      Result Value Range Status   Specimen Description ABSCESS SCROTUM   Final   Special Requests PATIENT ON FOLLOWING LEVIQUIN DOXYCYCLINE   Final   Gram Stain     Final   Value: NO WBC SEEN     NO SQUAMOUS EPITHELIAL CELLS SEEN     MODERATE GRAM POSITIVE COCCI IN PAIRS     Performed at [facility]   Culture     Final   Value: NO ANAEROBES ISOLATED     Performed at [facility]   Report Status 07/04/2013 FINAL   Final  GRAM STAIN     Status: None   Collection Time    06/28/13  8:11 PM      Result Value Range Status   Specimen Description ABSCESS SCROTUM   Final   Special Requests PATIENT ON FOLLOWING LEVIQUIN DOXYCYCLINE   Final   Gram Stain     Final   Value: NO  WBC SEEN     MODERATE GRAM POSITIVE COCCI IN PAIRS     Gram Stain Report Called to,Read Back By and Verified With: Carrolyn Meiers RN 2053 06/28/13 A NAVARRO   Report Status 06/28/2013 FINAL   Final    Studies/Results: No results found.  Assessment/Plan: 1)  Cellulitis with abscess -  culture with group B strep and on unasyn.   -can switch to po Augementin if no further debridement planned  And continue for 2 weeks after discharge and we will follow up with him then.    I  will sign off, call with questions.     Scharlene Gloss, [place] for Infectious Disease [place] [email or web address] [phone] pager   [phone] cell 07/05/2013, 3:09 PM

## 2013-07-05 NOTE — Progress Notes (Signed)
Hypoglycemic Event  CBG: 62  Treatment: 15 GM carbohydrate snack  Symptoms: Sweaty, Shaky and confusion  Follow-up CBG: Time:1429 CBG Result 85  Possible Reasons for Event: Medication regimen: meal coverage insulin  Comments/MD notified:Dhungel, MD notified     Benedetto Goad J  Remember to initiate Hypoglycemia Order Set & complete

## 2013-07-05 NOTE — Progress Notes (Signed)
Patient tolerated dressing change this afternoon very well, pain medication given 5 minutes before dressing change was started. Patient conversing with RN during dressing change. Educated patient about dressing to start the education for the discharge process.

## 2013-07-05 NOTE — Progress Notes (Signed)
Patient ID: Charles Manning, male   DOB: 09-28-1955, 58 y.o.   MRN: [id number]  Pt pain better today. Changing to po pain meds. Passing flatus.   Filed Vitals:   07/05/13 0521  BP: 128/77  Pulse: 67  Temp: 98.6 F (37 C)  Resp: 10    PE: Healthy erythema around the groin and scrotum, no fluctuance or induration. I took the packing out of the scrotum, no pain. Scrotal skin healthy apart from possible small area at inferior scrotum (distal to the nylons) and possible in ML superior scrotum, but stable. Wound bed fibrinous tissue seems to be slowly clearing.   CBC    Component Value Date/Time   WBC 15.2* 07/05/2013 0425   WBC 12.1* 11/08/2008 0951   RBC 3.64* 07/05/2013 0425   RBC 4.74 11/08/2008 0951   HGB 10.9* 07/05/2013 0425   HGB 15.3 11/08/2008 0951   HCT 33.1* 07/05/2013 0425   HCT 44.6 11/08/2008 0951   PLT 380 07/05/2013 0425   PLT 254 11/08/2008 0951   MCV 90.9 07/05/2013 0425   MCV 94.0 11/08/2008 0951   MCH 29.9 07/05/2013 0425   MCH 32.3 11/08/2008 0951   MCHC 32.9 07/05/2013 0425   MCHC 34.3 11/08/2008 0951   RDW 12.3 07/05/2013 0425   RDW 12.9 11/08/2008 0951   LYMPHSABS 2.0 06/28/2013 0555   LYMPHSABS 3.3 11/08/2008 0951   MONOABS 1.9* 06/28/2013 0555   MONOABS 1.1* 11/08/2008 0951   EOSABS 0.2 06/28/2013 0555   EOSABS 0.8* 11/08/2008 0951   BASOSABS 0.0 06/28/2013 0555   BASOSABS 0.0 11/08/2008 0951   Imp/plan: -agree with nutrition, PT/OT consults, bowel management  -Scrotal wound - stable, slow improvement. Discussed with patient. All questions answered. He wants to keep foley for now. Might d/c once he's more ambulatory.

## 2013-07-05 NOTE — Progress Notes (Signed)
5 Days Post-Op  Subjective: Pt tolerating dressing changes much better.  PCA is annoying the patient and would like to try oral pain medications.  Tolerating diet and urinating well.  No BM in many days.  Not ambulating much yet.  He wants to know what caused this condition.  When I explain that his uncontrolled sugars were the cause he seems surprised.  Very poor insight to his condition and will likely need diet education.  Objective: Vital signs in last 24 hours: Temp:  [98.1 F (36.7 C)-98.6 F (37 C)] 98.6 F (37 C) (01/28 0521) Pulse Rate:  [61-72] 67 (01/28 0521) Resp:  [10-17] 10 (01/28 0521) BP: (111-134)/(66-85) 128/77 mmHg (01/28 0521) SpO2:  [93 %-98 %] 97 % (01/28 0521) Last BM Date: 06/26/13  Intake/Output from previous day: 01/27 0701 - 01/28 0700 In: 5004.1 [P.O.:720; I.V.:3984.1; IV Piggyback:300] Out: 3700 [Urine:3700] Intake/Output this shift: Total I/O In: -  Out: 500 [Urine:500]  PE: Gen: Alert, NAD, pain much better controlled today with dressing change Skin: Inguinal wound is clean with some minor slough; scrotum with yellow slough which is improving, area of induration and patchy erythema at the superior aspect of the inguinal wound which extends to the base of the superior scrotum/base of penis   Lab Results:   Recent Labs  07/04/13 0840 07/05/13 0425  WBC 15.4* 15.2*  HGB 11.7* 10.9*  HCT 33.9* 33.1*  PLT 404* 380   BMET  Recent Labs  07/03/13 0507 07/05/13 0425  NA 139 136*  K 4.2 3.8  CL 100 99  CO2 30 28  GLUCOSE 103* 69*  BUN 7 5*  CREATININE 0.89 0.86  CALCIUM 8.4 7.9*   PT/INR No results found for this basename: LABPROT, INR,  in the last 72 hours CMP     Component Value Date/Time   NA 136* 07/05/2013 0425   K 3.8 07/05/2013 0425   CL 99 07/05/2013 0425   CO2 28 07/05/2013 0425   GLUCOSE 69* 07/05/2013 0425   BUN 5* 07/05/2013 0425   CREATININE 0.86 07/05/2013 0425   CALCIUM 7.9* 07/05/2013 0425   PROT 7.0 06/24/2013 2223   ALBUMIN 3.5 06/24/2013 2223   AST 72* 06/24/2013 2223   ALT 97* 06/24/2013 2223   ALKPHOS 81 06/24/2013 2223   BILITOT 0.5 06/24/2013 2223   GFRNONAA >90 07/05/2013 0425   GFRAA >90 07/05/2013 0425   Lipase  No results found for this basename: lipase       Studies/Results: No results found.  Anti-infectives: Anti-infectives   Start     Dose/Rate Route Frequency Ordered Stop   07/02/13 1200  ampicillin-sulbactam (UNASYN) 1.5 g in sodium chloride 0.9 % 50 mL IVPB     1.5 g 100 mL/hr over 30 Minutes Intravenous Every 6 hours 07/02/13 1038     06/30/13 1400  linezolid (ZYVOX) IVPB 600 mg  Status:  Discontinued     600 mg 300 mL/hr over 60 Minutes Intravenous Every 12 hours 06/30/13 1306 07/02/13 1038   06/29/13 1400  vancomycin (VANCOCIN) IVPB 1000 mg/200 mL premix  Status:  Discontinued     1,000 mg 80 mL/hr over 2.5 Hours Intravenous Every 8 hours 06/29/13 1315 06/30/13 1306   06/29/13 1000  linezolid (ZYVOX) IVPB 600 mg  Status:  Discontinued     600 mg 300 mL/hr over 60 Minutes Intravenous Every 12 hours 06/28/13 2030 06/29/13 1235   06/29/13 0400  piperacillin-tazobactam (ZOSYN) IVPB 3.375 g  Status:  Discontinued  3.375 g 12.5 mL/hr over 240 Minutes Intravenous Every 8 hours 06/28/13 2030 07/01/13 1104   06/28/13 2030  linezolid (ZYVOX) IVPB 600 mg     600 mg 300 mL/hr over 60 Minutes Intravenous  Once 06/28/13 2008 06/28/13 2204   06/28/13 2030  piperacillin-tazobactam (ZOSYN) IVPB 3.375 g     3.375 g 100 mL/hr over 30 Minutes Intravenous  Once 06/28/13 2009 06/28/13 2030   06/28/13 1000  levofloxacin (LEVAQUIN) tablet 500 mg  Status:  Discontinued     500 mg Oral Daily 06/27/13 1405 06/28/13 2006   06/27/13 1200  levofloxacin (LEVAQUIN) IVPB 750 mg  Status:  Discontinued     750 mg 100 mL/hr over 90 Minutes Intravenous Every 24 hours 06/27/13 1055 06/27/13 1405   06/27/13 0015  doxycycline (VIBRA-TABS) tablet 100 mg  Status:  Discontinued     100 mg Oral Every 12 hours  06/26/13 2354 06/28/13 2009   06/26/13 2200  clindamycin (CLEOCIN) IVPB 300 mg  Status:  Discontinued     300 mg 100 mL/hr over 30 Minutes Intravenous 3 times per day 06/26/13 2100 06/27/13 1400   06/26/13 2115  cefTRIAXone (ROCEPHIN) 1 g in dextrose 5 % 50 mL IVPB     1 g 100 mL/hr over 30 Minutes Intravenous  Once 06/26/13 2100 06/26/13 2150   06/26/13 1500  clindamycin (CLEOCIN) IVPB 600 mg     600 mg 100 mL/hr over 30 Minutes Intravenous  Once 06/26/13 1437 06/26/13 1543       Assessment/Plan 1. Fournier's gangrene of the scrotum and inguinal abscess  1a. Incision and drainage of Fournier's gangrene of the scrotum and insertion of Foley catheter (assistant Dr. Marlou Starks in incision and drainage of inguinal abscess)06/28/2013, Reece Packer, MD  1b. INCISION AND DRAINAGE SCROTAL ABSCESS, 06/28/2013 Dr. Marlou Starks  1c Right scrotal debridement Scrotal reconstruction, 06/30/2013 [name], MD   2. Hx of right thigh dissection and melanoma resection 2005.  3. Basal Cell Ca mid back resection 2007.  4. AODM with poor control  5. Tobacco use 40 years  6. Chronic back and lower leg pain  7. Dyslipidemia  8. Anxiety and depression  9. DJD   Plan:  1. Dressing changes WD BID with oral medication if able 2. Urology not planning on re-debridement, awaiting the wound to look a bit cleaner before asking wound care to potentially place a wound vac. Question whether santyl would be appropriate in this setting vs hydrotherapy.  3. Need to get this patient up OOB and ambulating (Looks like PT wants to be re consulted when the patient agrees to get up OOB)  4. SCD's and lovenox 5. D/C PCA, switch to oral pain meds, IV for breakthrough 6. Needs dietitian and PT/OT.    LOS: 9 days    DORT, Jinny Blossom 07/05/2013, 7:51 AM Pager: [phone]

## 2013-07-05 NOTE — Progress Notes (Signed)
Hypoglycemic Event  CBG: 48  Treatment: 15 GM carbohydrate snack  Symptoms: Pale, Sweaty, Shaky and confusion  Follow-up CBG: Time:1411 CBG Result:62  Possible Reasons for Event: Medication regimen: meal coverage insulin; MD notified  Comments/MD notified:Dhungel MD notified; changed meal coverage insulin    Benedetto Goad J  Remember to initiate Hypoglycemia Order Set & complete

## 2013-07-06 LAB — GLUCOSE, CAPILLARY
GLUCOSE-CAPILLARY: 89 mg/dL (ref 70–99)
GLUCOSE-CAPILLARY: 74 mg/dL (ref 70–99)
GLUCOSE-CAPILLARY: 116 mg/dL — AB (ref 70–99)
Glucose-Capillary: 88 mg/dL (ref 70–99)
Glucose-Capillary: 96 mg/dL (ref 70–99)

## 2013-07-06 MED ORDER — AMOXICILLIN-POT CLAVULANATE 875-125 MG PO TABS
1.0000 | ORAL_TABLET | Freq: Two times a day (BID) | ORAL | Status: DC
  Administered 2013-07-06 – 2013-07-07 (×3): 1 via ORAL
  Filled 2013-07-06 (×4): qty 1

## 2013-07-06 MED ORDER — HYDROMORPHONE HCL PF 1 MG/ML IJ SOLN
0.5000 mg | INTRAMUSCULAR | Status: DC | PRN
  Administered 2013-07-06: 23:00:00 via INTRAVENOUS
  Administered 2013-07-06 – 2013-07-07 (×2): 0.5 mg via INTRAVENOUS
  Filled 2013-07-06 (×3): qty 1

## 2013-07-06 MED ORDER — FAMOTIDINE 20 MG PO TABS
20.0000 mg | ORAL_TABLET | Freq: Two times a day (BID) | ORAL | Status: DC | PRN
  Filled 2013-07-06: qty 1

## 2013-07-06 MED ORDER — OXYCODONE-ACETAMINOPHEN 5-325 MG PO TABS
1.0000 | ORAL_TABLET | Freq: Four times a day (QID) | ORAL | Status: DC | PRN
  Administered 2013-07-06 – 2013-07-07 (×4): 2 via ORAL
  Filled 2013-07-06 (×4): qty 2

## 2013-07-06 MED ORDER — ALUM & MAG HYDROXIDE-SIMETH 200-200-20 MG/5ML PO SUSP
30.0000 mL | Freq: Four times a day (QID) | ORAL | Status: DC | PRN
  Administered 2013-07-06: 30 mL via ORAL
  Filled 2013-07-06: qty 30

## 2013-07-06 NOTE — Progress Notes (Signed)
CSW met with pt to assist with d/c planning. PT recommends HHPT. Pt would like to return home following hospital d/c. Spoke with pt's nsg. She will have pt provide his own wound care with her teaching / supervision tonite. Pt is in agreement with this plan. CSW will meet with pt again in the am. If he is able to manage his wound care he will d/c home. Pt states [facility] will [facility] if he feels he is unable to care for his wound. [facility] is able to assist at home but not multiple times daily. Will update RNCM for possible d/c home 1/30.  Werner Lean LCSW [phone]

## 2013-07-06 NOTE — Progress Notes (Signed)
Agree with above 

## 2013-07-06 NOTE — Progress Notes (Signed)
Patient ID: Charles Manning, male   DOB: 15-Apr-1956, 58 y.o.   MRN: [id number]  Patient is without complaints.  He is trying to take a nap.  I reviewed PA Dort's note and it appears the wound is stable.  Also patient having less pain and working with PT now.  He would like to get the Foley out.  I will DC the Foley at 6 AM tomorrow morning.

## 2013-07-06 NOTE — Progress Notes (Signed)
Physical Therapy Treatment Patient Details Name: Charles Manning MRN: [id number] DOB: April 18, 1956 Today's Date: 07/06/2013 Time: 3149-7026 PT Time Calculation (min): 26 min  PT Assessment / Plan / Recommendation  History of Present Illness Charles Manning is a 58 y.o. male with PMH of uncontrolled DM, chronic pain, HTN, depression, h/o melanoma resection from R groin in 2005, and chronic R leg lymphedema presents to the ER today with the above complaints.   PT Comments   Pt doing much better this pm; able to amb further with less pain; will benefit from OT consult  Follow Up Recommendations  Home health PT     Does the patient have the potential to tolerate intense rehabilitation     Barriers to Discharge        Equipment Recommendations  None recommended by PT    Recommendations for Other Services OT consult  Frequency Min 3X/week   Progress towards PT Goals Progress towards PT goals: Progressing toward goals  Plan Current plan remains appropriate    Precautions / Restrictions Precautions Precautions: Fall   Pertinent Vitals/Pain     Mobility  Bed Mobility Overal bed mobility: Modified Independent Transfers Overall transfer level: Needs assistance Equipment used: None Transfers: Sit to/from Stand Sit to Stand: Min guard General transfer comment: for  safety Ambulation/Gait Ambulation/Gait assistance: Supervision Ambulation Distance (Feet): 250 Feet Assistive device: None Gait Pattern/deviations: Step-through pattern;Decreased stride length;Wide base of support    Exercises     PT Diagnosis:    PT Problem List:   PT Treatment Interventions:     PT Goals (current goals can now be found in the care plan section) Acute Rehab PT Goals Patient Stated Goal: Walk without pain PT Goal Formulation: With patient Time For Goal Achievement: 07/16/13 Potential to Achieve Goals: Good  Visit Information  Last PT Received On: 07/06/13 Assistance Needed: +2 History of  Present Illness: Charles Manning is a 58 y.o. male with PMH of uncontrolled DM, chronic pain, HTN, depression, h/o melanoma resection from R groin in 2005, and chronic R leg lymphedema presents to the ER today with the above complaints.    Subjective Data  Patient Stated Goal: Walk without pain   Cognition  Cognition Arousal/Alertness: Awake/alert Behavior During Therapy: WFL for tasks assessed/performed Overall Cognitive Status: Within Functional Limits for tasks assessed    Balance     End of Session PT - End of Session Activity Tolerance: Patient tolerated treatment well Patient left: Other (comment)   GP     Charles Manning 07/06/2013, 1:11 PM

## 2013-07-06 NOTE — Progress Notes (Signed)
Clinical Social Work Department CLINICAL SOCIAL WORK PLACEMENT NOTE 07/06/2013  Patient:  Charles Manning, Charles Manning  Account Number:  [id number] Admit date:  06/26/2013  Clinical Social Worker:  Werner Lean, LCSW  Date/time:  07/06/2013 01:33 PM  Clinical Social Work is seeking post-discharge placement for this patient at the following level of care:   SKILLED NURSING   (*CSW will update this form in Epic as items are completed)   07/06/2013  Patient/family provided with [facility] Department of Clinical Social Work's list of facilities offering this level of care within the geographic area requested by the patient (or if unable, by the patient's family).  07/06/2013  Patient/family informed of their freedom to choose among providers that offer the needed level of care, that participate in Medicare, Medicaid or managed care program needed by the patient, have an available bed and are willing to accept the patient.    Patient/family informed of [facility]' ownership interest in [facility], as well as of the fact that they are under no obligation to receive care at this facility.  PASARR submitted to EDS on 07/06/2013 PASARR number received from EDS on 07/06/2013  FL2 transmitted to all facilities in geographic area requested by pt/family on  07/06/2013 FL2 transmitted to all facilities within larger geographic area on   Patient informed that his/her managed care company has contracts with or will negotiate with  certain facilities, including the following:     Patient/family informed of bed offers received:   Patient chooses bed at  Physician recommends and patient chooses bed at    Patient to be transferred to  on   Patient to be transferred to facility by   The following physician request were entered in Epic:   Additional Comments:   Werner Lean LCSW [phone]

## 2013-07-06 NOTE — Progress Notes (Signed)
6 Days Post-Op  Subjective: Pt's pain better controlled on orals.  No N/V, tolerating diet well.  Dressing changes are getting easier.  Changed it without pre-medicating today.  Objective: Vital signs in last 24 hours: Temp:  [97.5 F (36.4 C)-98.5 F (36.9 C)] 98.5 F (36.9 C) (01/29 0547) Pulse Rate:  [59-61] 61 (01/29 0547) Resp:  [18] 18 (01/29 0547) BP: (117-137)/(71-80) 117/71 mmHg (01/29 0547) SpO2:  [92 %-95 %] 92 % (01/29 0547) Last BM Date: 07/05/13  Intake/Output from previous day: 01/28 0701 - 01/29 0700 In: 1160 [P.O.:1160] Out: 3350 [Urine:3350] Intake/Output this shift:    PE: Gen:  Alert, NAD, pleasant Skin: Inguinal wound is much cleaner with some minimal nonadherent slough; scrotum with less yellow slough which is improving, area of induration and patchy erythema at the superior aspect of the inguinal wound which extends to the base of the superior scrotum/base of penis is much improved and will not likely need debrided   Lab Results:   Recent Labs  07/04/13 0840 07/05/13 0425  WBC 15.4* 15.2*  HGB 11.7* 10.9*  HCT 33.9* 33.1*  PLT 404* 380   BMET  Recent Labs  07/05/13 0425  NA 136*  K 3.8  CL 99  CO2 28  GLUCOSE 69*  BUN 5*  CREATININE 0.86  CALCIUM 7.9*   PT/INR No results found for this basename: LABPROT, INR,  in the last 72 hours CMP     Component Value Date/Time   NA 136* 07/05/2013 0425   K 3.8 07/05/2013 0425   CL 99 07/05/2013 0425   CO2 28 07/05/2013 0425   GLUCOSE 69* 07/05/2013 0425   BUN 5* 07/05/2013 0425   CREATININE 0.86 07/05/2013 0425   CALCIUM 7.9* 07/05/2013 0425   PROT 7.0 06/24/2013 2223   ALBUMIN 3.5 06/24/2013 2223   AST 72* 06/24/2013 2223   ALT 97* 06/24/2013 2223   ALKPHOS 81 06/24/2013 2223   BILITOT 0.5 06/24/2013 2223   GFRNONAA >90 07/05/2013 0425   GFRAA >90 07/05/2013 0425   Lipase  No results found for this basename: lipase       Studies/Results: No results  found.  Anti-infectives: Anti-infectives   Start     Dose/Rate Route Frequency Ordered Stop   07/02/13 1200  ampicillin-sulbactam (UNASYN) 1.5 g in sodium chloride 0.9 % 50 mL IVPB     1.5 g 100 mL/hr over 30 Minutes Intravenous Every 6 hours 07/02/13 1038     06/30/13 1400  linezolid (ZYVOX) IVPB 600 mg  Status:  Discontinued     600 mg 300 mL/hr over 60 Minutes Intravenous Every 12 hours 06/30/13 1306 07/02/13 1038   06/29/13 1400  vancomycin (VANCOCIN) IVPB 1000 mg/200 mL premix  Status:  Discontinued     1,000 mg 80 mL/hr over 2.5 Hours Intravenous Every 8 hours 06/29/13 1315 06/30/13 1306   06/29/13 1000  linezolid (ZYVOX) IVPB 600 mg  Status:  Discontinued     600 mg 300 mL/hr over 60 Minutes Intravenous Every 12 hours 06/28/13 2030 06/29/13 1235   06/29/13 0400  piperacillin-tazobactam (ZOSYN) IVPB 3.375 g  Status:  Discontinued     3.375 g 12.5 mL/hr over 240 Minutes Intravenous Every 8 hours 06/28/13 2030 07/01/13 1104   06/28/13 2030  linezolid (ZYVOX) IVPB 600 mg     600 mg 300 mL/hr over 60 Minutes Intravenous  Once 06/28/13 2008 06/28/13 2204   06/28/13 2030  piperacillin-tazobactam (ZOSYN) IVPB 3.375 g     3.375 g 100  mL/hr over 30 Minutes Intravenous  Once 06/28/13 2009 06/28/13 2030   06/28/13 1000  levofloxacin (LEVAQUIN) tablet 500 mg  Status:  Discontinued     500 mg Oral Daily 06/27/13 1405 06/28/13 2006   06/27/13 1200  levofloxacin (LEVAQUIN) IVPB 750 mg  Status:  Discontinued     750 mg 100 mL/hr over 90 Minutes Intravenous Every 24 hours 06/27/13 1055 06/27/13 1405   06/27/13 0015  doxycycline (VIBRA-TABS) tablet 100 mg  Status:  Discontinued     100 mg Oral Every 12 hours 06/26/13 2354 06/28/13 2009   06/26/13 2200  clindamycin (CLEOCIN) IVPB 300 mg  Status:  Discontinued     300 mg 100 mL/hr over 30 Minutes Intravenous 3 times per day 06/26/13 2100 06/27/13 1400   06/26/13 2115  cefTRIAXone (ROCEPHIN) 1 g in dextrose 5 % 50 mL IVPB     1 g 100 mL/hr  over 30 Minutes Intravenous  Once 06/26/13 2100 06/26/13 2150   06/26/13 1500  clindamycin (CLEOCIN) IVPB 600 mg     600 mg 100 mL/hr over 30 Minutes Intravenous  Once 06/26/13 1437 06/26/13 1543       Assessment/Plan 1. Fournier's gangrene of the scrotum and inguinal abscess  1a. Incision and drainage of Fournier's gangrene of the scrotum and insertion of Foley catheter (assistant Dr. Marlou Starks in incision and drainage of inguinal abscess)06/28/2013, Reece Packer, MD  1b. INCISION AND DRAINAGE SCROTAL ABSCESS, 06/28/2013 Dr. Marlou Starks  1c Right scrotal debridement Scrotal reconstruction, 06/30/2013 [name], MD  2. Hx of right thigh dissection and melanoma resection 2005.  3. Basal Cell Ca mid back resection 2007.  4. AODM with poor control  5. Tobacco use 40 years  6. Chronic back and lower leg pain  7. Dyslipidemia  8. Anxiety and depression  9. DJD   Plan:  1. Dressing changes WD BID with oral medication if able  2. Urology not planning on re-debridement, awaiting the wound to look a bit cleaner before asking wound care to potentially place a wound vac.  Hydrotherapy may be helpful. Long term will likely need plastics to do a flap.  Dr. Theodoro Kos may be a good point of contact for urology. 3. Need to get this patient up OOB and ambulating  4. SCD's and lovenox  5. D/C PCA, switch to oral pain meds, IV for breakthrough  6. Met with dietitian and needs PT/OT, not mobilizing OOB much will significantly decrease his healing capacity 7. Will sign off, call with questions/concerns.    LOS: 10 days    Coralie Keens 07/06/2013, 7:56 AM Pager: [phone]

## 2013-07-06 NOTE — Progress Notes (Signed)
NUTRITION FOLLOW UP  Intervention:   - Pt eating excellent without any nutrition concerns or educational needs. Reviewed diabetic diet and provided handouts of information earlier in admission which pt states he reviewed and did not have any questions.  - Recommend outpatient DM nutrition follow up at [place] signing off, please consult if needed  Nutrition Dx:   Inadequate oral intake related to clear liquid diet as evidenced by diet order - resolved, diet advanced to diabetic diet, pt eating 100% of meals   Goal:   Advance diet as tolerated to diabetic diet - met   Assessment:   Pt with hx of uncontrolled DM, chronic pain, HTN, depression, h/o melanoma resection from R groin in 2005, and chronic R leg lymphedema presents to the ER today with the above complaints.  He noticed pain and redness in the R groin 2 days ago, the pain and redness extended down to his scrotum. He reports subjective fevers and chills. In ER, noted to have hyperglycemia, leukocytosis and [place] with R epididymitis vs cellulitis. Had incision and drainage of Fournier's gangrene of the scrotum yesterday.   1/22 - Met with pt who reports eating well at home with good appetite, 2 meals/day. Admits to not following diabetic diet strictly at home but does only drink water or diet soda. Tolerating clear liquid diet. Provided education on low tyramine diet as pt on an MAOI and also reviewed diabetic diet. Handouts provided with RD contact information.  1/29 - Reviewed events since last RD visit. Had scrotum debridement 1/23. Diet advanced to diabetic diet 1/23. PO intake 75-100% of meals. Met with pt who reports eating well. Denied any diabetic diet questions.     Height: Ht Readings from Last 1 Encounters:  06/28/13 '5\' 10"'  (1.778 m)    Weight Status:   Wt Readings from Last 1 Encounters:  07/02/13 220 lb 0.3 oz (99.8 kg)  Admit wt:        194 lb 10.7 oz (88.3 kg)   Net I/Os:  +8.5L  Re-estimated needs:  Kcal: 1900-2100  Protein: 90-115g  Fluid: 1.9-2.1L/day   Skin: non-pitting RLE, LLE edema, +2 perineal edema, scrotal incision    Diet Order: Carb Control   Intake/Output Summary (Last 24 hours) at 07/06/13 1045 Last data filed at 07/06/13 0820  Gross per 24 hour  Intake   1160 ml  Output   2850 ml  Net  -1690 ml    Last BM: 1/28   Labs:   Recent Labs Lab 07/03/13 0507 07/05/13 0425  NA 139 136*  K 4.2 3.8  CL 100 99  CO2 30 28  BUN 7 5*  CREATININE 0.89 0.86  CALCIUM 8.4 7.9*  GLUCOSE 103* 69*    CBG (last 3)   Recent Labs  07/05/13 1745 07/05/13 2051 07/06/13 0819  GLUCAP 146* 88 89    Scheduled Meds: . ampicillin-sulbactam (UNASYN) IV  1.5 g Intravenous Q6H  . citalopram  20 mg Oral Daily  . diphenhydrAMINE  25-50 mg Intravenous Q8H  . docusate sodium  100 mg Oral BID  . enoxaparin (LOVENOX) injection  40 mg Subcutaneous Q24H  . fenofibrate  160 mg Oral Daily  . insulin aspart  0-15 Units Subcutaneous TID WC  . insulin aspart  3 Units Subcutaneous TID WC  . insulin glargine  30 Units Subcutaneous BID  . lisinopril  10 mg Oral Daily  . nicotine  21 mg Transdermal Daily  . QUEtiapine Fumarate  150 mg Oral QHS    Continuous Infusions: . dextrose 5 % and 0.45% NaCl 100 mL/hr at 07/06/13 [place], [place], [place] Pager [phone] After Hours Pager

## 2013-07-06 NOTE — Progress Notes (Signed)
[facility] PROGRESS NOTE  Charles Manning PXT:[id number] DOB: 05-27-1956 DOA: 06/26/2013 PCP: Benito Mccreedy, MD  Brief narrative  58 y/o male with HTN, chr pain, uncontrolled DM presented with cellulitis and abscess of scrotum.underwent surgery I/D on 1/21 per Urology and General Surgery. Cultures have grown GBS and ID is following. Since surgery, the patient had undergone further debridement on 1/23 by Urology. Currently the patient is continuing with wound care and pain management.   Assessment/Plan:  Right epididymo-orchitis/Fournier's gangrene  - Initially started on empiric coverage with IV clindamycin and doxycycline.  - Wound cx growing GBS  - has been on multiple abx since admission. Now on IV unasyn. Dr Linus Salmons  recommended 2 week course of Augmentin upon discharge ( needs abx until wound fully healed) . will be followed as outpt . - Appreciate urology and general surgery recommendations  - Wound care per Surgery  - continue IV fluids and prn antiemetics.  -Seen by PT and recommended home health -Gen. surgery signed off. I recommend may benefit from plastic surgery consult for possible flap. We discussed with urology.   Narcotic dependence Patient has been on quite a frequent dose of narcotics. i will reduce frequency of Dilaudid and Percocet.   uncontrolled DM  - Initially persistent hyperglycemia overnight with fsg in 500s with AG of 15.  - Was placed on glucose stabilizer. Now improved and AG closed.  --Insulin dose adjusted after her episode of hypoglycemia on 1/28. Now stable  Depression  -continue celexa and seroquel   Hyponatremia  -due to hyperglycemia and dehydration  -now resolved   Malnutrition  nutrition consult appreciated   DVT proph: lovenox  Diet: diabetic   Code Status: full  Family Communication: none at bedside  Disposition Plan: home with [facility] vs SNF   Consultants:  ID  UROLOGY  Surgery  Procedures:  Debridement of scrotal  wound  Antibiotics:  IV clinda ( 1/19>>06/27/13)  IV doxycycline 1/191/5>>>06/30/13  IV levaquin ( 1/20>>06/30/13)  Linezolid 06/30/13>>>07/03/13  Zosyn 06/29/13>>>07/01/13  Unasyn 07/03/13>>> 1/29  Augmentin 1/29>  HPI/Subjective:  Patient seen and examined this morning. No Overnight issues   Objective: Filed Vitals:   07/06/13 0547  BP: 117/71  Pulse: 61  Temp: 98.5 F (36.9 C)  Resp: 18    Intake/Output Summary (Last 24 hours) at 07/06/13 1401 Last data filed at 07/06/13 0820  Gross per 24 hour  Intake    800 ml  Output   1750 ml  Net   -950 ml   Filed Weights   06/26/13 2300 06/28/13 2300 07/02/13 0400  Weight: 88.3 kg (194 lb 10.7 oz) 96.2 kg (212 lb 1.3 oz) 99.8 kg (220 lb 0.3 oz)    Exam:  General: Middle aged male in NAD  HEENT: no pallor, moist mucosa  Chest: clear b/l, no added sounds  CVS: NS1&S2, no murmurs  Abd: soft, NT, ND, BS+ wound packing over scrotum, appears clean  Ext: warm, no edema  CNS: AAOX3  Data Reviewed: Basic Metabolic Panel:  Recent Labs Lab 07/03/13 0507 07/05/13 0425  NA 139 136*  K 4.2 3.8  CL 100 99  CO2 30 28  GLUCOSE 103* 69*  BUN 7 5*  CREATININE 0.89 0.86  CALCIUM 8.4 7.9*   Liver Function Tests: No results found for this basename: AST, ALT, ALKPHOS, BILITOT, PROT, ALBUMIN,  in the last 168 hours No results found for this basename: LIPASE, AMYLASE,  in the last 168 hours No results found for this basename: AMMONIA,  in the last 168 hours CBC:  Recent Labs Lab 07/02/13 0350 07/03/13 0507 07/04/13 0840 07/05/13 0425  WBC 14.0* 13.5* 15.4* 15.2*  HGB 11.8* 11.2* 11.7* 10.9*  HCT 34.8* 33.9* 33.9* 33.1*  MCV 90.2 90.4 90.2 90.9  PLT 365 379 404* 380   Cardiac Enzymes: No results found for this basename: CKTOTAL, CKMB, CKMBINDEX, TROPONINI,  in the last 168 hours BNP (last 3 results) No results found for this basename: PROBNP,  in the last 8760 hours CBG:  Recent Labs Lab 07/05/13 1745 07/05/13 2051  07/06/13 0819 07/06/13 1214 07/06/13 1313  GLUCAP 146* 88 89 74 116*    Recent Results (from the past 240 hour(s))  GC/CHLAMYDIA PROBE AMP     Status: None   Collection Time    06/28/13  7:17 AM      Result Value Range Status   CT Probe RNA NEGATIVE  NEGATIVE Final   GC Probe RNA NEGATIVE  NEGATIVE Final   Comment: (NOTE)                                                                                               **Normal Reference Range: Negative**          Assay performed using the Gen-Probe APTIMA COMBO2 (R) Assay.     Acceptable specimen types for this assay include APTIMA Swabs (Unisex,     endocervical, urethral, or vaginal), first void urine, and ThinPrep     liquid based cytology samples.     Performed at [facility]     Status: None   Collection Time    06/28/13  7:06 PM      Result Value Range Status   MRSA, PCR NEGATIVE  NEGATIVE Final   Staphylococcus aureus NEGATIVE  NEGATIVE Final   Comment:            The Xpert SA Assay (FDA     approved for NASAL specimens     in patients over 56 years of age),     is one component of     a comprehensive surveillance     program.  Test performance has     been validated by [employer] for patients greater     than or equal to 75 year old.     It is not intended     to diagnose infection nor to     guide or monitor treatment.  CULTURE, ROUTINE-ABSCESS     Status: None   Collection Time    06/28/13  8:11 PM      Result Value Range Status   Specimen Description ABSCESS SCROTUM   Final   Special Requests PATIENT ON FOLLOWING LEVIQUIN DOXYCYCLINE   Final   Gram Stain     Final   Value: ABUNDANT WBC PRESENT, PREDOMINANTLY PMN     NO SQUAMOUS EPITHELIAL CELLS SEEN     MODERATE GRAM POSITIVE COCCI IN PAIRS     Performed by [facility] Gram Stain Report Called to,Read Back By and Verified With: Gram Stain Report Called to,Read  Back By and Verified With:  W ALLRED RN AT 2053 ON  06/28/13 A NAVARRO     Performed at [facility]   Culture     Final   Value: MODERATE GROUP B STREP(S.AGALACTIAE)ISOLATED     Note: TESTING AGAINST S. AGALACTIAE NOT ROUTINELY PERFORMED DUE TO PREDICTABILITY OF AMP/PEN/VAN SUSCEPTIBILITY.     Performed at [facility]   Report Status 07/01/2013 FINAL   Final  ANAEROBIC CULTURE     Status: None   Collection Time    06/28/13  8:11 PM      Result Value Range Status   Specimen Description ABSCESS SCROTUM   Final   Special Requests PATIENT ON FOLLOWING LEVIQUIN DOXYCYCLINE   Final   Gram Stain     Final   Value: NO WBC SEEN     NO SQUAMOUS EPITHELIAL CELLS SEEN     MODERATE GRAM POSITIVE COCCI IN PAIRS     Performed at [facility]   Culture     Final   Value: NO ANAEROBES ISOLATED     Performed at [facility]   Report Status 07/04/2013 FINAL   Final  GRAM STAIN     Status: None   Collection Time    06/28/13  8:11 PM      Result Value Range Status   Specimen Description ABSCESS SCROTUM   Final   Special Requests PATIENT ON FOLLOWING LEVIQUIN DOXYCYCLINE   Final   Gram Stain     Final   Value: NO WBC SEEN     MODERATE GRAM POSITIVE COCCI IN PAIRS     Gram Stain Report Called to,Read Back By and Verified With: Carrolyn Meiers RN 2053 06/28/13 A NAVARRO   Report Status 06/28/2013 FINAL   Final     Studies: No results found.  Scheduled Meds: . ampicillin-sulbactam (UNASYN) IV  1.5 g Intravenous Q6H  . citalopram  20 mg Oral Daily  . diphenhydrAMINE  25-50 mg Intravenous Q8H  . docusate sodium  100 mg Oral BID  . enoxaparin (LOVENOX) injection  40 mg Subcutaneous Q24H  . fenofibrate  160 mg Oral Daily  . insulin aspart  0-15 Units Subcutaneous TID WC  . insulin aspart  3 Units Subcutaneous TID WC  . insulin glargine  30 Units Subcutaneous BID  . lisinopril  10 mg Oral Daily  . nicotine  21 mg Transdermal Daily  . QUEtiapine Fumarate  150 mg Oral QHS   Continuous Infusions: . dextrose 5 % and 0.45%  NaCl 100 mL/hr at 07/06/13 0542       Time spent: 25 minutes    Sarajane Fambrough, [facility] Pager [phone] If 7PM-7AM, please contact night-coverage at [email or web address], password [facility] 07/06/2013, 2:01 PM  LOS: 10 days

## 2013-07-06 NOTE — Progress Notes (Signed)
Clinical Social Work Department BRIEF PSYCHOSOCIAL ASSESSMENT 07/06/2013  Patient:  JOE, GEE     Account Number:  [id number]     Admit date:  06/26/2013  Clinical Social Worker:  Lacie Scotts  Date/Time:  07/06/2013 01:11 PM  Referred by:  Care Management  Date Referred:  07/06/2013 Referred for  SNF Placement   Other Referral:   Interview type:  Patient Other interview type:    PSYCHOSOCIAL DATA Living Status:  ALONE Admitted from facility:   Level of care:   Primary support name:  Fulton Mole Primary support relationship to patient:  FRIEND Degree of support available:   unclear    CURRENT CONCERNS Current Concerns  Post-Acute Placement   Other Concerns:    SOCIAL WORK ASSESSMENT / PLAN Pt is a 58 yr old gentleman living at home prior to hospitalization. CSW met with pt to assist with d/c planning. ST SNF placement will be needed following hospital d/c. Pt has reluctantly agreed to accept placement. SNF search has been initiated and bed offers will be provided this afternoon. CSW will continue to follow to assist with d/c planning.   Assessment/plan status:  Psychosocial Support/Ongoing Assessment of Needs Other assessment/ plan:   Information/referral to community resources:   Insurance coverage for SNF placement reviewed.    PATIENT'S/FAMILY'S RESPONSE TO PLAN OF CARE: Pt has financial concerns r/t co pays for SNF. He realizes he is unable to manage his own care at home and is willing to accept a short stay at a facility.   Werner Lean LCSW [phone]

## 2013-07-07 DIAGNOSIS — I10 Essential (primary) hypertension: Secondary | ICD-10-CM | POA: Diagnosis present

## 2013-07-07 DIAGNOSIS — B1920 Unspecified viral hepatitis C without hepatic coma: Secondary | ICD-10-CM | POA: Diagnosis present

## 2013-07-07 LAB — GLUCOSE, CAPILLARY
GLUCOSE-CAPILLARY: 120 mg/dL — AB (ref 70–99)
GLUCOSE-CAPILLARY: 74 mg/dL (ref 70–99)
GLUCOSE-CAPILLARY: 94 mg/dL (ref 70–99)
Glucose-Capillary: 75 mg/dL (ref 70–99)
Glucose-Capillary: 70 mg/dL (ref 70–99)
Glucose-Capillary: 118 mg/dL — ABNORMAL HIGH (ref 70–99)

## 2013-07-07 LAB — CBC
HCT: 33.7 % — ABNORMAL LOW (ref 39.0–52.0)
Hemoglobin: 11.3 g/dL — ABNORMAL LOW (ref 13.0–17.0)
MCH: 30.3 pg (ref 26.0–34.0)
MCHC: 33.5 g/dL (ref 30.0–36.0)
MCV: 90.3 fL (ref 78.0–100.0)
PLATELETS: 451 10*3/uL — AB (ref 150–400)
RBC: 3.73 MIL/uL — ABNORMAL LOW (ref 4.22–5.81)
RDW: 12.3 % (ref 11.5–15.5)
WBC: 15.2 10*3/uL — ABNORMAL HIGH (ref 4.0–10.5)

## 2013-07-07 MED ORDER — OXYCODONE-ACETAMINOPHEN 5-325 MG PO TABS: 2.0000 | ORAL_TABLET | Freq: Four times a day (QID) | ORAL | Status: DC | PRN

## 2013-07-07 MED ORDER — DSS 100 MG PO CAPS: 100.0000 mg | ORAL_CAPSULE | Freq: Every day | ORAL | Status: DC | PRN

## 2013-07-07 MED ORDER — NICOTINE 21 MG/24HR TD PT24: 21.0000 mg | MEDICATED_PATCH | Freq: Every day | TRANSDERMAL | Status: DC

## 2013-07-07 MED ORDER — INSULIN ASPART PROT & ASPART (70-30 MIX) 100 UNIT/ML ~~LOC~~ SUSP: 35.0000 [IU] | Freq: Two times a day (BID) | SUBCUTANEOUS | Status: DC

## 2013-07-07 MED ORDER — AMOXICILLIN-POT CLAVULANATE 875-125 MG PO TABS: 1.0000 | ORAL_TABLET | Freq: Two times a day (BID) | ORAL | Status: DC

## 2013-07-07 NOTE — Progress Notes (Signed)
Inpatient Diabetes Program Recommendations  AACE/ADA: New Consensus Statement on Inpatient Glycemic Control (2013)  Target Ranges:  Prepandial:   less than 140 mg/dL      Peak postprandial:   less than 180 mg/dL (1-2 hours)      Critically ill patients:  140 - 180 mg/dL    Results for AADYN, BUCHHEIT (MRN [id number]) as of 07/07/2013 09:36  Ref. Range 07/06/2013 08:19 07/06/2013 12:14 07/06/2013 13:13 07/06/2013 18:09 07/06/2013 22:01 07/07/2013 03:01 07/07/2013 07:20 07/07/2013 07:43  Glucose-Capillary Latest Range: 70-99 mg/dL 89 74 116 (H) 96 120 (H) 75 70 74   Blood sugars well controlled, although had hypoglycemia 2 days ago.  Decrease Lantus to 28 units bid to avoid hypoglycemia. Will continue to follow. Prior to discharge, insulin will need to be converted to 70/30 since pt was previously on this. Dosage will need to be adjusted. Will need close f/u with PCP to manage diabetes.  Thank you. Lorenda Peck, RD, LDN, CDE Inpatient Diabetes Coordinator [phone]

## 2013-07-07 NOTE — Discharge Summary (Addendum)
Physician Discharge Summary  GIBRIL MASTRO VOH:[id number] DOB: 1956-03-25 DOA: 06/26/2013  PCP: follows at [place] [facility] Everardo Beals, NP) Admit date: 06/26/2013 Discharge date: 07/07/2013  Time spent: 40 minutes  Recommendations for Outpatient Follow-up:  Follow up with urology , PCP and ID in 2 weeks Home with HHRN, PR , SW and aide Needs to establish a PCP in the community, ( patient has been going to [place] urgent care on as needed basis) Patient needs outpt GI / ID referral for HCV   Discharge Diagnoses:  Principal Problem:   Cellulitis of groin, right  Active Problems:   Diabetes mellitus type 2 with complications, uncontrolled   Anxiety   Chronic pain syndrome   Epididymitis   Hypoglycemia, unspecified   Essential hypertension, benign   Hepatitis C reactive   Discharge Condition:   Diet recommendation:   Filed Weights   06/26/13 2300 06/28/13 2300 07/02/13 0400  Weight: 88.3 kg (194 lb 10.7 oz) 96.2 kg (212 lb 1.3 oz) 99.8 kg (220 lb 0.3 oz)    History of present illness:  58 y/o male with HTN, chr pain, uncontrolled DM presented with cellulitis and abscess of scrotum.underwent surgery I/D on 1/21 per Urology and General Surgery. Cultures have grown GBS and ID is following. Since surgery, the patient had undergone further debridement on 1/23 by Urology. Currently the patient is continuing with wound care and pain management.   Hospital Course:  Right epididymo-orchitis/Fournier's gangrene  - Initially started on empiric coverage with IV clindamycin and doxycycline.  - Wound cx growing GBS  - switched to multiple abx since admission.ID  recommended 2 week course of Augmentin upon discharge ( needs abx until wound fully healed) will be followed by ID as outpt  - Appreciate urology and general surgery recommendations  - Wound care provided by  Surgery .  Recommend patient may need flap coverage of the scrotal wound eventually. Urology will  arrange for outpt plastic surgery evaluation. -patient ideally would benefit from going to SNF for his complex wound but refused sayng he has a cat and 2 dogs to take care of at home. Will arrange for HHPT and RN. Social work provided him with contact information if he felt unable to take care of himself and wished to go to SNF.  uncontrolled DM  - Initially persistent hyperglycemia with fsg in 500s with AG of 15, high likely hood for poor compliance..  - Was placed on glucose stabilizer. Now improved and AG closed.  - diabetic coordinator following  - insulin dose adjusted for hypoglycemia on 1/27.  Will discharge on novolog 70/30 , 30 units bid and needs close follow up as outpatient.   Depression  -continue celexa and seroquel   Hyponatremia  -due to hyperglycemia and dehydration  -now resolved   Malnutrition   nutrition consult provided handouts. Will benefit from outpt DM nutrition follow up   Hep C diagnosed incidentally during this hospitalization. patient unaware about this an reports use of IV drug in past. Will need referral to ID or GI as outpatient.  Patient stable for discharge home with outpt follow up.  Code Status: full  Family Communication: none at bedside  Disposition Plan: Home with services. patient refused SNF  Consultants:  ID  UROLOGY  surgery Procedures:  Debridement of scrotal wound  Antibiotics:  IV clinda ( 1/19>>06/27/13)  IV doxycycline 1/191/5>>>06/30/13  IV levaquin ( 1/20>>06/30/13)  Linezolid 06/30/13>>>07/03/13  Zosyn 06/29/13>>>07/01/13  Unasyn 07/03/13>>> 1/28  Discharge Exam: Filed Vitals:   07/07/13 0458  BP: 154/90  Pulse: 59  Temp: 97.9 F (36.6 C)  Resp: 18    General: Middle aged male in NAD  HEENT: no pallor, moist mucosa  Chest: clear b/l, no added sounds  CVS: NS1&S2, no murmurs  Abd: soft, NT, ND, BS+ wound packing over scrotum, appears clean  Ext: warm, no edema  CNS: AAOX3   Discharge Instructions    Future Appointments Provider Department Dept Phone   07/20/2013 11:15 AM Thayer Headings, MD [facility] [phone]       Medication List         acetaminophen 500 MG tablet  Commonly known as:  TYLENOL  Take 1,000 mg by mouth every 6 (six) hours as needed for pain.     amoxicillin-clavulanate 875-125 MG per tablet  Commonly known as:  AUGMENTIN  Take 1 tablet by mouth every 12 (twelve) hours.UNTIL 07/20/2013     citalopram 20 MG tablet  Commonly known as:  CELEXA  Take 1 tablet (20 mg total) by mouth daily.     DSS 100 MG Caps  Take 100 mg by mouth daily as needed for moderate constipation.     fenofibrate 160 MG tablet  Take 160 mg by mouth daily.     insulin aspart protamine- aspart (70-30) 100 UNIT/ML injection  Commonly known as:  NOVOLOG MIX 70/30  Inject 0.35 mLs (35 Units total) into the skin 2 (two) times daily with a meal.     lisinopril 10 MG tablet  Commonly known as:  PRINIVIL,ZESTRIL  Take 10 mg by mouth daily.     meloxicam 15 MG tablet  Commonly known as:  MOBIC  Take 15 mg by mouth daily.     nicotine 21 mg/24hr patch  Commonly known as:  NICODERM CQ - dosed in mg/24 hours  Place 1 patch (21 mg total) onto the skin daily.     oxyCODONE-acetaminophen 5-325 MG per tablet  Commonly known as:  PERCOCET/ROXICET  Take 2 tablets by mouth every 6 (six) hours as needed for moderate pain.     SEROQUEL XR 150 MG 24 hr tablet  Generic drug:  QUEtiapine Fumarate  Take 150 mg by mouth at bedtime.       No Active Allergies     Follow-up Information   Follow up with [name], MD In 1 week.   Specialty:  Urology   Contact information:   [facility]  [place] [address] [phone]       Schedule an appointment as soon as possible for a visit with Millsaps, Luane School, NP.   Contact information:   [facility] [place]  [place] [address] [phone]       Follow up with Scharlene Gloss, MD. Schedule an appointment as soon as possible for a visit in 2 weeks.   Specialty:  Infectious Diseases   Contact information:   [address] [place] [address] [phone]        The results of significant diagnostics from this hospitalization (including imaging, microbiology, ancillary and laboratory) are listed below for reference.    Significant Diagnostic Studies: Dg Chest 2 View  06/24/2013   CLINICAL DATA:  Tachycardia, fever  EXAM: CHEST  2 VIEW  COMPARISON:  10/30/2005  FINDINGS: Lungs are clear. No pleural effusion or pneumothorax.  The heart is normal in size.  Mild degenerative changes of the visualized thoracolumbar spine.  IMPRESSION: No evidence of acute cardiopulmonary disease.   Electronically Signed   By: Julian Hy M.D.   On: 06/24/2013 22:55   US Scrotum  06/26/2013   CLINICAL DATA:  Right scrotal swelling, redness and pain.  EXAM: SCROTAL ULTRASOUND  DOPPLER ULTRASOUND OF THE TESTICLES  TECHNIQUE: Complete ultrasound examination of the testicles, epididymis, and other scrotal structures was performed. Color and spectral Doppler ultrasound were also utilized to evaluate blood flow to the testicles.  COMPARISON:  None.  FINDINGS: Right testicle  Measurements: 4 cm x 2.1 cm x 4.7 cm. No mass or microlithiasis visualized.  Left testicle  Measurements: 4.7 cm x 2.6 cm x 3.3 cm. No mass or microlithiasis visualized.  Right epididymis: Mildly enlarged with increased vascularity mostly to the tail. No mass.  Left epididymis: Normal in size in echogenicity. No increased blood flow.  Hydrocele:  None visualized.  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  Pulsed Doppler interrogation of both testes demonstrates there is right scrotal skin thickening with hyperemia surrounding the right testicle within the deep layers of the scrotum.  IMPRESSION: 1. Findings support right epididymitis as  well as edema or possibly cellulitis of the right scrotum. No hydroceles. 2. 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 3. Normal testicles.  No testicular mass or torsion   Electronically Signed   By: Lajean Manes M.D.   On: 06/26/2013 16:30   [place] Art/ven Flow Abd Pelv Doppler  06/26/2013   CLINICAL DATA:  Right scrotal swelling, redness and pain.  EXAM: SCROTAL ULTRASOUND  DOPPLER ULTRASOUND OF THE TESTICLES  TECHNIQUE: Complete ultrasound examination of the testicles, epididymis, and other scrotal structures was performed. Color and spectral Doppler ultrasound were also utilized to evaluate blood flow to the testicles.  COMPARISON:  None.  FINDINGS: Right testicle  Measurements: 4 cm x 2.1 cm x 4.7 cm. No mass or microlithiasis visualized.  Left testicle  Measurements: 4.7 cm x 2.6 cm x 3.3 cm. No mass or microlithiasis visualized.  Right epididymis: Mildly enlarged with increased vascularity mostly to the tail. No mass.  Left epididymis: Normal in size in echogenicity. No increased blood flow.  Hydrocele:  None visualized.  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  Pulsed Doppler interrogation of both testes demonstrates there is right scrotal skin thickening with hyperemia surrounding the right testicle within the deep layers of the scrotum.  IMPRESSION: 1. Findings support right epididymitis as well as edema or possibly cellulitis of the right scrotum. No hydroceles. 2. 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 3. Normal testicles.  No testicular mass or torsion   Electronically Signed   By: Lajean Manes M.D.   On: 06/26/2013 16:30    Microbiology: Recent Results (from the past 240 hour(s))  GC/CHLAMYDIA PROBE AMP     Status: None   Collection Time    06/28/13  7:17 AM      Result Value Range Status   CT Probe RNA NEGATIVE  NEGATIVE Final   GC Probe RNA NEGATIVE  NEGATIVE Final   Comment: (NOTE)                                                                                                **  Normal Reference Range: Negative**          Assay performed using the Gen-Probe APTIMA COMBO2 (R) Assay.     Acceptable specimen types for this assay include APTIMA Swabs (Unisex,     endocervical, urethral, or vaginal), first void urine, and ThinPrep     liquid based cytology samples.     Performed at [facility]     Status: None   Collection Time    06/28/13  7:06 PM      Result Value Range Status   MRSA, PCR NEGATIVE  NEGATIVE Final   Staphylococcus aureus NEGATIVE  NEGATIVE Final   Comment:            The Xpert SA Assay (FDA     approved for NASAL specimens     in patients over 14 years of age),     is one component of     a comprehensive surveillance     program.  Test performance has     been validated by [employer] for patients greater     than or equal to 6 year old.     It is not intended     to diagnose infection nor to     guide or monitor treatment.  CULTURE, ROUTINE-ABSCESS     Status: None   Collection Time    06/28/13  8:11 PM      Result Value Range Status   Specimen Description ABSCESS SCROTUM   Final   Special Requests PATIENT ON FOLLOWING LEVIQUIN DOXYCYCLINE   Final   Gram Stain     Final   Value: ABUNDANT WBC PRESENT, PREDOMINANTLY PMN     NO SQUAMOUS EPITHELIAL CELLS SEEN     MODERATE GRAM POSITIVE COCCI IN PAIRS     Performed by [facility] Gram Stain Report Called to,Read Back By and Verified With: Gram Stain Report Called to,Read Back By and Verified With:  W ALLRED RN AT 2053 ON 06/28/13 A NAVARRO     Performed at [facility]   Culture     Final   Value: MODERATE GROUP B STREP(S.AGALACTIAE)ISOLATED     Note: TESTING AGAINST S. AGALACTIAE NOT ROUTINELY PERFORMED DUE TO PREDICTABILITY OF AMP/PEN/VAN SUSCEPTIBILITY.     Performed at [facility]   Report Status 07/01/2013 FINAL   Final  ANAEROBIC CULTURE     Status: None   Collection Time    06/28/13  8:11 PM      Result Value  Range Status   Specimen Description ABSCESS SCROTUM   Final   Special Requests PATIENT ON FOLLOWING LEVIQUIN DOXYCYCLINE   Final   Gram Stain     Final   Value: NO WBC SEEN     NO SQUAMOUS EPITHELIAL CELLS SEEN     MODERATE GRAM POSITIVE COCCI IN PAIRS     Performed at [facility]   Culture     Final   Value: NO ANAEROBES ISOLATED     Performed at [facility]   Report Status 07/04/2013 FINAL   Final  GRAM STAIN     Status: None   Collection Time    06/28/13  8:11 PM      Result Value Range Status   Specimen Description ABSCESS SCROTUM   Final   Special Requests PATIENT ON FOLLOWING LEVIQUIN DOXYCYCLINE   Final   Gram Stain     Final   Value: NO  WBC SEEN     MODERATE GRAM POSITIVE COCCI IN PAIRS     Gram Stain Report Called to,Read Back By and Verified With: W ALLRED RN 2053 06/28/13 A NAVARRO   Report Status 06/28/2013 FINAL   Final     Labs: Basic Metabolic Panel:  Recent Labs Lab 07/03/13 0507 07/05/13 0425  NA 139 136*  K 4.2 3.8  CL 100 99  CO2 30 28  GLUCOSE 103* 69*  BUN 7 5*  CREATININE 0.89 0.86  CALCIUM 8.4 7.9*   Liver Function Tests: No results found for this basename: AST, ALT, ALKPHOS, BILITOT, PROT, ALBUMIN,  in the last 168 hours No results found for this basename: LIPASE, AMYLASE,  in the last 168 hours No results found for this basename: AMMONIA,  in the last 168 hours CBC:  Recent Labs Lab 07/02/13 0350 07/03/13 0507 07/04/13 0840 07/05/13 0425 07/07/13 0440  WBC 14.0* 13.5* 15.4* 15.2* 15.2*  HGB 11.8* 11.2* 11.7* 10.9* 11.3*  HCT 34.8* 33.9* 33.9* 33.1* 33.7*  MCV 90.2 90.4 90.2 90.9 90.3  PLT 365 379 404* 380 451*   Cardiac Enzymes: No results found for this basename: CKTOTAL, CKMB, CKMBINDEX, TROPONINI,  in the last 168 hours BNP: BNP (last 3 results) No results found for this basename: PROBNP,  in the last 8760 hours CBG:  Recent Labs Lab 07/06/13 1809 07/06/13 2201 07/07/13 0301 07/07/13 0720  07/07/13 0743  GLUCAP 96 120* 75 70 74       Signed:  Wilton Thrall  [facility] 07/07/2013, 11:50 AM

## 2013-07-07 NOTE — Progress Notes (Signed)
CSW met with pt today to assist with d/c planning. Pt has declined SNF placement. He will return home with [facility] services. He observed nsg provide wound care last night and wants to try to mange care at home with continued support from [place]. RNCM / MD aware. RNCM will assist with d/c planning needs .  Werner Lean LCSW

## 2013-07-07 NOTE — Progress Notes (Signed)
Physical Therapy Treatment Patient Details Name: Charles Manning MRN: [id number] DOB: Nov 03, 1955 Today's Date: 07/07/2013 Time: 2376-2831 PT Time Calculation (min): 19 min  PT Assessment / Plan / Recommendation  History of Present Illness Charles Manning is a 58 y.o. male with PMH of uncontrolled DM, chronic pain, HTN, depression, h/o melanoma resection from R groin in 2005, and chronic R leg lymphedema presents to the ER today with the above complaints.   PT Comments   Pt progressing, much better overall today  Follow Up Recommendations  Home health PT (HHOT)     Does the patient have the potential to tolerate intense rehabilitation     Barriers to Discharge        Equipment Recommendations  None recommended by PT    Recommendations for Other Services OT consult  Frequency Min 3X/week   Progress towards PT Goals Progress towards PT goals: Progressing toward goals  Plan Current plan remains appropriate    Precautions / Restrictions Precautions Precautions: Fall Restrictions Weight Bearing Restrictions: No   Pertinent Vitals/Pain Pain tolerable    Mobility  Bed Mobility Overal bed mobility: Modified Independent Transfers Overall transfer level: Needs assistance Equipment used: None Transfers: Sit to/from Stand Sit to Stand: Supervision General transfer comment: for  safety; incr time Ambulation/Gait Ambulation/Gait assistance: Supervision Ambulation Distance (Feet): 300 Feet Assistive device: None Gait Pattern/deviations: Wide base of support;Decreased stride length    Exercises     PT Diagnosis:    PT Problem List:   PT Treatment Interventions:     PT Goals (current goals can now be found in the care plan section) Acute Rehab PT Goals Patient Stated Goal: Walk without pain Time For Goal Achievement: 07/16/13 Potential to Achieve Goals: Good  Visit Information  Last PT Received On: 07/07/13 Assistance Needed: +1 History of Present Illness: Charles Manning  is a 58 y.o. male with PMH of uncontrolled DM, chronic pain, HTN, depression, h/o melanoma resection from R groin in 2005, and chronic R leg lymphedema presents to the ER today with the above complaints.    Subjective Data  Patient Stated Goal: Walk without pain   Cognition  Cognition Arousal/Alertness: Awake/alert Behavior During Therapy: WFL for tasks assessed/performed Overall Cognitive Status: Within Functional Limits for tasks assessed    Balance     End of Session PT - End of Session Activity Tolerance: Patient tolerated treatment well Patient left: in bed;with call bell/phone within reach;with nursing/sitter in room Nurse Communication: Mobility status   GP     [facility] 07/07/2013, 11:01 AM

## 2013-07-07 NOTE — Discharge Instructions (Addendum)
Diabetes Meal Planning Guide The diabetes meal planning guide is a tool to help you plan your meals and snacks. It is important for people with diabetes to manage their blood glucose (sugar) levels. Choosing the right foods and the right amounts throughout your day will help control your blood glucose. Eating right can even help you improve your blood pressure and reach or maintain a healthy weight. CARBOHYDRATE COUNTING MADE EASY When you eat carbohydrates, they turn to sugar. This raises your blood glucose level. Counting carbohydrates can help you control this level so you feel better. When you plan your meals by counting carbohydrates, you can have more flexibility in what you eat and balance your medicine with your food intake. Carbohydrate counting simply means adding up the total amount of carbohydrate grams in your meals and snacks. Try to eat about the same amount at each meal. Foods with carbohydrates are listed below. Each portion below is 1 carbohydrate serving or 15 grams of carbohydrates. Ask your dietician how many grams of carbohydrates you should eat at each meal or snack. Grains and Starches  1 slice bread.   English muffin or hotdog/hamburger bun.   cup cold cereal (unsweetened).   cup cooked pasta or rice.   cup starchy vegetables (corn, potatoes, peas, beans, winter squash).  1 tortilla (6 inches).   bagel.  1 waffle or pancake (size of a CD).   cup cooked cereal.  4 to 6 small crackers. *Whole grain is recommended. Fruit  1 cup fresh unsweetened berries, melon, papaya, pineapple.  1 small fresh fruit.   banana or mango.   cup fruit juice (4 oz unsweetened).   cup canned fruit in natural juice or water.  2 tbs dried fruit.  12 to 15 grapes or cherries. Milk and Yogurt  1 cup fat-free or 1% milk.  1 cup soy milk.  6 oz light yogurt with sugar-free sweetener.  6 oz low-fat soy yogurt.  6 oz plain yogurt. Vegetables  1 cup raw or  cup  cooked is counted as 0 carbohydrates or a "free" food.  If you eat 3 or more servings at 1 meal, count them as 1 carbohydrate serving. Other Carbohydrates   oz chips or pretzels.   cup ice cream or frozen yogurt.   cup sherbet or sorbet.  2 inch square cake, no frosting.  1 tbs honey, sugar, jam, jelly, or syrup.  2 small cookies.  3 squares of graham crackers.  3 cups popcorn.  6 crackers.  1 cup broth-based soup.  Count 1 cup casserole or other mixed foods as 2 carbohydrate servings.  Foods with less than 20 calories in a serving may be counted as 0 carbohydrates or a "free" food. You may want to purchase a book or computer software that lists the carbohydrate gram counts of different foods. In addition, the nutrition facts panel on the labels of the foods you eat are a good source of this information. The label will tell you how big the serving size is and the total number of carbohydrate grams you will be eating per serving. Divide this number by 15 to obtain the number of carbohydrate servings in a portion. Remember, 1 carbohydrate serving equals 15 grams of carbohydrate. SERVING SIZES Measuring foods and serving sizes helps you make sure you are getting the right amount of food. The list below tells how big or small some common serving sizes are.  1 oz.........4 stacked dice.  3 oz........Marland KitchenDeck of cards.  1 tsp.......Marland KitchenTip  of little finger.  1 tbs......Marland KitchenMarland KitchenThumb.  2 tbs.......Marland KitchenGolf ball.   cup......Marland KitchenHalf of a fist.  1 cup.......Marland KitchenA fist. SAMPLE DIABETES MEAL PLAN Below is a sample meal plan that includes foods from the grain and starches, dairy, vegetable, fruit, and meat groups. A dietician can individualize a meal plan to fit your calorie needs and tell you the number of servings needed from each food group. However, controlling the total amount of carbohydrates in your meal or snack is more important than making sure you include all of the food groups at every  meal. You may interchange carbohydrate containing foods (dairy, starches, and fruits). The meal plan below is an example of a 2000 calorie diet using carbohydrate counting. This meal plan has 17 carbohydrate servings. Breakfast  1 cup oatmeal (2 carb servings).   cup light yogurt (1 carb serving).  1 cup blueberries (1 carb serving).   cup almonds. Snack  1 large apple (2 carb servings).  1 low-fat string cheese stick. Lunch  Chicken breast salad.  1 cup spinach.   cup chopped tomatoes.  2 oz chicken breast, sliced.  2 tbs low-fat [place] dressing.  12 whole-wheat crackers (2 carb servings).  12 to 15 grapes (1 carb serving).  1 cup low-fat milk (1 carb serving). Snack  1 cup carrots.   cup hummus (1 carb serving). Dinner  3 oz broiled salmon.  1 cup brown rice (3 carb servings). Snack  1  cups steamed broccoli (1 carb serving) drizzled with 1 tsp olive oil and lemon juice.  1 cup light pudding (2 carb servings). DIABETES MEAL PLANNING WORKSHEET Your dietician can use this worksheet to help you decide how many servings of foods and what types of foods are right for you.  BREAKFAST Food Group and Servings / Carb Servings Grain/Starches __________________________________ Dairy __________________________________________ Vegetable ______________________________________ Fruit ___________________________________________ Meat __________________________________________ Fat ____________________________________________ LUNCH Food Group and Servings / Carb Servings Grain/Starches ___________________________________ Dairy ___________________________________________ Fruit ____________________________________________ Meat ___________________________________________ Fat _____________________________________________ Wonda Cheng Food Group and Servings / Carb Servings Grain/Starches ___________________________________ Dairy  ___________________________________________ Fruit ____________________________________________ Meat ___________________________________________ Fat _____________________________________________ SNACKS Food Group and Servings / Carb Servings Grain/Starches ___________________________________ Dairy ___________________________________________ Vegetable _______________________________________ Fruit ____________________________________________ Meat ___________________________________________ Fat _____________________________________________ DAILY TOTALS Starches _________________________ Vegetable ________________________ Fruit ____________________________ Dairy ____________________________ Meat ____________________________ Fat ______________________________ Document Released: 02/19/2005 Document Revised: 08/17/2011 Document Reviewed: 12/31/2008 [employer] Patient Information 2014 [employer].   Blood Glucose Monitoring, Adult Monitoring your blood glucose (also know as blood sugar) helps you to manage your diabetes. It also helps you and your health care provider monitor your diabetes and determine how well your treatment plan is working. WHY SHOULD YOU MONITOR YOUR BLOOD GLUCOSE?  It can help you understand how food, exercise, and medicine affect your blood glucose.  It allows you to know what your blood glucose is at any given moment. You can quickly tell if you are having low blood glucose (hypoglycemia) or high blood glucose (hyperglycemia).  It can help you and your health care provider know how to adjust your medicines.  It can help you understand how to manage an illness or adjust medicine for exercise. WHEN SHOULD YOU TEST? Your health care provider will help you decide how often you should check your blood glucose. This may depend on the type of diabetes you have, your diabetes control, or the types of medicines you are taking. Be sure to write down all of your blood glucose  readings so that this information can be reviewed with your health care provider.  See below for examples of testing times that your health care provider may suggest. Type 1 Diabetes  Test 4 times a day if you are in good control, using an insulin pump, or perform multiple daily injections.  If your diabetes is not well-controlled or if you are sick, you may need to monitor more often.  It is a good idea to also monitor:  Before and after exercise.  Between meals and 2 hours after a meal.  Occasionally between 2:00 to 3:00 am. Type 2 Diabetes  It can vary with each person, but generally, if you are on insulin, test 4 times a day.  If you take medicines by mouth (orally), test 2 times a day.  If you are on a controlled diet, test once a day.  If your diabetes is not well controlled or if you are sick, you may need to monitor more often. HOW TO MONITOR YOUR BLOOD GLUCOSE Supplies Needed  Blood glucose meter.  Test strips for your meter. Each meter has its own strips. You must use the strips that go with your own meter.  A pricking needle (lancet).  A device that holds the lancet (lancing device).  A journal or log book to write down your results. Procedure  Wash your hands with soap and water. Alcohol is not preferred.  Prick the side of your finger (not the tip) with the lancet.  Gently milk the finger until a small drop of blood appears.  Follow the instructions that come with your meter for inserting the test strip, applying blood to the strip, and using your blood glucose meter. Other Areas to Get Blood for Testing Some meters allow you to use other areas of your body (other than your finger) to test your blood. These areas are called alternative sites. The most common alternative sites are:  The forearm.  The thigh.  The back area of the lower leg.  The palm of the hand. The blood flow in these areas is slower. Therefore, the blood glucose values you get may be  delayed, and the numbers are different from what you would get from your fingers. Do not use alternative sites if you think you are having hypoglycemia. Your reading will not be accurate. Always use a finger if you are having hypoglycemia. Also, if you cannot feel your lows (hypoglycemia unawareness), always use your fingers for your blood glucose checks. ADDITIONAL TIPS FOR GLUCOSE MONITORING  Do not reuse lancets.  Always carry your supplies with you.  All blood glucose meters have a 24-hour "hotline" number to call if you have questions or need help.  Adjust (calibrate) your blood glucose meter with a control solution after finishing a few boxes of strips. BLOOD GLUCOSE RECORD KEEPING It is a good idea to keep a daily record or log of your blood glucose readings. Most glucose meters, if not all, keep your glucose records stored in the meter. Some meters come with the ability to download your records to your home computer. Keeping a record of your blood glucose readings is especially helpful if you are wanting to look for patterns. Make notes to go along with the blood glucose readings because you might forget what happened at that exact time. Keeping good records helps you and your health care provider to work together to achieve good diabetes management.  Document Released: 05/28/2003 Document Revised: 01/25/2013 Document Reviewed: 10/17/2012 [facility] Patient Information 2014 [place].

## 2013-07-07 NOTE — Progress Notes (Signed)
RN completed dressing change with patient. Patient completed his dressing change, and could repeat the process back to me after he completed his dressing change with RN.  RN completed all other discharge instructions with patient. Discharge instructions and prescriptions given to patient.  RN also gave patient enough dressing supplies for dressing changes today and tomorrow.

## 2013-07-07 NOTE — Progress Notes (Signed)
Patient ID: Charles Manning, male   DOB: 01/02/1956, 58 y.o.   MRN: [id number]  Foley out. Pt voiding without difficulty.  Urine clear in the urinal.  Filed Vitals:   07/07/13 0458  BP: 154/90  Pulse: 59  Temp: 97.9 F (36.6 C)  Resp: 18    PE: Scrotal wound is granulating well now in the middle of the wound over the testicles and more superiorly in the cavity underneath the skin there is now red granulation tissue.  There is an area of the superior left skin edge that has a few millimeters of necrosis and a similar area right inferior.  I suspect these areas will slough off or could be simply debrided in the office.  The patient had no pain.  The wound is contracting and filling in nicely.    Imp/plan - cSSSI - scrotal wound stable and beginning to granulate nicely.  I told him he could shower prior to dressing changes.  I would remove the old dressing, shower and place a new one.  He knows not to spray the wounds directly or scrub the wound. I'll see him back in a couple of weeks to remove the nylon sutures from the inferior scrotum used for reconstruction.

## 2013-07-20 ENCOUNTER — Encounter: Payer: Self-pay | Admitting: Internal Medicine

## 2013-07-20 ENCOUNTER — Ambulatory Visit (INDEPENDENT_AMBULATORY_CARE_PROVIDER_SITE_OTHER): Payer: Medicare Other | Admitting: Internal Medicine

## 2013-07-20 VITALS — BP 137/78 | HR 78 | Temp 97.7°F | Ht 70.0 in | Wt 193.0 lb

## 2013-07-20 DIAGNOSIS — L0390 Cellulitis, unspecified: Secondary | ICD-10-CM

## 2013-07-20 DIAGNOSIS — B1920 Unspecified viral hepatitis C without hepatic coma: Secondary | ICD-10-CM

## 2013-07-20 DIAGNOSIS — R894 Abnormal immunological findings in specimens from other organs, systems and tissues: Secondary | ICD-10-CM

## 2013-07-20 DIAGNOSIS — R768 Other specified abnormal immunological findings in serum: Secondary | ICD-10-CM

## 2013-07-20 DIAGNOSIS — L0291 Cutaneous abscess, unspecified: Secondary | ICD-10-CM

## 2013-07-20 LAB — HEPATITIS C ANTIBODY: HCV AB: REACTIVE — AB

## 2013-07-20 NOTE — Assessment & Plan Note (Signed)
Now has completed treatment.  Further care per urology.

## 2013-07-20 NOTE — Progress Notes (Signed)
   Subjective:    Patient ID: Charles Manning, male    DOB: 01/28/1956, 58 y.o.   MRN: [id number]  HPI Here for follow up of his scrotal infection.  I saw him in the hospital and required debridement by Dr. Junious Silk.  No improving.  Did grow GBS in wound.  Sent out on Augmentin and finished yesterday.  Healing well and has seen urology this week.  No fever, no chills.    Also noted to have hepatitis C in the [place].  Was done with an employee needle stick injury.  No results available in Epic.  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  He does drink occasional alcohol.  He is working on his diabetes and reducing cigarettes.  No history of liver disease.  Is interested in treatment options, if available.  No abdominal issues.  Has a pcp at [place].  On disability.     Review of Systems  Constitutional: Negative for fever, fatigue and unexpected weight change.  Gastrointestinal: Negative for nausea, abdominal pain, diarrhea, blood in stool and abdominal distention.  Endocrine: Negative for polyuria.  Genitourinary: Positive for scrotal swelling (improved).  Musculoskeletal: Negative for joint swelling.  Skin: Negative for rash.  Neurological: Negative for dizziness and light-headedness.  Hematological: Negative for adenopathy.       Objective:   Physical Exam  Constitutional: He appears well-developed and well-nourished. No distress.  HENT:  Mouth/Throat: No oropharyngeal exudate.  Eyes: Right eye exhibits no discharge. Left eye exhibits no discharge. No scleral icterus.  Cardiovascular: Normal rate, regular rhythm and normal heart sounds.   No murmur heard. Pulmonary/Chest: Effort normal and breath sounds normal. No respiratory distress. He has no wheezes.  Abdominal: Soft. Bowel sounds are normal. He exhibits no distension (no HSM) and no mass. There is no tenderness. There is no rebound and no guarding.    Genitourinary:  Improved   Musculoskeletal: He exhibits no edema.  Lymphadenopathy:    He has no cervical adenopathy.  Skin: No rash noted.  No PCT, no spider angioma  Psychiatric: He has a normal mood and affect.          Assessment & Plan:

## 2013-07-20 NOTE — Assessment & Plan Note (Signed)
New diagnosis.  Likely from IV drugs in his past.  Will check viral RNA and if positive will initiate further evaluation and consideration of treatment.  Discussed protection of his liver, lstoppping alcohol, limiting tylenol to 2 grams daily.  Discussed transmission, sexual transmission, toothbrushes, razors.  RTC 1-2 weeks.  If positive will do more labs, [place], will need substance abuse counseling for drugs and alcohol.

## 2013-07-21 LAB — HEPATITIS C RNA QUANTITATIVE
HCV Quantitative Log: 6.66 {Log} — ABNORMAL HIGH (ref <1.18)
HCV Quantitative: 4598416 IU/mL — ABNORMAL HIGH (ref <15)

## 2013-07-24 ENCOUNTER — Other Ambulatory Visit: Payer: Self-pay | Admitting: Internal Medicine

## 2013-07-24 DIAGNOSIS — B1920 Unspecified viral hepatitis C without hepatic coma: Secondary | ICD-10-CM

## 2013-07-26 NOTE — Progress Notes (Signed)
I have left patient 2 voice mails to call the clinic regarding scheduling these tests.  Charles Manning

## 2013-08-01 ENCOUNTER — Encounter: Payer: Self-pay | Admitting: Internal Medicine

## 2013-08-01 ENCOUNTER — Ambulatory Visit (INDEPENDENT_AMBULATORY_CARE_PROVIDER_SITE_OTHER): Payer: Medicare Other | Admitting: Internal Medicine

## 2013-08-01 VITALS — BP 164/92 | HR 76 | Temp 97.9°F | Ht 70.0 in | Wt 195.5 lb

## 2013-08-01 DIAGNOSIS — F1910 Other psychoactive substance abuse, uncomplicated: Secondary | ICD-10-CM

## 2013-08-01 DIAGNOSIS — F1990 Other psychoactive substance use, unspecified, uncomplicated: Secondary | ICD-10-CM

## 2013-08-01 DIAGNOSIS — B1920 Unspecified viral hepatitis C without hepatic coma: Secondary | ICD-10-CM

## 2013-08-01 LAB — IRON: Iron: 121 ug/dL (ref 42–165)

## 2013-08-01 LAB — CBC WITH DIFFERENTIAL/PLATELET
BASOS PCT: 1 % (ref 0–1)
Basophils Absolute: 0.1 10*3/uL (ref 0.0–0.1)
Eosinophils Absolute: 0.6 10*3/uL (ref 0.0–0.7)
Eosinophils Relative: 4 % (ref 0–5)
HCT: 40.9 % (ref 39.0–52.0)
HEMOGLOBIN: 14.2 g/dL (ref 13.0–17.0)
Lymphocytes Relative: 35 % (ref 12–46)
Lymphs Abs: 4.9 10*3/uL — ABNORMAL HIGH (ref 0.7–4.0)
MCH: 31.4 pg (ref 26.0–34.0)
MCHC: 34.7 g/dL (ref 30.0–36.0)
MCV: 90.5 fL (ref 78.0–100.0)
MONO ABS: 1.3 10*3/uL — AB (ref 0.1–1.0)
MONOS PCT: 9 % (ref 3–12)
NEUTROS PCT: 51 % (ref 43–77)
Neutro Abs: 7.1 10*3/uL (ref 1.7–7.7)
Platelets: 395 10*3/uL (ref 150–400)
RBC: 4.52 MIL/uL (ref 4.22–5.81)
RDW: 14.5 % (ref 11.5–15.5)
WBC: 13.9 10*3/uL — AB (ref 4.0–10.5)

## 2013-08-01 LAB — COMPLETE METABOLIC PANEL WITH GFR
ALT: 32 U/L (ref 0–53)
AST: 34 U/L (ref 0–37)
Albumin: 4.3 g/dL (ref 3.5–5.2)
Alkaline Phosphatase: 54 U/L (ref 39–117)
BILIRUBIN TOTAL: 0.5 mg/dL (ref 0.2–1.2)
BUN: 50 mg/dL — ABNORMAL HIGH (ref 6–23)
CO2: 22 mEq/L (ref 19–32)
Calcium: 10.1 mg/dL (ref 8.4–10.5)
Chloride: 102 mEq/L (ref 96–112)
Creat: 1.52 mg/dL — ABNORMAL HIGH (ref 0.50–1.35)
GFR, EST NON AFRICAN AMERICAN: 51 mL/min — AB
GFR, Est African American: 59 mL/min — ABNORMAL LOW
GLUCOSE: 239 mg/dL — AB (ref 70–99)
Potassium: 5.2 mEq/L (ref 3.5–5.3)
SODIUM: 132 meq/L — AB (ref 135–145)
TOTAL PROTEIN: 7.1 g/dL (ref 6.0–8.3)

## 2013-08-01 LAB — HEPATITIS B SURFACE ANTIGEN: HEP B S AG: NEGATIVE

## 2013-08-01 LAB — PROTIME-INR
INR: 1 (ref <1.50)
Prothrombin Time: 13.1 seconds (ref 11.6–15.2)

## 2013-08-01 LAB — HEPATITIS A ANTIBODY, TOTAL: HEP A TOTAL AB: NONREACTIVE

## 2013-08-01 LAB — HEPATITIS B CORE ANTIBODY, TOTAL: HEP B C TOTAL AB: NONREACTIVE

## 2013-08-01 NOTE — Progress Notes (Signed)
   Subjective:    Patient ID: Charles Manning, male    DOB: 02-Mar-1956, 58 y.o.   MRN: [id number]  HPI Here for follow up of HCV.  Diagnosed in hospital after needle stick accident.  I saw him in follow up and confirmed active HCV chronic infection.  No new issues.  Is due for plastic surgery for his scrotal infection next week.     Review of Systems  Constitutional: Negative for fever and fatigue.  Musculoskeletal: Negative for joint swelling.  Skin: Negative for rash.  Neurological: Negative for dizziness.       Objective:   Physical Exam  Constitutional: He appears well-developed and well-nourished. No distress.  HENT:  Mouth/Throat: No oropharyngeal exudate.  Eyes: No scleral icterus.  Cardiovascular: Normal rate, regular rhythm and normal heart sounds.   No murmur heard. Pulmonary/Chest: Effort normal and breath sounds normal. No respiratory distress.  Lymphadenopathy:    He has no cervical adenopathy.  Skin: No rash noted.          Assessment & Plan:

## 2013-08-01 NOTE — Assessment & Plan Note (Signed)
Confirmed so will check other lab studies, genotype, etc.. Along with elastography.  Will then prescribe appropriate therapy, if appropriate.    RTC in April unless starting meds sooner.

## 2013-08-02 LAB — HIV-1 RNA QUANT-NO REFLEX-BLD: HIV-1 RNA Quant, Log: <1.3 {Log} (ref <1.30)

## 2013-08-02 LAB — ANA: Anti Nuclear Antibody(ANA): NEGATIVE

## 2013-08-04 ENCOUNTER — Ambulatory Visit (HOSPITAL_COMMUNITY)
Admission: RE | Admit: 2013-08-04 | Discharge: 2013-08-04 | Disposition: A | Discharge status: Home / Self Care | Payer: Medicare Other | Source: Ambulatory Visit | Attending: Internal Medicine | Admitting: Internal Medicine

## 2013-08-04 DIAGNOSIS — C439 Malignant melanoma of skin, unspecified: Secondary | ICD-10-CM | POA: Insufficient documentation

## 2013-08-04 DIAGNOSIS — F17200 Nicotine dependence, unspecified, uncomplicated: Secondary | ICD-10-CM | POA: Insufficient documentation

## 2013-08-04 DIAGNOSIS — B1920 Unspecified viral hepatitis C without hepatic coma: Secondary | ICD-10-CM | POA: Insufficient documentation

## 2013-08-04 DIAGNOSIS — E119 Type 2 diabetes mellitus without complications: Secondary | ICD-10-CM | POA: Insufficient documentation

## 2013-08-04 DIAGNOSIS — I10 Essential (primary) hypertension: Secondary | ICD-10-CM | POA: Insufficient documentation

## 2013-08-04 LAB — HEPATITIS C GENOTYPE

## 2013-08-07 ENCOUNTER — Other Ambulatory Visit: Payer: Self-pay | Admitting: Internal Medicine

## 2013-08-07 MED ORDER — LEDIPASVIR-SOFOSBUVIR 90-400 MG PO TABS: 1.0000 | ORAL_TABLET | Freq: Every day | ORAL | Status: DC

## 2013-08-10 ENCOUNTER — Encounter (HOSPITAL_BASED_OUTPATIENT_CLINIC_OR_DEPARTMENT_OTHER): Payer: Self-pay | Admitting: *Deleted

## 2013-08-10 NOTE — Progress Notes (Signed)
08/10/13 1137  OBSTRUCTIVE SLEEP APNEA  Have you ever been diagnosed with sleep apnea through a sleep study? No  Do you snore loudly (loud enough to be heard through closed doors)?  1  Do you often feel tired, fatigued, or sleepy during the daytime? 0  Has anyone observed you stop breathing during your sleep? 0  Do you have, or are you being treated for high blood pressure? 1  BMI more than 35 kg/m2? 0  Age over 56 years old? 1  Neck circumference greater than 40 cm/18 inches? 0  Gender: 1  Obstructive Sleep Apnea Score 4  Score 4 or greater  Results sent to PCP

## 2013-08-10 NOTE — Progress Notes (Signed)
Was in hospital for wound care-surgery Cannot find an ekg-may need one

## 2013-08-11 ENCOUNTER — Other Ambulatory Visit: Payer: Self-pay | Admitting: Plastic Surgery

## 2013-08-11 DIAGNOSIS — L98499 Non-pressure chronic ulcer of skin of other sites with unspecified severity: Secondary | ICD-10-CM

## 2013-08-11 DIAGNOSIS — N5089 Other specified disorders of the male genital organs: Secondary | ICD-10-CM

## 2013-08-11 NOTE — H&P (Signed)
Charles Manning is an 58 y.o. male.   Chief Complaint: scrotal and groin ulcer HPI: The patient is a 58 yrs old wm here for a history and physical for treatment of a right scrotal ulcer. He states that he scratched the area over a month ago and then noticed swelling, tenderness and an ulcer. He was seen by a PCP and sent to the ED. He was admitted and underwent multiple debridements. Today he has some fibrous tissue around the edges. He also has an incision in the right groin. He is a smoker and has diabetes as well. The area is slightly red but does not look actively infected.     Past Medical History  Diagnosis Date  . HTN (hypertension) 09/25/2011  . DM (diabetes mellitus) 09/25/2011  . Degenerative joint disease 09/25/2011  . Depression 09/25/2011  . Anxiety 09/25/2011  . Chronic pain syndrome 09/25/2011  . Basal cell cancer 09/25/2011    Mid back 2007  . Melanoma 09/25/2011    Right thigh 2005  . Wears glasses     Past Surgical History  Procedure Laterality Date  . Tumor removal from right thigh    . Lymph node removal    . Incision and drainage abscess N/A 06/28/2013    Procedure: INCISION AND DRAINAGE SCROTAL ABSCESS;  Surgeon: Reece Packer, MD;  Location: [facility];  Service: Urology;  Laterality: N/A;  . Scrotal exploration N/A 06/30/2013    Procedure: SCROTUM DEBRIDEMENT;  Surgeon: [name], MD;  Location: [facility];  Service: Urology;  Laterality: N/A;    Family History  Problem Relation Age of Onset  . Aneurysm Mother   . Cancer Father   . Liver disease Neg Hx    Social History:  reports that he has been smoking Cigarettes.  He has been smoking about 0.50 packs per day. He has never used smokeless tobacco. He reports that he drinks alcohol. He reports that he does not use illicit drugs.  Allergies:  Allergies  Allergen Reactions  . Vancomycin Itching    Able to tolerate with diphenhydramine premedication and increased vancomycin infusion time     (Not in  a hospital admission)  No results found for this or any previous visit (from the past 48 hour(s)). No results found.  Review of Systems  Constitutional: Negative.   HENT: Negative.   Eyes: Negative.   Respiratory: Negative.   Cardiovascular: Negative.   Gastrointestinal: Negative.   Genitourinary: Negative.   Musculoskeletal: Negative.   Skin: Negative.   Neurological: Negative.   Psychiatric/Behavioral: Negative.     There were no vitals taken for this visit. Physical Exam  Constitutional: He appears well-developed and well-nourished.  HENT:  Head: Normocephalic and atraumatic.  Eyes: Conjunctivae and EOM are normal. Pupils are equal, round, and reactive to light.  Cardiovascular: Normal rate.   Respiratory: Effort normal.  Genitourinary:     Musculoskeletal: Normal range of motion.  Neurological: He is alert.  Skin: Skin is warm.  Psychiatric: He has a normal mood and affect. His behavior is normal. Judgment and thought content normal.     Assessment/Plan Plan for irrigation, debridement and possible Acell versus skin graft to groin and scrotum and possible primary closure.  [place] 08/11/2013, 1:24 PM

## 2013-08-16 ENCOUNTER — Encounter (HOSPITAL_BASED_OUTPATIENT_CLINIC_OR_DEPARTMENT_OTHER): Payer: Medicare Other | Admitting: Anesthesiology

## 2013-08-16 ENCOUNTER — Ambulatory Visit (HOSPITAL_BASED_OUTPATIENT_CLINIC_OR_DEPARTMENT_OTHER): Payer: Medicare Other | Admitting: Anesthesiology

## 2013-08-16 ENCOUNTER — Encounter (HOSPITAL_BASED_OUTPATIENT_CLINIC_OR_DEPARTMENT_OTHER): Payer: Self-pay | Admitting: *Deleted

## 2013-08-16 ENCOUNTER — Ambulatory Visit (HOSPITAL_BASED_OUTPATIENT_CLINIC_OR_DEPARTMENT_OTHER)
Admission: RE | Admit: 2013-08-16 | Discharge: 2013-08-16 | Disposition: A | Discharge status: Home / Self Care | Payer: Medicare Other | Source: Ambulatory Visit | Attending: Plastic Surgery | Admitting: Plastic Surgery

## 2013-08-16 ENCOUNTER — Encounter (HOSPITAL_BASED_OUTPATIENT_CLINIC_OR_DEPARTMENT_OTHER)
Admission: RE | Disposition: A | Discharge status: Home / Self Care | Payer: Self-pay | Source: Ambulatory Visit | Attending: Plastic Surgery

## 2013-08-16 DIAGNOSIS — F329 Major depressive disorder, single episode, unspecified: Secondary | ICD-10-CM | POA: Insufficient documentation

## 2013-08-16 DIAGNOSIS — N508 Other specified disorders of male genital organs: Secondary | ICD-10-CM | POA: Diagnosis not present

## 2013-08-16 DIAGNOSIS — E119 Type 2 diabetes mellitus without complications: Secondary | ICD-10-CM | POA: Diagnosis not present

## 2013-08-16 DIAGNOSIS — N5089 Other specified disorders of the male genital organs: Secondary | ICD-10-CM

## 2013-08-16 DIAGNOSIS — F17200 Nicotine dependence, unspecified, uncomplicated: Secondary | ICD-10-CM | POA: Diagnosis not present

## 2013-08-16 DIAGNOSIS — F3289 Other specified depressive episodes: Secondary | ICD-10-CM | POA: Diagnosis not present

## 2013-08-16 DIAGNOSIS — G894 Chronic pain syndrome: Secondary | ICD-10-CM | POA: Diagnosis not present

## 2013-08-16 DIAGNOSIS — F411 Generalized anxiety disorder: Secondary | ICD-10-CM | POA: Insufficient documentation

## 2013-08-16 DIAGNOSIS — I10 Essential (primary) hypertension: Secondary | ICD-10-CM | POA: Insufficient documentation

## 2013-08-16 DIAGNOSIS — L98499 Non-pressure chronic ulcer of skin of other sites with unspecified severity: Secondary | ICD-10-CM | POA: Diagnosis not present

## 2013-08-16 HISTORY — PX: MINOR IRRIGATION AND DEBRIDEMENT OF WOUND: SHX6239

## 2013-08-16 HISTORY — DX: Presence of spectacles and contact lenses: Z97.3

## 2013-08-16 LAB — POCT I-STAT, CHEM 8
BUN: 40 mg/dL — ABNORMAL HIGH (ref 6–23)
BUN: 39 mg/dL — ABNORMAL HIGH (ref 6–23)
CALCIUM ION: 1.3 mmol/L — AB (ref 1.12–1.23)
Calcium, Ion: 1.22 mmol/L (ref 1.12–1.23)
Chloride: 103 mEq/L (ref 96–112)
Chloride: 101 mEq/L (ref 96–112)
Creatinine, Ser: 0.9 mg/dL (ref 0.50–1.35)
Creatinine, Ser: 1 mg/dL (ref 0.50–1.35)
GLUCOSE: 517 mg/dL — AB (ref 70–99)
GLUCOSE: 499 mg/dL — AB (ref 70–99)
HCT: 52 % (ref 39.0–52.0)
HEMATOCRIT: 50 % (ref 39.0–52.0)
HEMOGLOBIN: 17.7 g/dL — AB (ref 13.0–17.0)
HEMOGLOBIN: 17 g/dL (ref 13.0–17.0)
POTASSIUM: 4.9 meq/L (ref 3.7–5.3)
Potassium: 4.8 mEq/L (ref 3.7–5.3)
SODIUM: 135 meq/L — AB (ref 137–147)
Sodium: 136 mEq/L — ABNORMAL LOW (ref 137–147)
TCO2: 20 mmol/L (ref 0–100)
TCO2: 21 mmol/L (ref 0–100)

## 2013-08-16 SURGERY — MINOR IRRIGATION AND DEBRIDEMENT OF WOUND
Anesthesia: LOCAL | Site: Groin | Laterality: Right

## 2013-08-16 MED ORDER — CEFAZOLIN SODIUM-DEXTROSE 2-3 GM-% IV SOLR
INTRAVENOUS | Status: AC
  Filled 2013-08-16: qty 50

## 2013-08-16 MED ORDER — LACTATED RINGERS IV SOLN
INTRAVENOUS | Status: DC
  Administered 2013-08-16: 10:00:00 via INTRAVENOUS

## 2013-08-16 MED ORDER — INSULIN REGULAR HUMAN 100 UNIT/ML IJ SOLN
INTRAMUSCULAR | Status: AC
  Filled 2013-08-16: qty 1

## 2013-08-16 MED ORDER — INSULIN REGULAR HUMAN 100 UNIT/ML IJ SOLN
3.0000 [IU] | Freq: Once | INTRAMUSCULAR | Status: AC
  Administered 2013-08-16: 3 [IU] via INTRAVENOUS

## 2013-08-16 MED ORDER — FENTANYL CITRATE 0.05 MG/ML IJ SOLN
INTRAMUSCULAR | Status: AC
  Filled 2013-08-16: qty 6

## 2013-08-16 MED ORDER — INSULIN REGULAR HUMAN 100 UNIT/ML IJ SOLN: 3.0000 [IU] | Freq: Once | INTRAMUSCULAR | Status: DC

## 2013-08-16 MED ORDER — POLYMYXIN B SULFATE 500000 UNITS IJ SOLR
INTRAMUSCULAR | Status: DC | PRN
  Administered 2013-08-16: 11:00:00

## 2013-08-16 MED ORDER — MIDAZOLAM HCL 2 MG/2ML IJ SOLN: 1.0000 mg | INTRAMUSCULAR | Status: DC | PRN

## 2013-08-16 MED ORDER — LIDOCAINE-EPINEPHRINE 1 %-1:100000 IJ SOLN
INTRAMUSCULAR | Status: DC | PRN
  Administered 2013-08-16: 10 mL

## 2013-08-16 MED ORDER — MIDAZOLAM HCL 2 MG/2ML IJ SOLN
INTRAMUSCULAR | Status: AC
  Filled 2013-08-16: qty 2

## 2013-08-16 MED ORDER — FENTANYL CITRATE 0.05 MG/ML IJ SOLN: 50.0000 ug | INTRAMUSCULAR | Status: DC | PRN

## 2013-08-16 MED ORDER — HYDROCODONE-ACETAMINOPHEN 5-325 MG PO TABS: 1.0000 | ORAL_TABLET | Freq: Four times a day (QID) | ORAL | Status: DC | PRN

## 2013-08-16 MED ORDER — CEFAZOLIN SODIUM-DEXTROSE 2-3 GM-% IV SOLR: 2.0000 g | INTRAVENOUS | Status: DC

## 2013-08-16 MED ORDER — INSULIN REGULAR HUMAN 100 UNIT/ML IJ SOLN
10.0000 [IU] | Freq: Once | INTRAMUSCULAR | Status: AC
Start: 2013-08-16 — End: 2013-08-16
  Administered 2013-08-16: 10 [IU] via SUBCUTANEOUS

## 2013-08-16 SURGICAL SUPPLY — 58 items
BLADE HEX COATED 2.75 (ELECTRODE) ×1 IMPLANT
BLADE SURG 10 STRL SS (BLADE) IMPLANT
BLADE SURG 15 STRL LF DISP TIS (BLADE) ×2 IMPLANT
BLADE SURG 15 STRL SS (BLADE) ×3
BLADE SURG ROTATE 9660 (MISCELLANEOUS) IMPLANT
BNDG GAUZE ELAST 4 BULKY (GAUZE/BANDAGES/DRESSINGS) IMPLANT
BRIEF STRETCH FOR OB PAD LRG (UNDERPADS AND DIAPERS) ×3 IMPLANT
BURN MATRIX MATRISTEM 7X10 (Orthopedic Implant) ×2 IMPLANT
COVER MAYO STAND STRL (DRAPES) ×3 IMPLANT
COVER TABLE BACK 60X90 (DRAPES) ×3 IMPLANT
DECANTER SPIKE VIAL GLASS SM (MISCELLANEOUS) ×2 IMPLANT
DRAIN CHANNEL 19F RND (DRAIN) IMPLANT
DRAPE INCISE IOBAN 66X45 STRL (DRAPES) IMPLANT
DRAPE LAPAROTOMY TRNSV 102X78 (DRAPE) ×2 IMPLANT
DRAPE PED LAPAROTOMY (DRAPES) IMPLANT
DRAPE SURG 17X23 STRL (DRAPES) IMPLANT
DRAPE U-SHAPE 76X120 STRL (DRAPES) IMPLANT
DRSG ADAPTIC 3X8 NADH LF (GAUZE/BANDAGES/DRESSINGS) IMPLANT
DRSG EMULSION OIL 3X3 NADH (GAUZE/BANDAGES/DRESSINGS) IMPLANT
DRSG OPSITE 6X11 MED (GAUZE/BANDAGES/DRESSINGS) IMPLANT
DRSG TEGADERM 4X10 (GAUZE/BANDAGES/DRESSINGS) IMPLANT
DRSG TELFA 3X8 NADH (GAUZE/BANDAGES/DRESSINGS) IMPLANT
ELECT REM PT RETURN 9FT ADLT (ELECTROSURGICAL)
ELECTRODE REM PT RTRN 9FT ADLT (ELECTROSURGICAL) ×1 IMPLANT
EVACUATOR SILICONE 100CC (DRAIN) IMPLANT
GAUZE XEROFORM 5X9 LF (GAUZE/BANDAGES/DRESSINGS) IMPLANT
GLOVE BIO SURGEON STRL SZ 6.5 (GLOVE) ×10 IMPLANT
GOWN STRL REUS W/ TWL LRG LVL3 (GOWN DISPOSABLE) ×4 IMPLANT
GOWN STRL REUS W/TWL LRG LVL3 (GOWN DISPOSABLE) ×6
HANDPIECE INTERPULSE COAX TIP (DISPOSABLE)
MANIFOLD NEPTUNE II (INSTRUMENTS) IMPLANT
MICROMATRIX 500MG (Tissue) ×3 IMPLANT
NDL HYPO 25X1 1.5 SAFETY (NEEDLE) ×1 IMPLANT
NEEDLE HYPO 25X1 1.5 SAFETY (NEEDLE) ×3 IMPLANT
NS IRRIG 1000ML POUR BTL (IV SOLUTION) ×3 IMPLANT
PACK BASIN DAY SURGERY FS (CUSTOM PROCEDURE TRAY) ×3 IMPLANT
PAD ABD 8X10 STRL (GAUZE/BANDAGES/DRESSINGS) IMPLANT
PAD DRESSING TELFA 3X8 NADH (GAUZE/BANDAGES/DRESSINGS) IMPLANT
PENCIL BUTTON HOLSTER BLD 10FT (ELECTRODE) ×1 IMPLANT
SET HNDPC FAN SPRY TIP SCT (DISPOSABLE) IMPLANT
SHEET MEDIUM DRAPE 40X70 STRL (DRAPES) IMPLANT
SLEEVE SCD COMPRESS KNEE MED (MISCELLANEOUS) ×1 IMPLANT
SOLUTION PARTIC MCRMTRX 500MG (Tissue) ×2 IMPLANT
SPONGE GAUZE 4X4 12PLY (GAUZE/BANDAGES/DRESSINGS) IMPLANT
SPONGE LAP 18X18 X RAY DECT (DISPOSABLE) ×4 IMPLANT
STAPLER VISISTAT 35W (STAPLE) IMPLANT
SUT MNCRL AB 3-0 PS2 18 (SUTURE) IMPLANT
SUT MNCRL AB 4-0 PS2 18 (SUTURE) IMPLANT
SUT SILK 3 0 PS 1 (SUTURE) IMPLANT
SWAB COLLECTION DEVICE MRSA (MISCELLANEOUS) IMPLANT
SYR BULB 3OZ (MISCELLANEOUS) IMPLANT
SYR CONTROL 10ML LL (SYRINGE) ×3 IMPLANT
TOWEL OR 17X24 6PK STRL BLUE (TOWEL DISPOSABLE) ×6 IMPLANT
TRAY DSU PREP LF (CUSTOM PROCEDURE TRAY) ×3 IMPLANT
TUBE ANAEROBIC SPECIMEN COL (MISCELLANEOUS) IMPLANT
TUBE CONNECTING 20X1/4 (TUBING) ×3 IMPLANT
UNDERPAD 30X30 INCONTINENT (UNDERPADS AND DIAPERS) ×3 IMPLANT
YANKAUER SUCT BULB TIP NO VENT (SUCTIONS) ×3 IMPLANT

## 2013-08-16 NOTE — Progress Notes (Signed)
Post Op , asked Dr. Migdalia Dk is she would like [place] to recheck blood sugar, she stated "No". Encouraged patient to call his family practice Doctor and inform him of how high his preop sugar is high.

## 2013-08-16 NOTE — Anesthesia Preprocedure Evaluation (Signed)
Anesthesia Evaluation  Patient identified by MRN, date of birth, ID band Patient awake    Reviewed: Allergy & Precautions, H&P , NPO status , Patient's Chart, lab work & pertinent test results  Airway Mallampati: II TM Distance: >3 FB Neck ROM: full    Dental no notable dental hx. (+) Teeth Intact, Dental Advisory Given   Pulmonary Current Smoker,  breath sounds clear to auscultation  Pulmonary exam normal       Cardiovascular Exercise Tolerance: Good hypertension, Pt. on medications Rhythm:regular Rate:Normal     Neuro/Psych negative neurological ROS  negative psych ROS   GI/Hepatic negative GI ROS, Neg liver ROS, (+)     substance abuse  IV drug use,   Endo/Other  diabetes, Poorly Controlled, Type 2, Insulin Dependent  Renal/GU      Musculoskeletal   Abdominal   Peds  Hematology negative hematology ROS (+)   Anesthesia Other Findings   Reproductive/Obstetrics negative OB ROS                           Anesthesia Physical Anesthesia Plan  ASA: II  Anesthesia Plan: General   Post-op Pain Management:    Induction: Intravenous  Airway Management Planned: LMA  Additional Equipment:   Intra-op Plan:   Post-operative Plan: Extubation in OR  Informed Consent:   Dental advisory given  Plan Discussed with: CRNA and Surgeon  Anesthesia Plan Comments:         Anesthesia Quick Evaluation

## 2013-08-16 NOTE — Discharge Instructions (Signed)
Apply ky gel to groin wounds every other day starting on Friday. No shower till next Wednesday morning.

## 2013-08-16 NOTE — Interval H&P Note (Signed)
History and Physical Interval Note:  08/16/2013 10:23 AM  Charles Manning  has presented today for surgery, with the diagnosis of RIGHT GROAN/SCROTUM ULCER  The various methods of treatment have been discussed with the patient and family. After consideration of risks, benefits and other options for treatment, the patient has consented to  Procedure(s): DEBRIDEMENT OF RIGHT GROIN/SCROTUM ULCER WITH PLACEMENT OF ACELL/POSSIBLE TISSUE ADVANCEMENT  (Right) as a surgical intervention .  The patient's history has been reviewed, patient examined, no change in status, stable for surgery.  I have reviewed the patient's chart and labs.  Questions were answered to the patient's satisfaction.     SANGER,Rosely Fernandez

## 2013-08-16 NOTE — Brief Op Note (Signed)
08/16/2013  11:33 AM  PATIENT:  Charles Manning  58 y.o. male  PRE-OPERATIVE DIAGNOSIS:  RIGHT GROAN/SCROTUM ULCER  POST-OPERATIVE DIAGNOSIS:  RIGHT GROAN/SCROTUM ULCER  PROCEDURE:  Procedure(s): MINOR DEBRIDEMENT OF RIGHT GROIN/SCROTUM ULCER WITH PLACEMENT OF ACELL/POSSIBLE TISSUE ADVANCEMENT  (Right)  SURGEON:  Surgeon(s) and Role:    * Emaree Chiu Sanger, DO - Primary  PHYSICIAN ASSISTANT: Shawn Rayburn, PA  ASSISTANTS: none   ANESTHESIA:   none  EBL:     BLOOD ADMINISTERED:none  DRAINS: none   LOCAL MEDICATIONS USED:  LIDOCAINE   SPECIMEN:  No Specimen  DISPOSITION OF SPECIMEN:  N/A  COUNTS:  YES  TOURNIQUET:  * No tourniquets in log *  DICTATION: .Dragon Dictation  PLAN OF CARE: Discharge to home after PACU  PATIENT DISPOSITION:  PACU - hemodynamically stable.   Delay start of Pharmacological VTE agent (>24hrs) due to surgical blood loss or risk of bleeding: no

## 2013-08-16 NOTE — Progress Notes (Signed)
Noted patient blood glucose >500. Therefore, decided to cancel this case. Dispositon of case as per Dr Migdalia Dk

## 2013-08-16 NOTE — H&P (View-Only) (Signed)
Charles Manning is an 58 y.o. male.   Chief Complaint: scrotal and groin ulcer HPI: The patient is a 57 yrs old wm here for a history and physical for treatment of a right scrotal ulcer. He states that he scratched the area over a month ago and then noticed swelling, tenderness and an ulcer. He was seen by a PCP and sent to the ED. He was admitted and underwent multiple debridements. Today he has some fibrous tissue around the edges. He also has an incision in the right groin. He is a smoker and has diabetes as well. The area is slightly red but does not look actively infected.     Past Medical History  Diagnosis Date  . HTN (hypertension) 09/25/2011  . DM (diabetes mellitus) 09/25/2011  . Degenerative joint disease 09/25/2011  . Depression 09/25/2011  . Anxiety 09/25/2011  . Chronic pain syndrome 09/25/2011  . Basal cell cancer 09/25/2011    Mid back 2007  . Melanoma 09/25/2011    Right thigh 2005  . Wears glasses     Past Surgical History  Procedure Laterality Date  . Tumor removal from right thigh    . Lymph node removal    . Incision and drainage abscess N/A 06/28/2013    Procedure: INCISION AND DRAINAGE SCROTAL ABSCESS;  Surgeon: Scott A MacDiarmid, MD;  Location: [facility];  Service: Urology;  Laterality: N/A;  . Scrotal exploration N/A 06/30/2013    Procedure: SCROTUM DEBRIDEMENT;  Surgeon: Matthew Ramsey Eskridge, MD;  Location: [facility];  Service: Urology;  Laterality: N/A;    Family History  Problem Relation Age of Onset  . Aneurysm Mother   . Cancer Father   . Liver disease Neg Hx    Social History:  reports that he has been smoking Cigarettes.  He has been smoking about 0.50 packs per day. He has never used smokeless tobacco. He reports that he drinks alcohol. He reports that he does not use illicit drugs.  Allergies:  Allergies  Allergen Reactions  . Vancomycin Itching    Able to tolerate with diphenhydramine premedication and increased vancomycin infusion time     (Not in  a hospital admission)  No results found for this or any previous visit (from the past 48 hour(s)). No results found.  Review of Systems  Constitutional: Negative.   HENT: Negative.   Eyes: Negative.   Respiratory: Negative.   Cardiovascular: Negative.   Gastrointestinal: Negative.   Genitourinary: Negative.   Musculoskeletal: Negative.   Skin: Negative.   Neurological: Negative.   Psychiatric/Behavioral: Negative.     There were no vitals taken for this visit. Physical Exam  Constitutional: He appears well-developed and well-nourished.  HENT:  Head: Normocephalic and atraumatic.  Eyes: Conjunctivae and EOM are normal. Pupils are equal, round, and reactive to light.  Cardiovascular: Normal rate.   Respiratory: Effort normal.  Genitourinary:     Musculoskeletal: Normal range of motion.  Neurological: He is alert.  Skin: Skin is warm.  Psychiatric: He has a normal mood and affect. His behavior is normal. Judgment and thought content normal.     Assessment/Plan Plan for irrigation, debridement and possible Acell versus skin graft to groin and scrotum and possible primary closure.  SANGER,Daril Warga 08/11/2013, 1:24 PM    

## 2013-08-16 NOTE — Op Note (Addendum)
Operative Note   DATE OF OPERATION: 08/16/2013  LOCATION: [facility]  SURGICAL DIVISION: Plastic Surgery  PREOPERATIVE DIAGNOSES:  Right groin and scrotum ulcer  POSTOPERATIVE DIAGNOSES:  same  PROCEDURE:  Placement of Acell powder (500 mg and sheet 5 x 7) to right groin (1 x 5 cm) and scrotum (3 x 3 cm) ulcer  SURGEON: Theodoro Kos, DO  ASSISTANT: Shawn Rayburn, PA  ANESTHESIA:  General.   COMPLICATIONS: None.   INDICATIONS FOR PROCEDURE:  The patient, Charles Manning, is a 58 y.o. male born on [birth year], is here for treatment of ulcers in the right groin.   CONSENT:  Informed consent was obtained directly from the patient. Risks, benefits and alternatives were fully discussed. Specific risks including but not limited to bleeding, infection, hematoma, seroma, scarring, pain, implant infection, implant extrusion, capsular contracture, asymmetry, wound healing problems, and need for further surgery were all discussed. The patient did have an ample opportunity to have questions answered to satisfaction.   DESCRIPTION OF PROCEDURE:  The patient was taken to the operating room. SCDs were placed and IV antibiotics were given. The patient's operative site was prepped and draped in a sterile fashion. A time out was performed and all information was confirmed to be correct.  Local anesthesia was administered.  The area was cleaned and debrided with antibiotic solution.  The Acell powder was placed on the right groin and scrotum.  The sheet was than placed on the right scrotum and the adaptic was placed over both areas and tacked in place with 5-0 Vicryl.  KY gel was applied with 4 x 4 gauze and an ABD.  The patient tolerated the procedure well.  There were no complications. The patient was taken to the recovery room in satisfactory condition.

## 2013-08-18 ENCOUNTER — Encounter (HOSPITAL_BASED_OUTPATIENT_CLINIC_OR_DEPARTMENT_OTHER): Payer: Self-pay | Admitting: Plastic Surgery

## 2013-08-30 ENCOUNTER — Telehealth: Payer: Self-pay | Admitting: *Deleted

## 2013-08-30 NOTE — Telephone Encounter (Signed)
Received notice that patient picked up harvoni from [place] on 08/29/13.  Pt instructed to begin medication that day.  Pt following up in clinic 4/16. Landis Gandy, RN

## 2013-09-01 ENCOUNTER — Ambulatory Visit (HOSPITAL_COMMUNITY): Payer: Medicare Other

## 2013-09-21 ENCOUNTER — Telehealth: Payer: Self-pay | Admitting: *Deleted

## 2013-09-21 ENCOUNTER — Ambulatory Visit: Payer: Medicare Other | Admitting: Internal Medicine

## 2013-09-21 NOTE — Telephone Encounter (Signed)
Left message informing patient of his missed visit.   Reminded patient that we need to see him by next week, as he started Hep C treatment and we need to follow up with labs.  OK to overbook per Dr. Linus Salmons. Charles Gandy, RN

## 2013-10-02 ENCOUNTER — Encounter: Payer: Self-pay | Admitting: *Deleted

## 2013-10-02 ENCOUNTER — Telehealth: Payer: Self-pay | Admitting: *Deleted

## 2013-10-02 NOTE — Telephone Encounter (Signed)
Message copied by Landis Gandy on Mon Oct 02, 2013 11:59 AM ------      Message from: Thayer Headings      Created: Fri Sep 29, 2013 12:37 PM       He needs follow up labs on Harvoni but missed his last appt.  He needs CMP, CBC and HCV viral RNA next week and follow up.  ------

## 2013-10-02 NOTE — Telephone Encounter (Signed)
Left message asking patient to call and set up an appointment this week with Dr. Linus Salmons.  OK to overbook per Dr. Linus Salmons. Landis Gandy, RN

## 2013-10-10 ENCOUNTER — Ambulatory Visit: Payer: Medicare Other | Admitting: Physician Assistant

## 2013-11-13 ENCOUNTER — Encounter: Payer: Self-pay | Admitting: Internal Medicine

## 2013-11-13 ENCOUNTER — Ambulatory Visit (INDEPENDENT_AMBULATORY_CARE_PROVIDER_SITE_OTHER): Payer: Medicare Other | Admitting: Internal Medicine

## 2013-11-13 VITALS — BP 135/93 | HR 70 | Temp 97.8°F | Ht 70.0 in | Wt 207.0 lb

## 2013-11-13 DIAGNOSIS — B1920 Unspecified viral hepatitis C without hepatic coma: Secondary | ICD-10-CM

## 2013-11-13 DIAGNOSIS — Z23 Encounter for immunization: Secondary | ICD-10-CM

## 2013-11-13 LAB — CBC WITH DIFFERENTIAL/PLATELET
BASOS ABS: 0 10*3/uL (ref 0.0–0.1)
Basophils Relative: 0 % (ref 0–1)
Eosinophils Absolute: 0.6 10*3/uL (ref 0.0–0.7)
Eosinophils Relative: 5 % (ref 0–5)
HEMATOCRIT: 41.9 % (ref 39.0–52.0)
Hemoglobin: 14.4 g/dL (ref 13.0–17.0)
LYMPHS ABS: 3.1 10*3/uL (ref 0.7–4.0)
Lymphocytes Relative: 27 % (ref 12–46)
MCH: 30.8 pg (ref 26.0–34.0)
MCHC: 34.4 g/dL (ref 30.0–36.0)
MCV: 89.7 fL (ref 78.0–100.0)
Monocytes Absolute: 1 10*3/uL (ref 0.1–1.0)
Monocytes Relative: 9 % (ref 3–12)
NEUTROS ABS: 6.8 10*3/uL (ref 1.7–7.7)
Neutrophils Relative %: 59 % (ref 43–77)
Platelets: 340 10*3/uL (ref 150–400)
RBC: 4.67 MIL/uL (ref 4.22–5.81)
RDW: 13.3 % (ref 11.5–15.5)
WBC: 11.6 10*3/uL — ABNORMAL HIGH (ref 4.0–10.5)

## 2013-11-13 LAB — COMPLETE METABOLIC PANEL WITH GFR
ALBUMIN: 3.6 g/dL (ref 3.5–5.2)
ALT: 13 U/L (ref 0–53)
AST: 14 U/L (ref 0–37)
Alkaline Phosphatase: 55 U/L (ref 39–117)
BUN: 18 mg/dL (ref 6–23)
CALCIUM: 9.4 mg/dL (ref 8.4–10.5)
CHLORIDE: 102 meq/L (ref 96–112)
CO2: 22 mEq/L (ref 19–32)
Creat: 0.93 mg/dL (ref 0.50–1.35)
GFR, Est African American: >89 mL/min
GFR, Est Non African American: >89 mL/min
Glucose, Bld: 389 mg/dL — ABNORMAL HIGH (ref 70–99)
Potassium: 4.2 mEq/L (ref 3.5–5.3)
Sodium: 135 mEq/L (ref 135–145)
Total Bilirubin: 0.3 mg/dL (ref 0.2–1.2)
Total Protein: 6.3 g/dL (ref 6.0–8.3)

## 2013-11-13 NOTE — Progress Notes (Signed)
   Subjective:    Patient ID: Charles Manning, male    DOB: 1955-11-05, 58 y.o.   MRN: [id number]  HPI  Here for follow up of HCV.  Diagnosed in hospital after needle stick accident.  I saw him in follow up and confirmed active HCV chronic infection.  He started [facility] and never returned for follow up until now.  Has had no problems and finished yesterday.  Does not think he missed any days and did take all pills (12 weeks).     Review of Systems  Constitutional: Negative for fever and fatigue.  Musculoskeletal: Negative for joint swelling.  Skin: Negative for rash.  Neurological: Negative for dizziness.       Objective:   Physical Exam  Constitutional: He appears well-developed and well-nourished. No distress.  HENT:  Mouth/Throat: No oropharyngeal exudate.  Eyes: No scleral icterus.  Cardiovascular: Normal rate, regular rhythm and normal heart sounds.   No murmur heard. Pulmonary/Chest: Effort normal and breath sounds normal. No respiratory distress.  Lymphadenopathy:    He has no cervical adenopathy.  Skin: No rash noted.          Assessment & Plan:

## 2013-11-14 NOTE — Assessment & Plan Note (Signed)
Will check viral load today and in 12 weeks to verify cure.  Also will start vaccine series for A and B.  Nurse visit in 1 month and return in 3 months if suppressed.

## 2013-11-15 LAB — HEPATITIS C RNA QUANTITATIVE: HCV Quantitative: NOT DETECTED IU/mL (ref <15)

## 2013-12-12 ENCOUNTER — Emergency Department (HOSPITAL_COMMUNITY)
Admission: EM | Admit: 2013-12-12 | Discharge: 2013-12-12 | Disposition: A | Discharge status: Home / Self Care | Payer: Medicare Other | Attending: Emergency Medicine | Admitting: Emergency Medicine

## 2013-12-12 ENCOUNTER — Encounter (HOSPITAL_COMMUNITY): Payer: Self-pay | Admitting: Emergency Medicine

## 2013-12-12 DIAGNOSIS — Z79899 Other long term (current) drug therapy: Secondary | ICD-10-CM | POA: Insufficient documentation

## 2013-12-12 DIAGNOSIS — L03119 Cellulitis of unspecified part of limb: Principal | ICD-10-CM

## 2013-12-12 DIAGNOSIS — L02419 Cutaneous abscess of limb, unspecified: Secondary | ICD-10-CM | POA: Insufficient documentation

## 2013-12-12 DIAGNOSIS — G894 Chronic pain syndrome: Secondary | ICD-10-CM | POA: Insufficient documentation

## 2013-12-12 DIAGNOSIS — F17200 Nicotine dependence, unspecified, uncomplicated: Secondary | ICD-10-CM | POA: Insufficient documentation

## 2013-12-12 DIAGNOSIS — F411 Generalized anxiety disorder: Secondary | ICD-10-CM | POA: Insufficient documentation

## 2013-12-12 DIAGNOSIS — L089 Local infection of the skin and subcutaneous tissue, unspecified: Secondary | ICD-10-CM

## 2013-12-12 DIAGNOSIS — F3289 Other specified depressive episodes: Secondary | ICD-10-CM | POA: Insufficient documentation

## 2013-12-12 DIAGNOSIS — Z794 Long term (current) use of insulin: Secondary | ICD-10-CM | POA: Insufficient documentation

## 2013-12-12 DIAGNOSIS — I10 Essential (primary) hypertension: Secondary | ICD-10-CM | POA: Insufficient documentation

## 2013-12-12 DIAGNOSIS — E119 Type 2 diabetes mellitus without complications: Secondary | ICD-10-CM | POA: Insufficient documentation

## 2013-12-12 DIAGNOSIS — M1990 Unspecified osteoarthritis, unspecified site: Secondary | ICD-10-CM | POA: Insufficient documentation

## 2013-12-12 DIAGNOSIS — Z85828 Personal history of other malignant neoplasm of skin: Secondary | ICD-10-CM | POA: Insufficient documentation

## 2013-12-12 DIAGNOSIS — F329 Major depressive disorder, single episode, unspecified: Secondary | ICD-10-CM | POA: Insufficient documentation

## 2013-12-12 DIAGNOSIS — Z791 Long term (current) use of non-steroidal anti-inflammatories (NSAID): Secondary | ICD-10-CM | POA: Insufficient documentation

## 2013-12-12 DIAGNOSIS — R739 Hyperglycemia, unspecified: Secondary | ICD-10-CM

## 2013-12-12 LAB — CBG MONITORING, ED: Glucose-Capillary: 165 mg/dL — ABNORMAL HIGH (ref 70–99)

## 2013-12-12 MED ORDER — DOXYCYCLINE HYCLATE 100 MG PO CAPS: 100.0000 mg | ORAL_CAPSULE | Freq: Two times a day (BID) | ORAL | Status: DC

## 2013-12-12 NOTE — ED Provider Notes (Signed)
CSN: [id number]     Arrival date & time 12/12/13  1235 History   First MD Initiated Contact with Patient 12/12/13 1247     Chief Complaint  Patient presents with  . Leg Pain     (Consider location/radiation/quality/duration/timing/severity/associated sxs/prior Treatment) HPI  This is a 58 year old male with a past medical history of hepatitis C, hypertension, diabetes, previous history of melanoma of the right thigh status post excision in 2005. Patient also has a recent history of scrotal infection requiring incision and drainage. Patient presents to the emergency department complaining of rash. He has a past medical history of MRSA infection. Patient states he has had some pustules on both of his legs recently. He states that about 3 days ago he a large pustule on the right outer thigh which he popped. He has had increasingly worsening redness, heat, swelling, serous drainage from the site. He denies any chills, fever,myalgias. Past Medical History  Diagnosis Date  . HTN (hypertension) 09/25/2011  . DM (diabetes mellitus) 09/25/2011  . Degenerative joint disease 09/25/2011  . Depression 09/25/2011  . Anxiety 09/25/2011  . Chronic pain syndrome 09/25/2011  . Basal cell cancer 09/25/2011    Mid back 2007  . Melanoma 09/25/2011    Right thigh 2005  . Wears glasses    Past Surgical History  Procedure Laterality Date  . Tumor removal from right thigh    . Lymph node removal    . Incision and drainage abscess N/A 06/28/2013    Procedure: INCISION AND DRAINAGE SCROTAL ABSCESS;  Surgeon: Reece Packer, MD;  Location: [facility];  Service: Urology;  Laterality: N/A;  . Scrotal exploration N/A 06/30/2013    Procedure: SCROTUM DEBRIDEMENT;  Surgeon: [name], MD;  Location: [facility];  Service: Urology;  Laterality: N/A;  . Minor irrigation and debridement of wound Right 08/16/2013    Procedure: MINOR DEBRIDEMENT OF RIGHT GROIN/SCROTUM ULCER WITH PLACEMENT OF ACELL/POSSIBLE TISSUE  ADVANCEMENT ;  Surgeon: Theodoro Kos, DO;  Location: [place];  Service: Plastics;  Laterality: Right;   Family History  Problem Relation Age of Onset  . Aneurysm Mother   . Cancer Father   . Liver disease Neg Hx    History  Substance Use Topics  . Smoking status: Current Every Day Smoker -- 0.50 packs/day    Types: Cigarettes  . Smokeless tobacco: Never Used  . Alcohol Use: Yes     Comment: seldom,     Review of Systems  Ten systems reviewed and are negative for acute change, except as noted in the HPI.    Allergies  Vancomycin  Home Medications   Prior to Admission medications   Medication Sig Start Date End Date Taking? Authorizing Provider  citalopram (CELEXA) 20 MG tablet Take 1 tablet (20 mg total) by mouth daily. 01/20/12  Yes Barton Dubois, MD  fenofibrate 160 MG tablet Take 160 mg by mouth daily.   Yes Historical Provider, MD  insulin aspart (NOVOLOG FLEXPEN) 100 UNIT/ML FlexPen Inject 2-10 Units into the skin 3 (three) times daily with meals. Sliding scale 08/23/13  Yes Historical Provider, MD  Insulin Detemir (LEVEMIR FLEXTOUCH) 100 UNIT/ML Pen 60 Units 2 (two) times daily with a meal. 08/23/13  Yes Historical Provider, MD  lisinopril (PRINIVIL,ZESTRIL) 10 MG tablet Take 10 mg by mouth daily.   Yes Historical Provider, MD  meloxicam (MOBIC) 15 MG tablet Take 15 mg by mouth daily.   Yes Historical Provider, MD  metFORMIN (GLUCOPHAGE) 500 MG tablet 500  mg 2 (two) times daily. 10/23/13  Yes Historical Provider, MD  Oxycodone HCl 10 MG TABS Take 10 mg by mouth 3 (three) times daily as needed (pain).  12/04/13  Yes Historical Provider, MD  QUEtiapine Fumarate (SEROQUEL XR) 150 MG 24 hr tablet Take 150 mg by mouth at bedtime.   Yes Historical Provider, MD  ACCU-CHEK AVIVA PLUS test strip  10/17/13   Historical Provider, MD  Blood Glucose Monitoring Suppl (ACCU-CHEK AVIVA PLUS) W/DEVICE KIT  08/23/13   Historical Provider, MD   BP 158/103  Pulse 75  Temp(Src)  98 F (36.7 C) (Oral)  Resp 16  SpO2 97% Physical Exam  Nursing note and vitals reviewed. Constitutional: He appears well-developed and well-nourished. No distress.  HENT:  Head: Normocephalic and atraumatic.  Eyes: Conjunctivae are normal. No scleral icterus.  Neck: Normal range of motion. Neck supple.  Cardiovascular: Normal rate, regular rhythm and normal heart sounds.   Pulmonary/Chest: Effort normal and breath sounds normal. No respiratory distress.  Abdominal: Soft. There is no tenderness.  Musculoskeletal: He exhibits no edema.  Neurological: He is alert.  Skin: Skin is warm and dry. He is not diaphoretic.  Multiple pustular lesions on bilateral legs. Right lateral thigh with 1 cm well demarcated target-like lesion with central crusting, surrounding erythema, warm, tender to palpation. Serous discharge from central pore.  Psychiatric: His behavior is normal.    ED Course  Procedures (including critical care time) Labs Review Labs Reviewed - No data to display  Imaging Review No results found.   EKG Interpretation None     INCISION AND DRAINAGE Performed by: Margarita Mail Consent: Verbal consent obtained. Risks and benefits: risks, benefits and alternatives were discussed Type: abscess  Body area: R thigh  Anesthesia: local infiltration  Incision was made with a scalpel.  Local anesthetic: lidocaine 1% w epinephrine  Anesthetic total: 6 ml  Complexity: complex Blunt dissection to break up loculations  Drainage: purulent  Drainage amount: minimal  Patient tolerance: Patient tolerated the procedure well with no immediate complications.    MDM   Final diagnoses:  Skin infection  Hyperglycemia    1:57 PM BP 158/103  Pulse 75  Temp(Src) 98 F (36.7 C) (Oral)  Resp 16  SpO2 97%  Patient with skin infection, hyperglycemia. It appears consistent with Staff. Discharge with bactrim and keflex. Follow up with pcp in 48-72 hours.  Return  precautions discussed.    Margarita Mail, PA-C 12/12/13 1629

## 2013-12-12 NOTE — ED Notes (Signed)
Pt states he had bumps on right thigh and now there is a open owund on thigh from bumps. Pt states he thinks it may be infected.

## 2013-12-12 NOTE — Discharge Instructions (Signed)
Community-Associated MRSA CA-MRSA stands for community-associated methicillin-resistant Staphylococcus aureus. MRSA is a type of bacteria that is resistant to some common antibiotics. It can cause infections in the skin and many other places in the body. Staphylococcus aureus, often called "staph," is a bacteria that normally lives on the skin or in the nose. Staph on the surface of the skin or in the nose does not cause problems. However, if the staph enters the body through a cut, wound, or break in the skin, an infection can happen. Up until recently, infections with the MRSA type of staph mainly occurred in hospitals and other healthcare settings. There are now increasing problems with MRSA infections in the community as well. Infections with MRSA may be very serious or even life-threatening. CA-MRSA is becoming more common. It is known to spread in crowded settings, in jails and prisons, and in situations where there is close skin-to-skin contact, such as during sporting events or in locker rooms. MRSA can be spread through shared items, such as children's toys, razors, towels, or sports equipment.  CAUSES All staph, including MRSA, are normally harmless unless they enter the body through a scratch, cut, or wound, such as with surgery. All staph, including MRSA, can be spread from person-to-person by touching contaminated objects or through direct contact.  MRSA now causes illness in people who have not been in hospitals or other healthcare facilities. Cases of MRSA diseases in the community have been associated with:  Recent antibiotic use.  Sharing contaminated towels or clothes.  Having active skin diseases.  Participating in contact sports.  Living in crowded settings.  Intravenous (IV) drug use.  Community-associated MRSA infections are usually skin infections, but may cause other severe illnesses.  Staph bacteria are one of the most common causes of skin infection. However, they are  also a common cause of pneumonia, bone or joint infections, and bloodstream infections. DIAGNOSIS Diagnosis of MRSA is done by cultures of fluid samples that may come from:  Swabs taken from cuts or wounds in infected areas.  Nasal swabs.  Saliva or deep cough specimens from the lungs (sputum).  Urine.  Blood. Many people are "colonized" with MRSA but have no signs of infection. This means that people carry the MRSA germ on their skin or in their nose and may never develop MRSA infection.  TREATMENT  Treatment varies and is based on how serious, how deep, or how extensive the infection is. For example:  Some skin infections, such as a small boil or abscess, may be treated by draining yellowish-white fluid (pus) from the site of the infection.  Deeper or more widespread soft tissue infections are usually treated with surgery to drain pus and with antibiotic medicine given by vein or by mouth. This may be recommended even if you are pregnant.  Serious infections may require a hospital stay. If antibiotics are given, they may be needed for several weeks. PREVENTION Because many people are colonized with staph, including MRSA, preventing the spread of the bacteria from person-to-person is most important. The best way to prevent the spread of bacteria and other germs is through proper hand washing or by using alcohol-based hand disinfectants. The following are other ways to help prevent MRSA infection within community settings.   Wash your hands frequently with soap and water for at least 15 seconds. Otherwise, use alcohol-based hand disinfectants when soap and water is not available.  Make sure people who live with you wash their hands often, too.  Do not share  personal items. For example, avoid sharing razors and other personal hygiene items, towels, clothing, and athletic equipment.  Wash and dry your clothes and bedding at the warmest temperatures recommended on the labels.  Keep  wounds covered. Pus from infected sores may contain MRSA and other bacteria. Keep cuts and abrasions clean and covered with germ-free (sterile), dry bandages until they are healed.  If you have a wound that appears infected, ask your caregiver if a culture for MRSA and other bacteria should be done.  If you are breastfeeding, talk to your caregiver about MRSA. You may be asked to temporarily stop breastfeeding. HOME CARE INSTRUCTIONS   Take your antibiotics as directed. Finish them even if you start to feel better.  Avoid close contact with those around you as much as possible. Do not use towels, razors, toothbrushes, bedding, or other items that will be used by others.  To fight the infection, follow your caregiver's instructions for wound care. Wash your hands before and after changing your bandages.  If you have an intravascular device, such as a catheter, make sure you know how to care for it.  Be sure to tell any healthcare providers that you have MRSA so they are aware of your infection. SEEK IMMEDIATE MEDICAL CARE IF:  The infection appears to be getting worse. Signs include:  Increased warmth, redness, or tenderness around the wound site.  A red line that extends from the infection site.  A dark color in the area around the infection.  Wound drainage that is tan, yellow, or green.  A bad smell coming from the wound.  You feel sick to your stomach (nauseous) and throw up (vomit) or cannot keep medicine down.  You have a fever.  Your baby is older than 3 months with a rectal temperature of 102 F (38.9 C) or higher.  Your baby is 1 months old or younger with a rectal temperature of 100.4 F (38 C) or higher.  You have difficulty breathing. MAKE SURE YOU:   Understand these instructions.  Will watch your condition.  Will get help right away if you are not doing well or get worse. Document Released: 08/28/2005 Document Revised: 08/17/2011 Document Reviewed:  08/28/2010 [facility] Patient Information 2015 [place], [place]. This information is not intended to replace advice given to you by your health care provider. Make sure you discuss any questions you have with your health care provider.

## 2013-12-12 NOTE — ED Provider Notes (Signed)
Medical screening examination/treatment/procedure(s) were performed by non-physician practitioner and as supervising physician I was immediately available for consultation/collaboration.   EKG Interpretation None        Delice Bison Ward, DO 12/12/13 1652

## 2013-12-13 ENCOUNTER — Ambulatory Visit: Payer: Medicare Other

## 2013-12-13 ENCOUNTER — Ambulatory Visit (INDEPENDENT_AMBULATORY_CARE_PROVIDER_SITE_OTHER): Payer: Medicare Other | Admitting: Licensed Clinical Social Worker

## 2013-12-13 DIAGNOSIS — B182 Chronic viral hepatitis C: Secondary | ICD-10-CM

## 2013-12-13 DIAGNOSIS — Z23 Encounter for immunization: Secondary | ICD-10-CM

## 2014-05-15 ENCOUNTER — Ambulatory Visit: Payer: Medicare Other | Admitting: Internal Medicine

## 2014-05-16 ENCOUNTER — Ambulatory Visit (INDEPENDENT_AMBULATORY_CARE_PROVIDER_SITE_OTHER): Payer: Medicare Other | Admitting: Endocrinology

## 2014-05-16 ENCOUNTER — Encounter: Payer: Self-pay | Admitting: Endocrinology

## 2014-05-16 VITALS — BP 132/80 | HR 66 | Temp 98.1°F | Resp 16 | Ht 70.0 in | Wt 208.2 lb

## 2014-05-16 DIAGNOSIS — G629 Polyneuropathy, unspecified: Secondary | ICD-10-CM

## 2014-05-16 DIAGNOSIS — IMO0002 Reserved for concepts with insufficient information to code with codable children: Secondary | ICD-10-CM

## 2014-05-16 DIAGNOSIS — E1142 Type 2 diabetes mellitus with diabetic polyneuropathy: Secondary | ICD-10-CM

## 2014-05-16 DIAGNOSIS — E1165 Type 2 diabetes mellitus with hyperglycemia: Secondary | ICD-10-CM

## 2014-05-16 DIAGNOSIS — E1342 Other specified diabetes mellitus with diabetic polyneuropathy: Secondary | ICD-10-CM

## 2014-05-16 NOTE — Patient Instructions (Signed)
Please check blood sugars at least half the time about 2 hours after any meal and at least every other day on waking up. Please bring blood sugar monitor to each visit  LEVEMIR insulin: Continue 60 units twice a day until finished  Once Levemir insulin is finished start Toujeo insulin 100 units in the morning before breakfast daily  NOVOLOG insulin: Take 15 units before breakfast, 20 units before lunch and 25 units before supper. May reduce the NovoLog if eating a smaller meal are planning to be very active after the meal

## 2014-05-16 NOTE — Progress Notes (Signed)
Patient ID: Charles Manning, male   DOB: [birth year], 58 y.o.   MRN: [id number]           Reason for Appointment: Consultation for Type 2 Diabetes  Referring physician: Finger  History of Present Illness:          Diagnosis: Type 2 diabetes mellitus, date of diagnosis:        Past history:  He thinks he has been taking insulin for the last 20 years or so with various regimens Initially may have taken Glucophage/metformin and does not know what other oral agents he may have tried previously At some point he was taking Lantus insulin for a few years in increasing doses but for the last 5-6 months has been taking Levemir for unknown reasons He says his blood sugars have been poorly controlled and in 2014 A1c was probably 12% In the last year his A1c has stayed in the 8.9-10.1 range  Recent history:  He does not know when he was started on NovoLog but is basically taking a small dose with sliding scale His NovoLog dose is Ranging from 5-10 units with meals.  He does not always remember to take this at mealtimes.  Sometimes will take the insulin before at other times after eating  He has also been continued on metformin for the last few years but has not tried any other drugs with  his insulin   He does complain of excessive thirst and some fatigue.  Usually drinking water or diet drinks when thirsty. He has not had any diabetes education on meal planning instructions and does not follow any particular diet. Does not restrict fat intake in his diet and usually has a meat at every meal, sometimes high fat content He does not have any formal exercise program  He is checking his blood sugar somewhat sporadically and does not know what meter he is using currently.  He does think he has an Accu-Chek meter at home Because of his recent A1c of 9.5 he is referred here for further management        Oral hypoglycemic drugs the patient is taking are: Metformin 1 yr     Side effects from medications have  been: INSULIN regimen is described as:  Levemir 60 bid, Novolog ac/pc, forgets Compliance with the medical regimen: Fair Hypoglycemia:  At times acb, rare in 70   Glucose monitoring:  done one time a day         Glucometer: [facility] ?      Blood Glucose readings by  Recall 175-200in the morning , pm  readings are around 300, Checking sometimes after dinner   Self-care: The diet that the patient has been following is: tries to limit starchy foods .     Meals: 2-3 meals per day. Breakfast is bacon, eggs and milk.  Will have hamburger or ribs at meals           Exercise at times will be active at work    Dietician visit, most recent:None.               Weight history: Wt Readings from Last 3 Encounters:  05/16/14 208 lb 3.2 oz (94.439 kg)  11/13/13 207 lb (93.895 kg)  08/16/13 190 lb (86.183 kg)    Glycemic control:   Lab Results  Component Value Date   HGBA1C 14.7* 06/27/2013   HGBA1C 13.4* 03/23/2012   HGBA1C 7.3* 01/15/2010   Lab Results  Component Value Date   MICROALBUR  1.12 01/15/2010   LDLCALC 84 06/17/2009   CREATININE 0.93 11/13/2013         Medication List       This list is accurate as of: 05/16/14 11:59 PM.  Always use your most recent med list.               ACCU-CHEK AVIVA PLUS test strip  Generic drug:  glucose blood     ACCU-CHEK AVIVA PLUS W/DEVICE Kit     BOOSTRIX 5-2.5-18.5 LF-MCG/0.5 injection  Generic drug:  Tdap     ciprofloxacin 500 MG tablet  Commonly known as:  CIPRO     citalopram 20 MG tablet  Commonly known as:  CELEXA  Take 1 tablet (20 mg total) by mouth daily.     doxycycline 100 MG capsule  Commonly known as:  VIBRAMYCIN  Take 1 capsule (100 mg total) by mouth 2 (two) times daily.     fenofibrate 160 MG tablet  Take 160 mg by mouth daily.     LEVEMIR FLEXTOUCH 100 UNIT/ML Pen  Generic drug:  Insulin Detemir  60 Units 2 (two) times daily with a meal.     lisinopril 10 MG tablet  Commonly known as:  PRINIVIL,ZESTRIL    Take 10 mg by mouth daily.     meloxicam 15 MG tablet  Commonly known as:  MOBIC  Take 15 mg by mouth daily.     metFORMIN 500 MG tablet  Commonly known as:  GLUCOPHAGE  500 mg 2 (two) times daily.     NOVOLOG FLEXPEN 100 UNIT/ML FlexPen  Generic drug:  insulin aspart  Inject 2-10 Units into the skin 3 (three) times daily with meals. Sliding scale     Oxycodone HCl 10 MG Tabs  Take 10 mg by mouth 3 (three) times daily as needed (pain).     oxymorphone 10 MG tablet  Commonly known as:  OPANA  3 (three) times daily.     SEROQUEL XR 150 MG 24 hr tablet  Generic drug:  QUEtiapine Fumarate  Take 150 mg by mouth at bedtime.     tiZANidine 2 MG tablet  Commonly known as:  ZANAFLEX        Allergies:  Allergies  Allergen Reactions  . Vancomycin Itching    Able to tolerate with diphenhydramine premedication and increased vancomycin infusion time    Past Medical History  Diagnosis Date  . HTN (hypertension) 09/25/2011  . DM (diabetes mellitus) 09/25/2011  . Degenerative joint disease 09/25/2011  . Depression 09/25/2011  . Anxiety 09/25/2011  . Chronic pain syndrome 09/25/2011  . Basal cell cancer 09/25/2011    Mid back 2007  . Melanoma 09/25/2011    Right thigh 2005  . Wears glasses     Past Surgical History  Procedure Laterality Date  . Tumor removal from right thigh    . Lymph node removal    . Incision and drainage abscess N/A 06/28/2013    Procedure: INCISION AND DRAINAGE SCROTAL ABSCESS;  Surgeon: Reece Packer, MD;  Location: [facility];  Service: Urology;  Laterality: N/A;  . Scrotal exploration N/A 06/30/2013    Procedure: SCROTUM DEBRIDEMENT;  Surgeon: [name], MD;  Location: [facility];  Service: Urology;  Laterality: N/A;  . Minor irrigation and debridement of wound Right 08/16/2013    Procedure: MINOR DEBRIDEMENT OF RIGHT GROIN/SCROTUM ULCER WITH PLACEMENT OF ACELL/POSSIBLE TISSUE ADVANCEMENT ;  Surgeon: Theodoro Kos, DO;  Location: [place];  Service: Plastics;  Laterality: Right;    Family History  Problem Relation Age of Onset  . Aneurysm Mother   . Cancer Father   . Liver disease Neg Hx   . Diabetes Paternal Grandfather     Social History:  reports that he has been smoking Cigarettes.  He has been smoking about 0.50 packs per day. He has never used smokeless tobacco. He reports that he drinks about 0.6 oz of alcohol per week. He reports that he uses illicit drugs.    Review of Systems       Vision is normal. Most recent eye exam was 1  year ago       Lipids: Has had variable levels in 2015, initially 124 and recently 90.  Not on a statin drug       Lab Results  Component Value Date   CHOL 146 06/17/2009   HDL 41 06/17/2009   LDLCALC 84 06/17/2009   TRIG 105 06/17/2009   CHOLHDL 3.6 Ratio 06/17/2009                  Skin: No rash  He has had recurrence infections of his right upper thigh, reportedly MRSA and has had various antibiotics including Cipro recently     Thyroid:  No  unusual fatigue.No history of thyroid disease     The blood pressure has been Normal without medications, not on ACE inhibitor     No swelling of left side.  He has had lymphedema of the right side after surgery including groin dissection.  He has had significant anxiety and depression taking Seroquel and Celexa     No shortness of breath or chest tightness  on exertion.     Bowel habits: Normal.      He has had significant joint pains for which he is on multiple medications including Dilaudid       He has a history of Numbness, tingling and pain in feet A lower legs including at night, taking only narcotic pain medications  He has a history of hepatitis C treated earlier in 2015   LABS:  No visits with results within 1 Week(s) from this visit. Latest known visit with results is:  Admission on 12/12/2013, Discharged on 12/12/2013  Component Date Value Ref Range Status  . Glucose-Capillary 12/12/2013 165*  70 - 99 mg/dL Final    Physical Examination:  BP 132/80 mmHg  Pulse 66  Temp(Src) 98.1 F (36.7 C)  Resp 16  Ht '5\' 10"'  (1.778 m)  Wt 208 lb 3.2 oz (94.439 kg)  BMI 29.87 kg/m2  SpO2 96%  GENERAL:  averagely built and nourished, mild abdominal obesity present HEENT:         Eye exam shows normal external appearance. Fundus exam shows no retinopathy.  Oral exam shows normal mucosa .  NECK:         General:  Neck exam shows no lymphadenopathy. Carotids are normal to palpation and no bruit heard.  Thyroid is not enlarged and no nodules felt.   LUNGS:         Chest is symmetrical. Lungs are clear to auscultation.Marland Kitchen   HEART:         Heart sounds:  S1 and S2 are normal. No murmurs or clicks heard., no S3 or S4.   ABDOMEN:   There is no distention present. Liver and spleen are not palpable. No other mass or tenderness present.  EXTREMITIES:     There is 3+ right leg edema and none  on the left.  Has healing scabs of shallow ulcers on the right upper outer thigh with minimal surrounding redness  NEUROLOGICAL:   Vibration sense is mildly reduced in toes. Ankle and biceps jerks are absent bilaterally.          Diabetic foot exam shows normal monofilament sensation in the toes and plantar surfaces, no skin lesions or ulcers on the feet and normal pedal pulses MUSCULOSKELETAL:       There is no enlargement or deformity of the joints. Spine is normal to inspection.Marland Kitchen   SKIN:       No rash or lesions of concern.        ASSESSMENT:  Diabetes type 2, uncontrolled    He has had poorly-controlled diabetes for the last few years and appears to be significantly insulin resistant despite BMI of below 30. Currently appears to be having mostly postprandial hyperglycemia and is taking relatively small doses of NovoLog compared to his basal insulin He also has a poor diet and little knowledge about diabetes management Currently not monitoring blood sugars consistently and not clear what his blood sugar  patterns are  Complications: He does have symptoms suggestive of neuropathy although no sensory loss on exam.  No history of microalbuminuria as of 5/15  Also has history of mild hypercholesterolemia, currently untreated  Other medical problems including history of hepatitis C, chronic pain syndrome, depression, right-sided lymphedema and recurrent skin infections on right leg, currently healed  PLAN:   He will continue his Levemir insulin until finished and then switched to [place].  This should provide more efficient insulin action and once a day convenience.  He will try to use 100 units once a day as directed and will have them adjust the doses on follow-up based on fasting blood sugars  He will increase the dose of his NovoLog and for now will take fixed doses unless he is very active.  He was started using an Accu-Chek meter and bring this for download on the next visit  Encouraged him to reduce fat intake and diet  Consultation with dietitian and nurse educator  Consider adding Invokana or Jardiance if blood sugars are not well controlled  For now we'll continue metformin  Patient Instructions  Please check blood sugars at least half the time about 2 hours after any meal and at least every other day on waking up. Please bring blood sugar monitor to each visit  LEVEMIR insulin: Continue 60 units twice a day until finished  Once Levemir insulin is finished start Toujeo insulin 100 units in the morning before breakfast daily  NOVOLOG insulin: Take 15 units before breakfast, 20 units before lunch and 25 units before supper. May reduce the NovoLog if eating a smaller meal are planning to be very active after the meal    Counseling time over 50% of today's 50 minute visit  Rein Popov 05/17/2014, 8:10 AM   Note: This office note was prepared with Estate agent. Any transcriptional errors that result from this process are unintentional.

## 2014-05-17 ENCOUNTER — Encounter: Payer: Self-pay | Admitting: Endocrinology

## 2014-05-17 MED ORDER — INSULIN GLARGINE 300 UNIT/ML ~~LOC~~ SOPN: 110.0000 [IU] | PEN_INJECTOR | Freq: Every day | SUBCUTANEOUS | Status: DC

## 2014-05-29 ENCOUNTER — Encounter: Payer: Medicare Other | Admitting: Nutrition

## 2014-06-06 ENCOUNTER — Ambulatory Visit (INDEPENDENT_AMBULATORY_CARE_PROVIDER_SITE_OTHER): Payer: Medicare Other | Admitting: Endocrinology

## 2014-06-06 ENCOUNTER — Encounter: Payer: Self-pay | Admitting: Endocrinology

## 2014-06-06 ENCOUNTER — Encounter: Payer: Medicare Other | Attending: Endocrinology | Admitting: Nutrition

## 2014-06-06 VITALS — BP 118/79 | HR 81 | Temp 98.2°F | Resp 16 | Ht 70.0 in | Wt 199.6 lb

## 2014-06-06 DIAGNOSIS — Z713 Dietary counseling and surveillance: Secondary | ICD-10-CM | POA: Insufficient documentation

## 2014-06-06 DIAGNOSIS — F32A Depression, unspecified: Secondary | ICD-10-CM

## 2014-06-06 DIAGNOSIS — E1342 Other specified diabetes mellitus with diabetic polyneuropathy: Secondary | ICD-10-CM

## 2014-06-06 DIAGNOSIS — IMO0002 Reserved for concepts with insufficient information to code with codable children: Secondary | ICD-10-CM

## 2014-06-06 DIAGNOSIS — G629 Polyneuropathy, unspecified: Secondary | ICD-10-CM

## 2014-06-06 DIAGNOSIS — E1142 Type 2 diabetes mellitus with diabetic polyneuropathy: Secondary | ICD-10-CM

## 2014-06-06 DIAGNOSIS — I10 Essential (primary) hypertension: Secondary | ICD-10-CM

## 2014-06-06 DIAGNOSIS — F329 Major depressive disorder, single episode, unspecified: Secondary | ICD-10-CM

## 2014-06-06 DIAGNOSIS — E1165 Type 2 diabetes mellitus with hyperglycemia: Secondary | ICD-10-CM

## 2014-06-06 DIAGNOSIS — E118 Type 2 diabetes mellitus with unspecified complications: Secondary | ICD-10-CM

## 2014-06-06 NOTE — Progress Notes (Signed)
Patient ID: Charles Manning, male   DOB: Feb 02, 1956, 58 y.o.   MRN: [id number]           Reason for Appointment:  F/u for Type 2 Diabetes  Referring physician: Finger  History of Present Illness:          Diagnosis: Type 2 diabetes mellitus, date of diagnosis:        Past history:  He thinks he has been taking insulin for the last 20 years or so with various regimens Initially may have taken Glucophage/metformin and does not know what other oral agents he may have tried previously At some point he was taking Lantus insulin for a few years in increasing doses but for the last 5-6 months has been taking Levemir for unknown reasons He says his blood sugars have been poorly controlled and in 2014 A1c was probably 12% In the last year his A1c has stayed in the 8.9-10.1 range  Recent history:  He was referred here because of A1c of 9.5% and poor control with large doses of Levemir insulin along with sliding scale NovoLog Also he had had minimal diabetes education and appeared to be following up or diet along with no exercise He reported readings as high as 300 after meals in the evenings and was having some symptoms of increased thirst Because of his large insulin requirement he was changed from Levemir to [facility] He was told to take 100 units once a day but he says that he was not paying attention to his instructions and is taking 80 units twice a day of Toujeo His blood sugars appear to be much lower although he has records on his monitor only for the last 3 days Because of tendency to low normal blood sugars and fear of hypoglycemia he has taken his NovoLog only once when he had readings of over 300 after evening meal Also had a low sugar of 60 last night at 2:30 AM otherwise blood sugars have been mostly between 67-116 He has also tried to cut back a little on his fat intake and avoiding eating out as much He did see the nurse educator today for diabetes teaching       Oral hypoglycemic  drugs the patient is taking are: Metformin 1 yr     Side effects from medications have been: INSULIN regimen is described as:  Toujeo 80 twice a day, Novolog variable doses Compliance with the medical regimen: Fair Hypoglycemia:     Glucose monitoring:  done one time a day         Glucometer:  Accu-Chek     Blood Glucose readings by  monitor download as above   Self-care: The diet that the patient has been following is: tries to limit starchy foods .     Meals: 2-3 meals per day. Breakfast is bacon, eggs and milk.  Will have hamburger or ribs at meals           Exercise at times will be active at work    Dietician visit, most recent:None.               Weight history:  Wt Readings from Last 3 Encounters:  06/06/14 199 lb 9.6 oz (90.538 kg)  05/16/14 208 lb 3.2 oz (94.439 kg)  11/13/13 207 lb (93.895 kg)    Glycemic control:   Lab Results  Component Value Date   HGBA1C 14.7* 06/27/2013   HGBA1C 13.4* 03/23/2012   HGBA1C 7.3* 01/15/2010   Lab Results  Component Value Date   MICROALBUR 1.12 01/15/2010   LDLCALC 84 06/17/2009   CREATININE 0.93 11/13/2013         Medication List       This list is accurate as of: 06/06/14  9:28 PM.  Always use your most recent med list.               ACCU-CHEK AVIVA PLUS test strip  Generic drug:  glucose blood     ACCU-CHEK AVIVA PLUS W/DEVICE Kit     BOOSTRIX 5-2.5-18.5 LF-MCG/0.5 injection  Generic drug:  Tdap     citalopram 20 MG tablet  Commonly known as:  CELEXA  Take 1 tablet (20 mg total) by mouth daily.     fenofibrate 160 MG tablet  Take 160 mg by mouth daily.     Insulin Glargine 300 UNIT/ML Sopn  Commonly known as:  TOUJEO SOLOSTAR  Inject 110 Units into the skin daily before breakfast.     LEVEMIR FLEXTOUCH 100 UNIT/ML Pen  Generic drug:  Insulin Detemir  60 Units 2 (two) times daily with a meal.     lisinopril 10 MG tablet  Commonly known as:  PRINIVIL,ZESTRIL  Take 10 mg by mouth daily.      meloxicam 15 MG tablet  Commonly known as:  MOBIC  Take 15 mg by mouth daily.     metFORMIN 500 MG tablet  Commonly known as:  GLUCOPHAGE  500 mg 2 (two) times daily.     NOVOLOG FLEXPEN 100 UNIT/ML FlexPen  Generic drug:  insulin aspart  Inject 2-10 Units into the skin 3 (three) times daily with meals. Sliding scale patient says he takes 1-2 times per day     Oxycodone HCl 10 MG Tabs  Take 10 mg by mouth 3 (three) times daily as needed (pain).     oxymorphone 10 MG tablet  Commonly known as:  OPANA  3 (three) times daily.     SEROQUEL XR 150 MG 24 hr tablet  Generic drug:  QUEtiapine Fumarate  Take 150 mg by mouth at bedtime.     tiZANidine 2 MG tablet  Commonly known as:  ZANAFLEX        Allergies:  Allergies  Allergen Reactions  . Vancomycin Itching    Able to tolerate with diphenhydramine premedication and increased vancomycin infusion time    Past Medical History  Diagnosis Date  . HTN (hypertension) 09/25/2011  . DM (diabetes mellitus) 09/25/2011  . Degenerative joint disease 09/25/2011  . Depression 09/25/2011  . Anxiety 09/25/2011  . Chronic pain syndrome 09/25/2011  . Basal cell cancer 09/25/2011    Mid back 2007  . Melanoma 09/25/2011    Right thigh 2005  . Wears glasses     Past Surgical History  Procedure Laterality Date  . Tumor removal from right thigh    . Lymph node removal    . Incision and drainage abscess N/A 06/28/2013    Procedure: INCISION AND DRAINAGE SCROTAL ABSCESS;  Surgeon: Reece Packer, MD;  Location: [facility];  Service: Urology;  Laterality: N/A;  . Scrotal exploration N/A 06/30/2013    Procedure: SCROTUM DEBRIDEMENT;  Surgeon: [name], MD;  Location: [facility];  Service: Urology;  Laterality: N/A;  . Minor irrigation and debridement of wound Right 08/16/2013    Procedure: MINOR DEBRIDEMENT OF RIGHT GROIN/SCROTUM ULCER WITH PLACEMENT OF ACELL/POSSIBLE TISSUE ADVANCEMENT ;  Surgeon: Theodoro Kos, DO;  Location: [place];  Service: Plastics;  Laterality:  Right;    Family History  Problem Relation Age of Onset  . Aneurysm Mother   . Cancer Father   . Liver disease Neg Hx   . Diabetes Paternal Grandfather     Social History:  reports that he has been smoking Cigarettes.  He has been smoking about 0.50 packs per day. He has never used smokeless tobacco. He reports that he drinks about 0.6 oz of alcohol per week. He reports that he uses illicit drugs.    Review of Systems         Lipids: Has had variable LDL levels in 2015, initially 124 and recently 90.  Not on a statin drug       Lab Results  Component Value Date   CHOL 146 06/17/2009   HDL 41 06/17/2009   LDLCALC 84 06/17/2009   TRIG 105 06/17/2009   CHOLHDL 3.6 Ratio 06/17/2009                  The blood pressure has been Normal with lisinopril, recently started on ACE inhibitor    He has a history of Numbness, tingling and pain in feet and lower legs including at night, taking only narcotic pain medications  He says he is feeling somewhat down and depressed and has not discussed this with PCP.  Currently taking Seroquel only   Physical Examination:  BP 118/79 mmHg  Pulse 81  Temp(Src) 98.2 F (36.8 C)  Resp 16  Ht '5\' 10"'  (1.778 m)  Wt 199 lb 9.6 oz (90.538 kg)  BMI 28.64 kg/m2  SpO2 97%  No edema  ASSESSMENT:  Diabetes type 2, uncontrolled    He has had much lower blood sugars from switching insulin from Levemir to Toujeo and is starting to get low normal readings Also except for once has not needed NovoLog to cover his meals However he is taking 20 units of Toujeo or more than what he was prescribed He has had some diabetes education done today and also discussed consistent low-fat diet  PLAN:   He will continue his Toujeo insulin but change it to once a day, 80 units and discussed how to adjust this every 3-4 days by 5 units and discussed blood sugar targets for a fasting glucose  Discussed when to start  NovoLog for postprandial hyperglycemia  Asked him  to review his written instructions and call if he has any questions   Discussed timing and targets of blood sugars at various times  Continue metformin unchanged  Regular walking for exercise  More consistent glucose monitoring on the Accu-Chek monitor which she will continue to bring on each visit  Follow-up with PCP for depression  Patient Instructions  Start Toujeo insulin 80 units in the morning before breakfast daily   ADJUST EVERY 3-4 DAYS BY 5 UNITS TO KEEP AM SUGAR IN 80-130 RANGE  NOVOLOG insulin: ONLY IF SUGAR AFTER MEALS GOES UP > 50 MG Take 5 units before breakfast, 10 units before lunch and 10 units before supper.      Counseling time over 50% of today's 25 minute visit  Chrisma Hurlock 06/06/2014, 9:28 PM   Note: This office note was prepared with Estate agent. Any transcriptional errors that result from this process are unintentional.

## 2014-06-06 NOTE — Progress Notes (Signed)
Pt. Is here to review blood sugar readings and diet.   Typical day: 9-9:30AM  Get up have coffee with splenda 10:30-11AM:  60u of Toujeo  He started this 2 1/2 days ago. 11AM:  Bfast of 2 eggs and 1/2 piece of toast with bacon, or something from supper the night before--meat, 2 veg. (one starchy), diet coke to drink 3-5PM: lunch (largest meal)  8-10 ounces of meat, 1 starchy veg., 1 non starchy veg., salad with dressing, and diet Coke.  3 X/wk will have 4 ounces ocean spray cranberry juice watered down with lots of ice.  9PM: sandwich, with diet coke, or bacon and eggs and 1/2 piece of toast.  Diet drink. 10PM: 60u of Toujeo  SBGM:   Last 3 days since starting Toujeo: Using aviva meter: testing multiple times during the day:  FBSs: 74-104, acL (3-4PM): 60,74, acS:( 8-9PM): 71, 72  HS: 116 (3hr. 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  He has taken no Novolog in the last 2 days due to low blood sugars.  He is not symptomatic of low blood sugars at 60, or in the 70s.  Discussed symptoms and treatment of low blood sugars.  He was given glucose tablets to keep with him for this.    He used to adjust the Novolog dose for his meal size, but no in proportion to the amount of carbs eaten.  We reviewed what carbs were, and the fact that he will need more Novolog depending on the amount of carbs eaten, and the amount of activity he will be doing after the meal.  He reported good understanding of this and had no final questions.    I did not stop his 4 ounces of juice at supper meal, since he is limiting his carbs to no more than 1-2 servings.  I suggested he test his blood sugars 2 hours after this meal, and if over 170, he will need to add 1 extra unit of Novolog to the meal when he drinks this.  He agreed to do this.

## 2014-06-06 NOTE — Patient Instructions (Signed)
Start Toujeo insulin 80 units in the morning before breakfast daily   ADJUST EVERY 3-4 DAYS BY 5 UNITS TO KEEP AM SUGAR IN 80-130 RANGE  NOVOLOG insulin: ONLY IF SUGAR AFTER MEALS GOES UP > 50 MG Take 5 units before breakfast, 10 units before lunch and 10 units before supper.

## 2014-06-06 NOTE — Patient Instructions (Signed)
Test blood sugars before and 2hr. After lunch meal, when drinking cranberry juice.  If 2hr. Reading is over 170, add 1 extra unit of Novolog before that meal.

## 2014-06-07 ENCOUNTER — Ambulatory Visit: Payer: Medicare Other | Admitting: Endocrinology

## 2014-06-26 ENCOUNTER — Encounter: Payer: Self-pay | Admitting: Endocrinology

## 2014-09-12 ENCOUNTER — Other Ambulatory Visit: Payer: Self-pay | Admitting: Endocrinology

## 2015-02-09 IMAGING — CR DG ANKLE COMPLETE 3+V*R*
3 series · 3 of 3 positions shown · non-contrast
Comparison: None

CLINICAL DATA: Chronic right ankle pain and swelling really bad
last 3 days, history arthritis, hypertension, diabetes

EXAM:
RIGHT ANKLE - COMPLETE 3+ VIEW

[x ankle ap right]
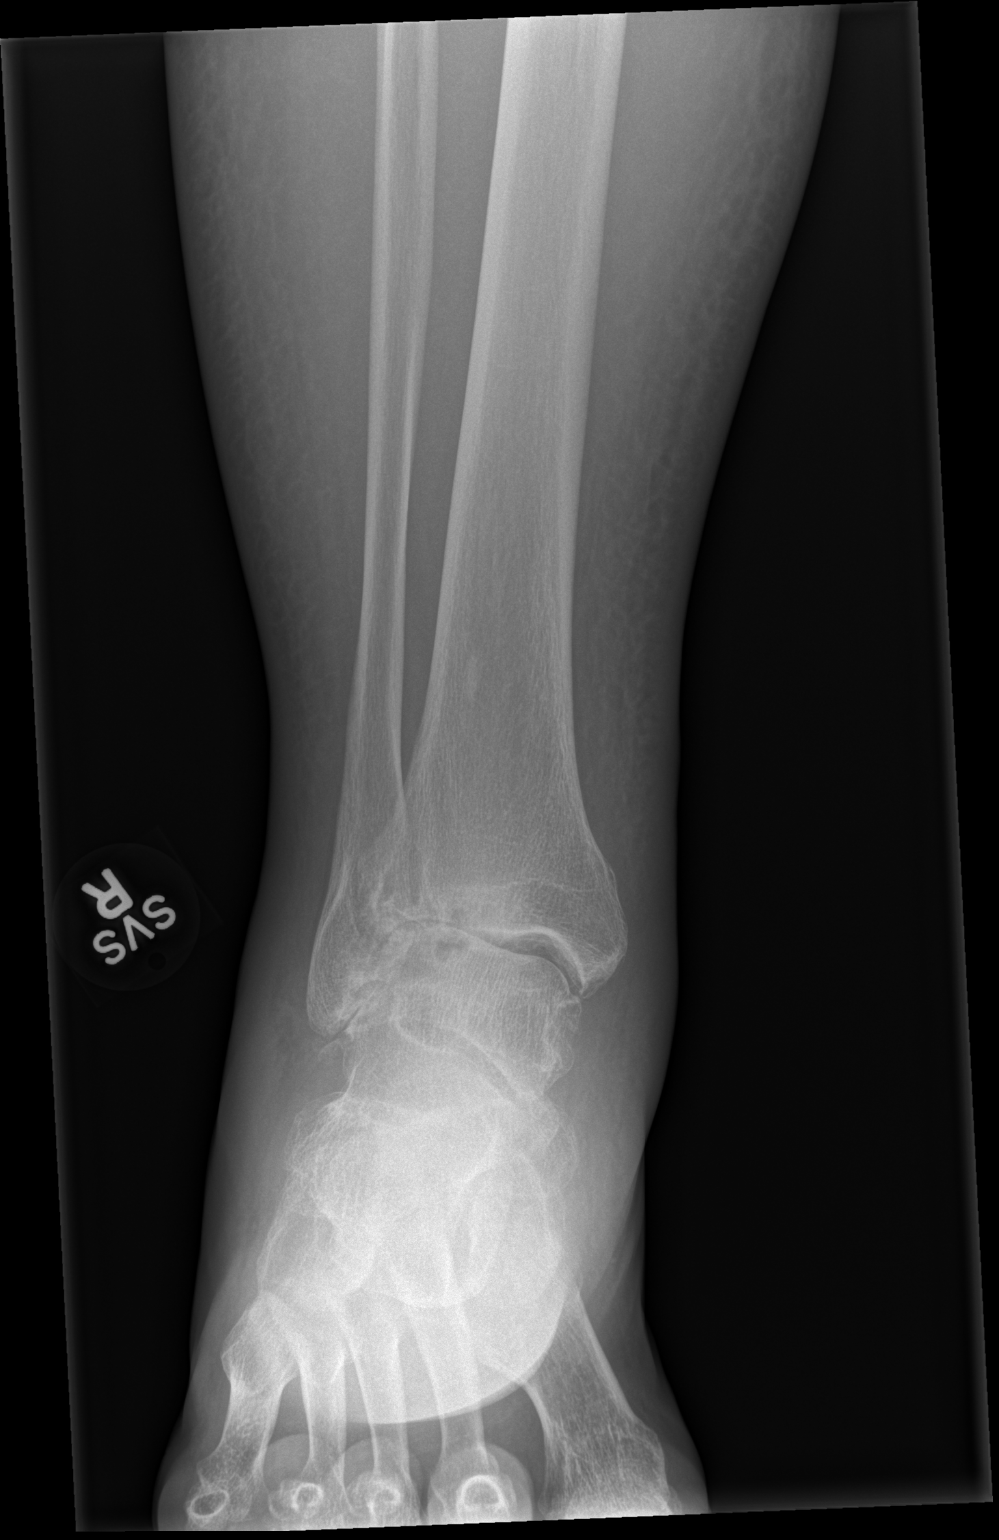

[x ankle obl right]
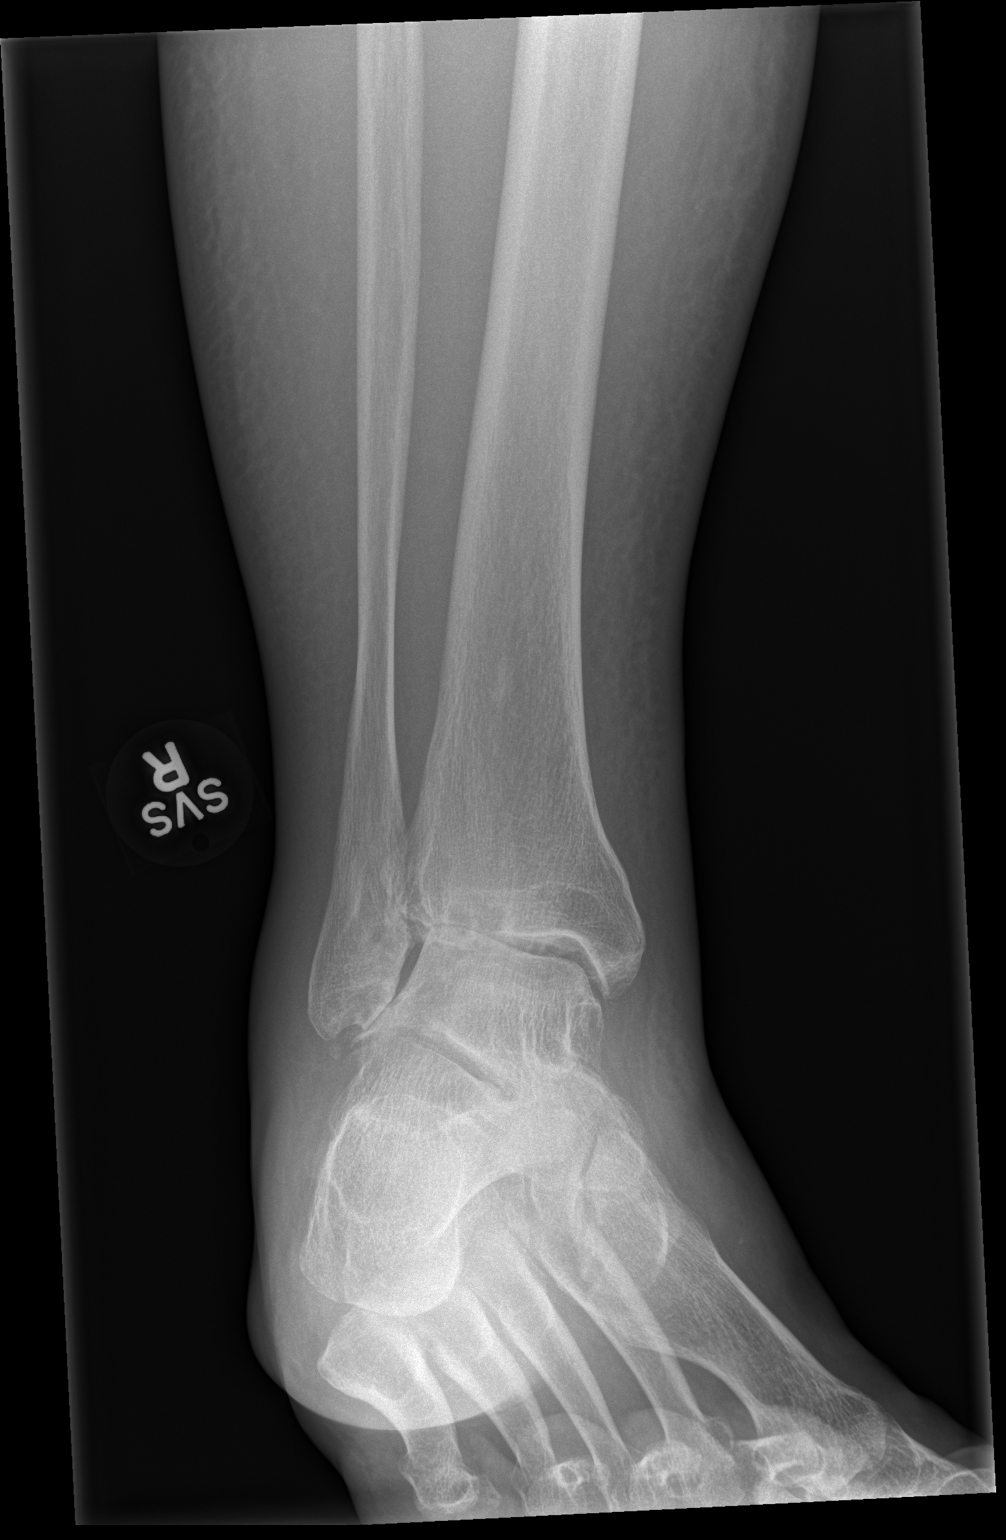

[x ankle lat right]
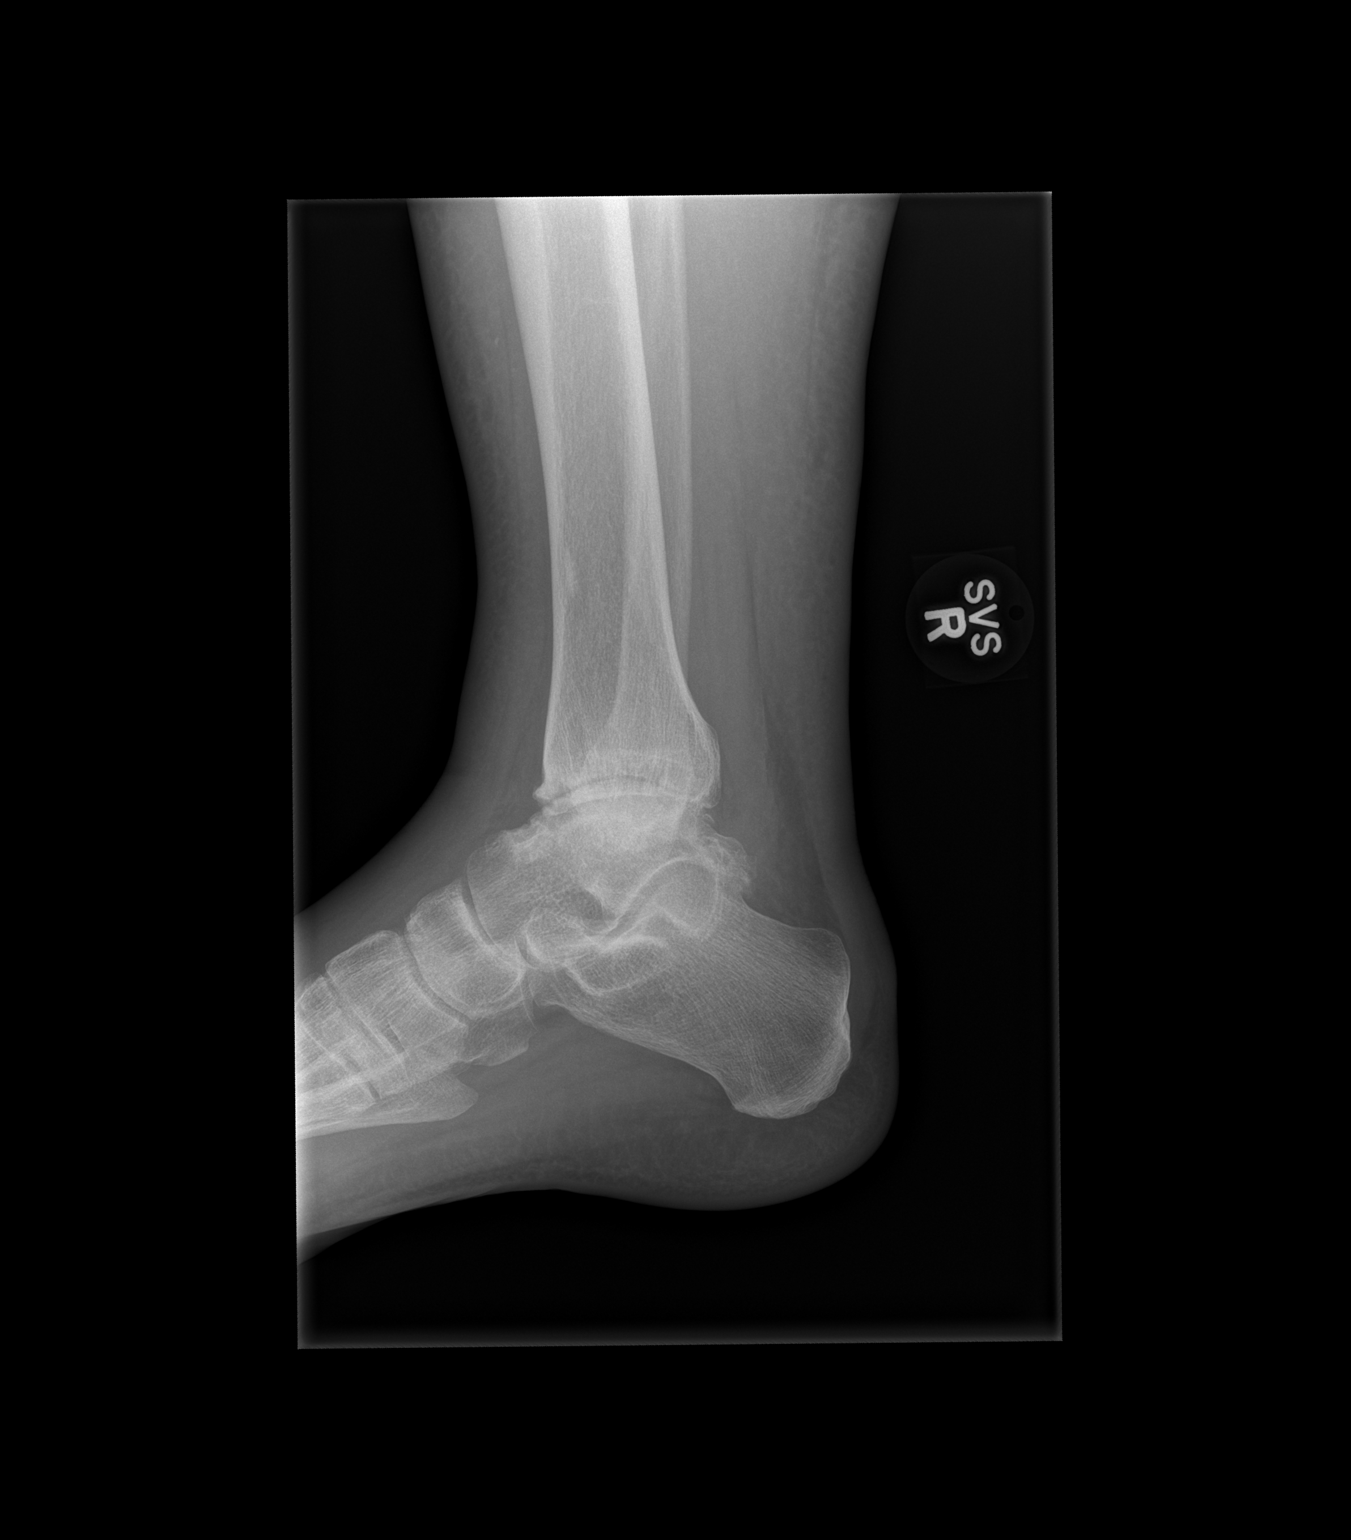

[3 of 3 positions shown; findings below may reference images not displayed]

FINDINGS: Osseous demineralization.

Advanced degenerative changes of the right ankle joint with joint
space narrowing, minimal spur formation, and subchondral cyst
formation.

Mild widening of medial ankle mortise.

Diffuse regional soft tissue swelling.

No acute fracture, dislocation or bone destruction.
IMPRESSION: Advanced degenerative changes right ankle.

Osseous demineralization and diffuse regional soft tissue swelling.

No acute fracture dislocation.

## 2015-06-12 ENCOUNTER — Ambulatory Visit (INDEPENDENT_AMBULATORY_CARE_PROVIDER_SITE_OTHER): Payer: 59 | Admitting: Endocrinology

## 2015-06-12 ENCOUNTER — Other Ambulatory Visit: Payer: Self-pay | Admitting: *Deleted

## 2015-06-12 ENCOUNTER — Encounter: Payer: Self-pay | Admitting: Endocrinology

## 2015-06-12 VITALS — BP 140/86 | HR 73 | Temp 97.8°F | Resp 16 | Ht 70.0 in | Wt 210.2 lb

## 2015-06-12 DIAGNOSIS — E1165 Type 2 diabetes mellitus with hyperglycemia: Secondary | ICD-10-CM

## 2015-06-12 DIAGNOSIS — Z794 Long term (current) use of insulin: Secondary | ICD-10-CM

## 2015-06-12 MED ORDER — GABAPENTIN 300 MG PO CAPS: 300.0000 mg | ORAL_CAPSULE | Freq: Three times a day (TID) | ORAL | Status: DC

## 2015-06-12 MED ORDER — INSULIN DEGLUDEC 200 UNIT/ML ~~LOC~~ SOPN: 95.0000 [IU] | PEN_INJECTOR | Freq: Every day | SUBCUTANEOUS | Status: DC

## 2015-06-12 MED ORDER — INSULIN ASPART 100 UNIT/ML FLEXPEN: 15.0000 [IU] | PEN_INJECTOR | Freq: Three times a day (TID) | SUBCUTANEOUS | Status: DC

## 2015-06-12 MED ORDER — METFORMIN HCL ER 500 MG PO TB24: 2000.0000 mg | ORAL_TABLET | Freq: Every day | ORAL | Status: DC

## 2015-06-12 NOTE — Progress Notes (Signed)
Patient ID: [facility], male   DOB: September 11, 1955, 60 y.o.   MRN: [id number]           Reason for Appointment:  Follow-up for Type 2 Diabetes  Referring physician: Finger  History of Present Illness:          Diagnosis: Type 2 diabetes mellitus, date of diagnosis:        Past history:  Apparently has been taking insulin for the last 20 years or so with various regimens Initially may have taken Glucophage/metformin and does not know what other oral agents he may have tried previously On his initial consultation he was taking Levemir, has also taken Lantus Previous A1c levels had been about 9-10 but as high as 14.7 in 1/15 On his initial consultation he was switched from Levemir to [facility] and also advised to start Novolog at mealtimes With this his blood sugars improved significantly and he was reportedly having good blood sugars without needing to take mealtime insulin  Recent history:   INSULIN regimen is described as:  Toujeo 80 units a day  He did not follow-up after his initial visits in 12/15 Because of his A1c being 10.3 he was referred back by his PCP  Current management, blood sugar patterns and problems identified:  He thinks his blood sugars have been significantly high for 6 months but he does not know why.  He thinks he is fairly compliant with taking his Toujeo 80 units once a day as before  He has checked his blood sugars very sporadically at home and cannot recall most of his readings, did not bring his monitor for download  He had been generally doing poorly on his diet without any planned meals and would eat whatever he could afford.  However in the last month has been generally avoiding carbohydrates   he had previously  Been taking metformin but does not know when he ran out.   He does not think he is on any new medications, his Seroquel was restarted only in December   he has irregular mealtimes   Has gained 11 pounds since his last visit He did see the  nurse educator in 12/15 for diabetes teaching       Oral hypoglycemic drugs the patient is taking are: was on Metformin      Side effects from medications have been:  Compliance with the medical regimen: Fair Hypoglycemia:     Glucose monitoring:  done one time a day         Glucometer:  Accu-Chek     Blood Glucose readings by   Recall: Mostly around 200 , does not know when his blood sugars are higher   Self-care: The diet that the patient has been following is: tries to limit starchy foods .     Meals: 2-3 meals per day. Breakfast is none or fruit. Some bacon, eggs .  Will have hamburger or ribs at meals. Larger lunch at 1 pm           Exercise at times will be active, no formal activity   CDE 12/16    Dietician visit, most recent:None.               Weight history:  Wt Readings from Last 3 Encounters:  06/12/15 210 lb 3.2 oz (95.346 kg)  06/06/14 199 lb 9.6 oz (90.538 kg)  05/16/14 208 lb 3.2 oz (94.439 kg)    Glycemic control:   Lab Results  Component Value Date  HGBA1C 14.7* 06/27/2013   HGBA1C 13.4* 03/23/2012   HGBA1C 7.3* 01/15/2010   Lab Results  Component Value Date   MICROALBUR 1.12 01/15/2010   LDLCALC 84 06/17/2009   CREATININE 0.93 11/13/2013         Medication List       This list is accurate as of: 06/12/15  4:52 PM.  Always use your most recent med list.               ACCU-CHEK AVIVA PLUS test strip  Generic drug:  glucose blood     ACCU-CHEK AVIVA PLUS w/Device Kit     BOOSTRIX 5-2.5-18.5 LF-MCG/0.5 injection  Generic drug:  Tdap     fenofibrate 160 MG tablet  Take 160 mg by mouth daily. Reported on 06/12/2015     gabapentin 300 MG capsule  Commonly known as:  NEURONTIN  Take 1 capsule (300 mg total) by mouth 3 (three) times daily.     insulin aspart 100 UNIT/ML FlexPen  Commonly known as:  NOVOLOG FLEXPEN  Inject 15 Units into the skin 3 (three) times daily with meals. Reported on 06/12/2015     Insulin Degludec 200 UNIT/ML Sopn    Commonly known as:  TRESIBA FLEXTOUCH  Inject 95 Units into the skin daily.     lisinopril 10 MG tablet  Commonly known as:  PRINIVIL,ZESTRIL  Take 10 mg by mouth daily. Reported on 06/12/2015     meloxicam 15 MG tablet  Commonly known as:  MOBIC  Take 15 mg by mouth daily. Reported on 06/12/2015     metFORMIN 500 MG 24 hr tablet  Commonly known as:  GLUCOPHAGE-XR  Take 4 tablets (2,000 mg total) by mouth daily with supper.     Oxycodone HCl 10 MG Tabs  Take 10 mg by mouth 3 (three) times daily as needed (pain). Reported on 06/12/2015     oxymorphone 10 MG tablet  Commonly known as:  OPANA  3 (three) times daily. Reported on 06/12/2015     SEROQUEL XR 150 MG 24 hr tablet  Generic drug:  QUEtiapine Fumarate  Take 150 mg by mouth at bedtime.     tiZANidine 2 MG tablet  Commonly known as:  ZANAFLEX     TOUJEO SOLOSTAR 300 UNIT/ML Sopn  Generic drug:  Insulin Glargine  INJECT 110 UNITS INTO THE SKIN EVERY MORNING BEFORE BEAKFAST        Allergies:  Allergies  Allergen Reactions  . Vancomycin Itching    Able to tolerate with diphenhydramine premedication and increased vancomycin infusion time    Past Medical History  Diagnosis Date  . HTN (hypertension) 09/25/2011  . DM (diabetes mellitus) 09/25/2011  . Degenerative joint disease 09/25/2011  . Depression 09/25/2011  . Anxiety 09/25/2011  . Chronic pain syndrome 09/25/2011  . Basal cell cancer 09/25/2011    Mid back 2007  . Melanoma 09/25/2011    Right thigh 2005  . Wears glasses     Past Surgical History  Procedure Laterality Date  . Tumor removal from right thigh    . Lymph node removal    . Incision and drainage abscess N/A 06/28/2013    Procedure: INCISION AND DRAINAGE SCROTAL ABSCESS;  Surgeon: Reece Packer, MD;  Location: [facility];  Service: Urology;  Laterality: N/A;  . Scrotal exploration N/A 06/30/2013    Procedure: SCROTUM DEBRIDEMENT;  Surgeon: [name], MD;  Location: [facility];  Service: Urology;   Laterality: N/A;  . Minor irrigation and debridement  of wound Right 08/16/2013    Procedure: MINOR DEBRIDEMENT OF RIGHT GROIN/SCROTUM ULCER WITH PLACEMENT OF ACELL/POSSIBLE TISSUE ADVANCEMENT ;  Surgeon: Theodoro Kos, DO;  Location: [place];  Service: Plastics;  Laterality: Right;    Family History  Problem Relation Age of Onset  . Aneurysm Mother   . Cancer Father   . Liver disease Neg Hx   . Diabetes Paternal Grandfather     Social History:  reports that he has been smoking Cigarettes.  He has been smoking about 0.50 packs per day. He has never used smokeless tobacco. He reports that he drinks about 0.6 oz of alcohol per week. He reports that he uses illicit drugs.    Review of Systems         Lipids: Has had variable LDL levels  Not on a statin drug  Last LDL 138 , followed by PCP       Lab Results  Component Value Date   CHOL 146 06/17/2009   HDL 41 06/17/2009   LDLCALC 84 06/17/2009   TRIG 105 06/17/2009   CHOLHDL 3.6 Ratio 06/17/2009                  The blood pressure has been Normal with lisinopril  10 mg    He has a history of Numbness, tingling and pain in feet and lower legs including at night; he has not been able to get his pain medications and not followed at pain clinic   Not clear if he has taken gabapentin for pain control    Physical Examination:  BP 140/86 mmHg  Pulse 73  Temp(Src) 97.8 F (36.6 C) (Oral)  Resp 16  Ht _0  (1.778 m)  Wt 210 lb 3.2 oz (95.346 kg)  BMI 30.16 kg/m2  SpO2 95%   ASSESSMENT:  Diabetes type 2, uncontrolled    See history of present illness for detailed discussion of his current management, blood sugar patterns and problems identified He is coming back for further about a year for reevaluation for poorly controlled diabetes   He has had much higher blood sugars in the last few months and not clear why even with the same regimen as before However he is not taking the metformin he was taking  with his basal insulin Most likely is not doing well with his diet also, has gained weight despite worsening of his glucose control  Most likely will need mealtime insulin along with basal insulin and overall higher insulin doses at least for now Discussed improving his diet He has not been motivated to check his blood sugars either and did not bring his monitor for download  PLAN:   He will continue his Toujeo insulin but increase the dose to 95 units for now, showed him how to do the higher dose with a single injection.  Discussed possibly reducing the dose when fasting blood sugars are low normal  Consider Tresiba when [employer] runs out  Start taking at least 15 units of Novolog with his meals unless he is skipping all carbohydrates  Start metformin ER and titrate the dose up to 2000 mg as directed   Discussed timing and targets of blood sugars and to bring his monitor for download on each visit  Follow-up with PCP for Hyperlipidemia  Trial of gabapentin 300 mg3 times a day for neuropathic pains, discussed the medication, benefits and side effects  Consultation with nurse educator in 2 weeks  Patient Instructions  Check blood sugars on  waking up  3  times a week Also check blood sugars about 2 hours after a meal and do this after different meals by rotation  Recommended blood sugar levels on waking up is 90-130 and about 2 hours after meal is 130-160  Please bring your blood sugar monitor to each visit, thank you  Novolog 10-15 units before each complete meal that has any starch  Toujeo 95 units in am but reduce by 10 if am sugar <100  Start taking Metformin 500 mg, 1 tablet with your main meal for 3 days. Occasionally this may initially cause loose stools or nausea.  If  tolerating well after 4 days add a second Metformin tablet (500 mg) at the same time.  Continue adding another tablet after 3 days days if no persistent nausea or diarrhea until reaching the maximum  tolerated dose or the full dose of 4 tablets once daily.         Counseling time on subjects discussed above is over 50% of today's 25 minute visit   Jane Broughton 06/12/2015, 4:52 PM   Note: This office note was prepared with Estate agent. Any transcriptional errors that result from this process are unintentional.

## 2015-06-12 NOTE — Patient Instructions (Addendum)
Check blood sugars on waking up  3  times a week Also check blood sugars about 2 hours after a meal and do this after different meals by rotation  Recommended blood sugar levels on waking up is 90-130 and about 2 hours after meal is 130-160  Please bring your blood sugar monitor to each visit, thank you  Novolog 10-15 units before each complete meal that has any starch  Toujeo 95 units in am but reduce by 10 if am sugar <100  Start taking Metformin 500 mg, 1 tablet with your main meal for 3 days. Occasionally this may initially cause loose stools or nausea.  If  tolerating well after 4 days add a second Metformin tablet (500 mg) at the same time.  Continue adding another tablet after 3 days days if no persistent nausea or diarrhea until reaching the maximum tolerated dose or the full dose of 4 tablets once daily.

## 2015-06-25 ENCOUNTER — Other Ambulatory Visit: Payer: Self-pay | Admitting: *Deleted

## 2015-06-25 MED ORDER — INSULIN LISPRO 100 UNIT/ML (KWIKPEN): PEN_INJECTOR | SUBCUTANEOUS | Status: DC

## 2015-06-26 ENCOUNTER — Encounter: Payer: 59 | Attending: Endocrinology | Admitting: Nutrition

## 2015-06-26 DIAGNOSIS — Z794 Long term (current) use of insulin: Secondary | ICD-10-CM | POA: Diagnosis not present

## 2015-06-26 DIAGNOSIS — E1165 Type 2 diabetes mellitus with hyperglycemia: Secondary | ICD-10-CM | POA: Diagnosis present

## 2015-07-05 ENCOUNTER — Other Ambulatory Visit (INDEPENDENT_AMBULATORY_CARE_PROVIDER_SITE_OTHER): Payer: 59

## 2015-07-05 DIAGNOSIS — Z794 Long term (current) use of insulin: Secondary | ICD-10-CM

## 2015-07-05 DIAGNOSIS — E1165 Type 2 diabetes mellitus with hyperglycemia: Secondary | ICD-10-CM | POA: Diagnosis not present

## 2015-07-05 LAB — BASIC METABOLIC PANEL
BUN: 25 mg/dL — ABNORMAL HIGH (ref 6–23)
CALCIUM: 9.3 mg/dL (ref 8.4–10.5)
CO2: 25 meq/L (ref 19–32)
Chloride: 104 mEq/L (ref 96–112)
Creatinine, Ser: 0.99 mg/dL (ref 0.40–1.50)
GFR: 82.55 mL/min (ref >60.00)
Glucose, Bld: 132 mg/dL — ABNORMAL HIGH (ref 70–99)
POTASSIUM: 4.2 meq/L (ref 3.5–5.1)
SODIUM: 136 meq/L (ref 135–145)

## 2015-07-06 LAB — FRUCTOSAMINE: FRUCTOSAMINE: 392 umol/L — AB (ref 0–285)

## 2015-07-10 ENCOUNTER — Ambulatory Visit (INDEPENDENT_AMBULATORY_CARE_PROVIDER_SITE_OTHER): Payer: 59 | Admitting: Endocrinology

## 2015-07-10 ENCOUNTER — Encounter: Payer: Self-pay | Admitting: Endocrinology

## 2015-07-10 VITALS — BP 140/92 | HR 82 | Temp 97.8°F | Resp 16 | Ht 70.0 in | Wt 215.4 lb

## 2015-07-10 DIAGNOSIS — Z794 Long term (current) use of insulin: Secondary | ICD-10-CM

## 2015-07-10 DIAGNOSIS — E1165 Type 2 diabetes mellitus with hyperglycemia: Secondary | ICD-10-CM | POA: Diagnosis not present

## 2015-07-10 DIAGNOSIS — I10 Essential (primary) hypertension: Secondary | ICD-10-CM | POA: Diagnosis not present

## 2015-07-10 DIAGNOSIS — E1142 Type 2 diabetes mellitus with diabetic polyneuropathy: Secondary | ICD-10-CM

## 2015-07-10 MED ORDER — LISINOPRIL 10 MG PO TABS: 10.0000 mg | ORAL_TABLET | Freq: Every day | ORAL | Status: DC

## 2015-07-10 NOTE — Patient Instructions (Addendum)
Must take 15 Humalog with lunch DAILY  5-10 units at dinner and any large snacks  Metformin 1 daily  Toujeo 90 units daily   Check blood sugars on waking up 4-5  times a week Also check blood sugars about 2 hours after a meal and do this after different meals by rotation  Recommended blood sugar levels on waking up is 90-130 and about 2 hours after meal is 130-160  Please bring your blood sugar monitor to each visit, thank you

## 2015-07-10 NOTE — Patient Instructions (Signed)
Stop all cereal, and sweet drinks Test  Blood sugar 2hr. After meals-- this reading should be less than 180.   Take Novolog before all meals.10-15 units. Call me in one week with blood sugar readings.

## 2015-07-10 NOTE — Progress Notes (Signed)
Patient did not bring meter.  Says is testing, and that they are less than 200 in the AM.  Not testing any other time. Says is taking Toujeo 80u and has not increased the dose.  Has not starting taking the Novolog.  He says that he does not want to do this, and he can not afford this.   We discussed the reasons why he needs the Novolog--to prevent the blood sugars from going high after the meal.  He did not want to test his blood sugar now after meals.   He was given a co-pay card for the Novolog and strongly encouraged to take this with at least his biggest meal, but that he really needs it with all meals. His reports trying to reduce the carbs in his meals, but can not afford "healthy foods" like veg., and lean meats.   Suggested he stop all cereal and milk, and sweet drinks, something that he is doing "on occasion", to help with his blood sugar control.   Other meals are mostly sandwiches with chips and water or diet drinks.   Patient very vague with diet history. Stressed the need to continue his oral meds, and both of his insulins.  Also suggested that the insulin is cheaper with a vial and syringe, and asked if he wanted me to show him how to do this.  He said he can do this, and did not need to learn this.   Believe overall understanding is good, but motivation and expense is keeping him from making needed changes.  He was given a bottle of 50 Aviva strips and asked to test 2 hours after supper tonight.  I also stressed that if that reading was over 180, he will need the Novolog insulin.

## 2015-07-10 NOTE — Progress Notes (Signed)
Patient ID: Charles [facility], male   DOB: [birth year], 58 y.o.   MRN: [id number]           Reason for Appointment:  Follow-up for Type 2 Diabetes  Referring physician: Finger  History of Present Illness:          Diagnosis: Type 2 diabetes mellitus         Past history:  Apparently has been taking insulin for the last 20 years or so with various regimens Initially may have taken Glucophage/metformin and does not know what other oral agents he may have tried previously On his initial consultation he was taking Levemir, has also taken Lantus Previous A1c levels had been about 9-10 but as high as 14.7 in 1/15 On his initial consultation he was switched from Levemir to [facility] and also advised to start Novolog at mealtimes With this his blood sugars improved significantly and he was reportedly having good blood sugars without needing to take mealtime insulin  Recent history:   INSULIN regimen is described as:  Toujeo 85-95 units a day, Novolog up to 15 units at meals   Current management, blood sugar patterns and problems identified:  On his last visit he was having poor control probably because of postprandial hyperglycemia, poor diet and not taking metformin or checking his sugars regularly  His Toujeo was increased from 80 up to 95 units although he is not clear if he is able to dial up the 95 units accurately consistently  Although he was told to taking Novolog with every meal he is not doing so and probably takes it his lunch periodically  Usually eating a smaller meal at suppertime but not checking readings after dinner  His monitor has a.m. and p.m. blood sugars switched and difficult to know his blood sugar patterns  Also he is checking mostly FASTING blood sugars instead of at different times  Mostly he is recently fasting readings are fairly good, as a high reading on 1/15 possibly from forgetting his insulin  He thinks some of his high readings in the evenings are related to  not watching his diet  No labs available recently to assess his overall control  Has gained 5 pounds since his last visit He did see the nurse educator in 12/15 for diabetes teaching       Oral hypoglycemic drugs the patient is taking are: ER 1000 mg daily       Side effects from medications have been:  Compliance with the medical regimen: Fair Hypoglycemia:     Glucose monitoring:  done one time a day         Glucometer:  Accu-Chek     Blood Glucose readings by   Mean values apply above for all meters except median for One Touch  PRE-MEAL Fasting Lunch Dinner Bedtime Overall  Glucose range: 81-153 196 123-303    Mean/median:     198    Self-care: The diet that the patient has been following is: tries to limit starchy foods .     Meals: 2-3 meals per day. Breakfast is none or fruit. Some bacon, eggs .  Will have hamburger or ribs at meals. Larger lunch at 1 pm           Exercise at times will be active, no formal activity, not able to because of pain   CDE 12/16123    Dietician visit, most recent:None.               Weight  history:  Wt Readings from Last 3 Encounters:  07/10/15 215 lb 6.4 oz (97.705 kg)  06/12/15 210 lb 3.2 oz (95.346 kg)  06/06/14 199 lb 9.6 oz (90.538 kg)    Glycemic control:   A1c was 10.3 in 12/16  Lab Results  Component Value Date   HGBA1C 14.7* 06/27/2013   HGBA1C 13.4* 03/23/2012   HGBA1C 7.3* 01/15/2010   Lab Results  Component Value Date   MICROALBUR 1.12 01/15/2010   LDLCALC 84 06/17/2009   CREATININE 0.99 07/05/2015         Medication List       This list is accurate as of: 07/10/15 10:02 AM.  Always use your most recent med list.               ACCU-CHEK AVIVA PLUS test strip  Generic drug:  glucose blood     ACCU-CHEK AVIVA PLUS w/Device Kit     BOOSTRIX 5-2.5-18.5 LF-MCG/0.5 injection  Generic drug:  Tdap     fenofibrate 160 MG tablet  Take 160 mg by mouth daily. Reported on 07/10/2015     gabapentin 300 MG  capsule  Commonly known as:  NEURONTIN  Take 1 capsule (300 mg total) by mouth 3 (three) times daily.     insulin lispro 100 UNIT/ML KiwkPen  Commonly known as:  HUMALOG KWIKPEN  Inject 15 units three times a day with meals.     lisinopril 10 MG tablet  Commonly known as:  PRINIVIL,ZESTRIL  Take 1 tablet (10 mg total) by mouth daily. Reported on 07/10/2015     meloxicam 15 MG tablet  Commonly known as:  MOBIC  Take 15 mg by mouth daily. Reported on 07/10/2015     metFORMIN 500 MG 24 hr tablet  Commonly known as:  GLUCOPHAGE-XR  Take 4 tablets (2,000 mg total) by mouth daily with supper.     Oxycodone HCl 10 MG Tabs  Take 10 mg by mouth 3 (three) times daily as needed (pain). Reported on 07/10/2015     oxymorphone 10 MG tablet  Commonly known as:  OPANA  3 (three) times daily. Reported on 07/10/2015     SEROQUEL XR 150 MG 24 hr tablet  Generic drug:  QUEtiapine Fumarate  Take 150 mg by mouth at bedtime. Reported on 07/10/2015     tiZANidine 2 MG tablet  Commonly known as:  ZANAFLEX     TOUJEO SOLOSTAR 300 UNIT/ML Sopn  Generic drug:  Insulin Glargine  INJECT 110 UNITS INTO THE SKIN EVERY MORNING BEFORE BEAKFAST        Allergies:  Allergies  Allergen Reactions  . Vancomycin Itching    Able to tolerate with diphenhydramine premedication and increased vancomycin infusion time    Past Medical History  Diagnosis Date  . HTN (hypertension) 09/25/2011  . DM (diabetes mellitus) ([place]) 09/25/2011  . Degenerative joint disease 09/25/2011  . Depression 09/25/2011  . Anxiety 09/25/2011  . Chronic pain syndrome 09/25/2011  . Basal cell cancer 09/25/2011    Mid back 2007  . Melanoma ([place]) 09/25/2011    Right thigh 2005  . Wears glasses     Past Surgical History  Procedure Laterality Date  . Tumor removal from right thigh    . Lymph node removal    . Incision and drainage abscess N/A 06/28/2013    Procedure: INCISION AND DRAINAGE SCROTAL ABSCESS;  Surgeon: Reece Packer, MD;   Location: [facility];  Service: Urology;  Laterality: N/A;  . Scrotal exploration N/A  06/30/2013    Procedure: SCROTUM DEBRIDEMENT;  Surgeon: [name], MD;  Location: [facility];  Service: Urology;  Laterality: N/A;  . Minor irrigation and debridement of wound Right 08/16/2013    Procedure: MINOR DEBRIDEMENT OF RIGHT GROIN/SCROTUM ULCER WITH PLACEMENT OF ACELL/POSSIBLE TISSUE ADVANCEMENT ;  Surgeon: Theodoro Kos, DO;  Location: [facility];  Service: Plastics;  Laterality: Right;    Family History  Problem Relation Age of Onset  . Aneurysm Mother   . Cancer Father   . Liver disease Neg Hx   . Diabetes Paternal Grandfather     Social History:  reports that he has been smoking Cigarettes.  He has been smoking about 0.50 packs per day. He has never used smokeless tobacco. He reports that he drinks about 0.6 oz of alcohol per week. He reports that he uses illicit drugs.    Review of Systems         Lipids: Has had variable LDL levels  Not on a statin drug  Last LDL 138 , followed by PCP       Lab Results  Component Value Date   CHOL 146 06/17/2009   HDL 41 06/17/2009   LDLCALC 84 06/17/2009   TRIG 105 06/17/2009   CHOLHDL 3.6 Ratio 06/17/2009                  The blood pressure has been previously treated with lisinopril but he is not taking this Blood pressure is high today    He has a history of Numbness, tingling and pain in feet and lower legs including at night; he has not been able to get his pain medications and not followed at pain clinic He does not think gabapentin is helping which was started last month He does not want to go back to the pain clinic and has not discussed alternatives with PCP    Physical Examination:  BP 140/92 mmHg  Pulse 82  Temp(Src) 97.8 F (36.6 C)  Resp 16  Ht '5\' 10"'  (1.778 m)  Wt 215 lb 6.4 oz (97.705 kg)  BMI 30.91 kg/m2  SpO2 97%   ASSESSMENT/PLAN:  Diabetes type 2, uncontrolled    See history of present  illness for detailed discussion of his current management, blood sugar patterns and problems identified  His blood sugar control is still erratic especially after meals Although he has had some diabetes education with nurse educator is still not checking his blood sugars as directed after meals and mostly in the morning Fasting blood sugars appear to be better with increasing his Toujeo to 95 units Not able to get [place] on his insurance  Explained to the patient that he has postprandial hyperglycemia and the need for mealtime insulin regardless of blood sugar in the morning He will take at least 15 units for his main meal at lunch and larger amounts if he is going off his diet occasionally May need at least in units at suppertime consistently also but this will be determined by his postprandial readings Since he is having some diarrhea can reduce his metformin to 500 mg for now  Will recheck his A1c on the next visit  HYPERTENSION: We will restart his lisinopril 10 mg daily  Pain management: Advised him to follow-up with PCP for options  Patient Instructions  Must take 15 Humalog with lunch DAILY  5-10 units at dinner and any large snacks  Metformin 1 daily  Toujeo 90 units daily   Check blood sugars  on waking up 4-5  times a week Also check blood sugars about 2 hours after a meal and do this after different meals by rotation  Recommended blood sugar levels on waking up is 90-130 and about 2 hours after meal is 130-160  Please bring your blood sugar monitor to each visit, thank you        Counseling time on subjects discussed above is over 50% of today's 25 minute visit   Rocsi Hazelbaker 07/10/2015, 10:02 AM   Note: This office note was prepared with Estate agent. Any transcriptional errors that result from this process are unintentional.

## 2015-07-26 ENCOUNTER — Other Ambulatory Visit: Payer: Self-pay | Admitting: Endocrinology

## 2015-09-04 ENCOUNTER — Other Ambulatory Visit (INDEPENDENT_AMBULATORY_CARE_PROVIDER_SITE_OTHER): Payer: Medicare Other

## 2015-09-04 DIAGNOSIS — Z794 Long term (current) use of insulin: Secondary | ICD-10-CM | POA: Diagnosis not present

## 2015-09-04 DIAGNOSIS — E1165 Type 2 diabetes mellitus with hyperglycemia: Secondary | ICD-10-CM | POA: Diagnosis not present

## 2015-09-04 LAB — BASIC METABOLIC PANEL
BUN: 29 mg/dL — ABNORMAL HIGH (ref 6–23)
CALCIUM: 10.6 mg/dL — AB (ref 8.4–10.5)
CO2: 26 meq/L (ref 19–32)
CREATININE: 1.09 mg/dL (ref 0.40–1.50)
Chloride: 108 mEq/L (ref 96–112)
GFR: 73.83 mL/min (ref >60.00)
GLUCOSE: 162 mg/dL — AB (ref 70–99)
Potassium: 4.4 mEq/L (ref 3.5–5.1)
SODIUM: 140 meq/L (ref 135–145)

## 2015-09-04 LAB — MICROALBUMIN / CREATININE URINE RATIO
Creatinine,U: 211.8 mg/dL
MICROALB/CREAT RATIO: 2.8 mg/g (ref 0.0–30.0)
Microalb, Ur: 6 mg/dL — ABNORMAL HIGH (ref 0.0–1.9)

## 2015-09-04 LAB — HEMOGLOBIN A1C: Hgb A1c MFr Bld: 7.7 % — ABNORMAL HIGH (ref 4.6–6.5)

## 2015-09-06 ENCOUNTER — Encounter: Payer: Self-pay | Admitting: Endocrinology

## 2015-09-06 ENCOUNTER — Ambulatory Visit (INDEPENDENT_AMBULATORY_CARE_PROVIDER_SITE_OTHER): Payer: Medicare Other | Admitting: Endocrinology

## 2015-09-06 VITALS — BP 130/84 | HR 76 | Temp 98.5°F | Resp 16 | Ht 70.0 in | Wt 217.2 lb

## 2015-09-06 DIAGNOSIS — Z794 Long term (current) use of insulin: Secondary | ICD-10-CM

## 2015-09-06 DIAGNOSIS — E1165 Type 2 diabetes mellitus with hyperglycemia: Secondary | ICD-10-CM | POA: Diagnosis not present

## 2015-09-06 MED ORDER — ATORVASTATIN CALCIUM 10 MG PO TABS: 10.0000 mg | ORAL_TABLET | Freq: Every day | ORAL | Status: DC

## 2015-09-06 NOTE — Patient Instructions (Addendum)
Toujeo 90 daily  Take 10 units per meal , less for lower carbs or smaller meal  Take at least 5 Humalog at each meal regardless of sugar level

## 2015-09-06 NOTE — Progress Notes (Signed)
Patient ID: Charles Manning, male   DOB: 11/27/1955, 60 y.o.   MRN: [id number]           Reason for Appointment:  Follow-up for Type 2 Diabetes  Referring physician: Finger  History of Present Illness:          Diagnosis: Type 2 diabetes mellitus         Past history:  Apparently has been taking insulin for the last 20 years or so with various regimens Initially may have taken Glucophage/metformin and does not know what other oral agents he may have tried previously On his initial consultation he was taking Levemir, has also taken Lantus Previous A1c levels had been about 9-10 but as high as 14.7 in 1/15 On his initial consultation he was switched from Levemir to [facility] and also advised to start Novolog at mealtimes With this his blood sugars improved significantly and he was reportedly having good blood sugars without needing to take mealtime insulin  Recent history:   INSULIN regimen is described as:  Toujeo 95 units a day, Novolog up to 15 units at meals   Current management, blood sugar patterns and problems identified:  On his last visit he was advised to check his blood sugars more regularly and take Novolog consistently at meals based on meal size  More recently is not checking blood sugars much at all  Although A1c is 7.7 not clear what his blood sugar patterns are as he hasn't not done any blood sugars after meals  Has gained another couple of pounds, not very active recently  Although he was told to taking Novolog with every meal he is not doing so and if his blood sugar is normal Humalog take any  Not adjusting his Novolog based on his meal size He did see the nurse educator in 12/15 for diabetes teaching       Oral hypoglycemic drugs the patient is taking are: Metformin ER 1000 mg daily        Compliance with the medical regimen: Fair to poor  Hypoglycemia:  minimal, has 1 low normal glucose late afternoon     Glucose monitoring:  done one time a day          Glucometer:  Accu-Chek     Blood Glucose readings by download show only 5 readings recently  Mean values apply above for all meters except median for One Touch  PRE-MEAL Fasting Lunch Dinner Bedtime Overall  Glucose range: 89-122   65, 149     Mean/median:        Self-care: The diet that the patient has been following is: tries to limit starchy foods .     Meals: 2-3 meals per day. Breakfast is none or fruit. Some bacon, eggs .  Will have hamburger or ribs at meals.  Larger lunch at 1 pm           Exercise at times will be active, no formal Exercise  CDE 12/16123    Dietician visit, most recent:None.               Weight history:  Wt Readings from Last 3 Encounters:  09/06/15 217 lb 3.2 oz (98.521 kg)  07/10/15 215 lb 6.4 oz (97.705 kg)  06/12/15 210 lb 3.2 oz (95.346 kg)    Glycemic control:   A1c was 10.3 in 12/16  Lab Results  Component Value Date   HGBA1C 7.7* 09/04/2015   HGBA1C 14.7* 06/27/2013   HGBA1C 13.4* 03/23/2012  Lab Results  Component Value Date   MICROALBUR 6.0* 09/04/2015   LDLCALC 84 06/17/2009   CREATININE 1.09 09/04/2015         Medication List       This list is accurate as of: 09/06/15 11:59 PM.  Always use your most recent med list.               ACCU-CHEK AVIVA PLUS test strip  Generic drug:  glucose blood     ACCU-CHEK AVIVA PLUS w/Device Kit     atorvastatin 10 MG tablet  Commonly known as:  LIPITOR  Take 1 tablet (10 mg total) by mouth daily.     BOOSTRIX 5-2.5-18.5 LF-MCG/0.5 injection  Generic drug:  Tdap     gabapentin 300 MG capsule  Commonly known as:  NEURONTIN  Take 1 capsule (300 mg total) by mouth 3 (three) times daily.     ibuprofen 800 MG tablet  Commonly known as:  ADVIL,MOTRIN     insulin lispro 100 UNIT/ML KiwkPen  Commonly known as:  HUMALOG KWIKPEN  Inject 15 units three times a day with meals.     lisinopril 10 MG tablet  Commonly known as:  PRINIVIL,ZESTRIL  Take 1 tablet (10 mg total) by  mouth daily. Reported on 07/10/2015     meloxicam 15 MG tablet  Commonly known as:  MOBIC  Take 15 mg by mouth daily. Reported on 09/06/2015     metFORMIN 500 MG 24 hr tablet  Commonly known as:  GLUCOPHAGE-XR  Take 4 tablets (2,000 mg total) by mouth daily with supper.     Oxycodone HCl 10 MG Tabs  Take 10 mg by mouth 3 (three) times daily as needed (pain). Reported on 09/06/2015     oxymorphone 10 MG tablet  Commonly known as:  OPANA  3 (three) times daily. Reported on 09/06/2015     SEROQUEL XR 150 MG 24 hr tablet  Generic drug:  QUEtiapine Fumarate  Take 150 mg by mouth at bedtime. Reported on 09/06/2015     tiZANidine 2 MG tablet  Commonly known as:  ZANAFLEX     TOUJEO SOLOSTAR 300 UNIT/ML Sopn  Generic drug:  Insulin Glargine  INJECT 110 UNITS INTO THE SKIN EVERY MORNING BEFORE BREAKFAST        Allergies:  Allergies  Allergen Reactions  . Vancomycin Itching    Able to tolerate with diphenhydramine premedication and increased vancomycin infusion time    Past Medical History  Diagnosis Date  . HTN (hypertension) 09/25/2011  . DM (diabetes mellitus) ([place]) 09/25/2011  . Degenerative joint disease 09/25/2011  . Depression 09/25/2011  . Anxiety 09/25/2011  . Chronic pain syndrome 09/25/2011  . Basal cell cancer 09/25/2011    Mid back 2007  . Melanoma ([place]) 09/25/2011    Right thigh 2005  . Wears glasses     Past Surgical History  Procedure Laterality Date  . Tumor removal from right thigh    . Lymph node removal    . Incision and drainage abscess N/A 06/28/2013    Procedure: INCISION AND DRAINAGE SCROTAL ABSCESS;  Surgeon: Reece Packer, MD;  Location: [facility];  Service: Urology;  Laterality: N/A;  . Scrotal exploration N/A 06/30/2013    Procedure: SCROTUM DEBRIDEMENT;  Surgeon: [name], MD;  Location: [facility];  Service: Urology;  Laterality: N/A;  . Minor irrigation and debridement of wound Right 08/16/2013    Procedure: MINOR DEBRIDEMENT OF RIGHT  GROIN/SCROTUM ULCER WITH PLACEMENT OF ACELL/POSSIBLE TISSUE  ADVANCEMENT ;  Surgeon: Theodoro Kos, DO;  Location: [place];  Service: Plastics;  Laterality: Right;    Family History  Problem Relation Age of Onset  . Aneurysm Mother   . Cancer Father   . Liver disease Neg Hx   . Diabetes Paternal Grandfather     Social History:  reports that he has been smoking Cigarettes.  He has been smoking about 0.50 packs per day. He has never used smokeless tobacco. He reports that he drinks about 0.6 oz of alcohol per week. He reports that he uses illicit drugs.    Review of Systems         Lipids: Has had variable LDL levels  Not on a statin drug  Last LDL 138 , followed by PCP but currently only on fenofibrate  triglycerides recently 128      Lab Results  Component Value Date   CHOL 146 06/17/2009   HDL 41 06/17/2009   LDLCALC 84 06/17/2009   TRIG 105 06/17/2009   CHOLHDL 3.6 Ratio 06/17/2009                  The blood pressure has been treated with lisinopril And blood pressure is improved since last visit     He has a history of Numbness, tingling and pain in feet and lower legs including at night; he has not been able to get his pain medications and not followed at pain clinic He does not want to go back to the pain clinic and has not discussed alternatives with PCP    Physical Examination:  BP 130/84 mmHg  Pulse 76  Temp(Src) 98.5 F (36.9 C)  Resp 16  Ht '5\' 10"'  (1.778 m)  Wt 217 lb 3.2 oz (98.521 kg)  BMI 31.16 kg/m2  SpO2 96%   ASSESSMENT/PLAN:  Diabetes type 2, uncontrolled    See history of present illness for detailed discussion of his current management, blood sugar patterns and problems identified  Currently mostly on a basal insulin along with low dose metformin  His blood sugar control is still erratic although A1c is better than usual at 7.7 Difficult to assess his insulin requirement as he is checking blood sugars very sporadically He  does appear to have usually good readings before breakfast and a couple of good readings before supper He does not understand the need to take Novolog cover all his meals regardless of the meal blood sugar   Discussed need to check blood sugars consistently, discussed timing of glucose monitoring, blood sugar targets Encouraged him to be more active Take 10 units for all meals and increase up or down 5 units based on meal size Since he has occasional low normal blood sugars before meals he can reduce his Toujeo to 90 and take this consistently at the same dose, discussed adjusting this only if fasting blood sugars are trending unusually high or low   HYPERLIPIDEMIA: Discussed earlier targets, most likely does not need to fenofibrate which has been taking and which has less evidence in cardiovascular risk reduction; will have him start Lipitor 10 mg daily   HYPERTENSION: We will continue his lisinopril 10 mg daily   Patient Instructions  Toujeo 90 daily  Take 10 units per meal , less for lower carbs or smaller meal  Take at least 5 Humalog at each meal regardless of sugar level    Counseling time on subjects discussed above is over 50% of today's 25 minute visit   Jesalyn Finazzo 09/08/2015,  3:21 PM   Note: This office note was prepared with Estate agent. Any transcriptional errors that result from this process are unintentional.

## 2015-11-14 ENCOUNTER — Other Ambulatory Visit: Payer: Self-pay | Admitting: Endocrinology

## 2015-12-06 ENCOUNTER — Other Ambulatory Visit (INDEPENDENT_AMBULATORY_CARE_PROVIDER_SITE_OTHER): Payer: Medicare Other

## 2015-12-06 DIAGNOSIS — Z794 Long term (current) use of insulin: Secondary | ICD-10-CM

## 2015-12-06 DIAGNOSIS — E1165 Type 2 diabetes mellitus with hyperglycemia: Secondary | ICD-10-CM

## 2015-12-06 LAB — COMPREHENSIVE METABOLIC PANEL
ALK PHOS: 60 U/L (ref 39–117)
ALT: 14 U/L (ref 0–53)
AST: 15 U/L (ref 0–37)
Albumin: 4 g/dL (ref 3.5–5.2)
BILIRUBIN TOTAL: 0.3 mg/dL (ref 0.2–1.2)
BUN: 23 mg/dL (ref 6–23)
CALCIUM: 10.1 mg/dL (ref 8.4–10.5)
CO2: 22 meq/L (ref 19–32)
CREATININE: 0.86 mg/dL (ref 0.40–1.50)
Chloride: 110 mEq/L (ref 96–112)
GFR: 96.97 mL/min (ref >60.00)
Glucose, Bld: 134 mg/dL — ABNORMAL HIGH (ref 70–99)
Potassium: 4.5 mEq/L (ref 3.5–5.1)
Sodium: 138 mEq/L (ref 135–145)
TOTAL PROTEIN: 7.4 g/dL (ref 6.0–8.3)

## 2015-12-06 LAB — LIPID PANEL
CHOL/HDL RATIO: 3
Cholesterol: 112 mg/dL (ref 0–200)
HDL: 33.8 mg/dL — ABNORMAL LOW (ref >39.00)
LDL Cholesterol: 66 mg/dL (ref 0–99)
NONHDL: 78.44
TRIGLYCERIDES: 64 mg/dL (ref 0.0–149.0)
VLDL: 12.8 mg/dL (ref 0.0–40.0)

## 2015-12-06 LAB — HEMOGLOBIN A1C: Hgb A1c MFr Bld: 7.9 % — ABNORMAL HIGH (ref 4.6–6.5)

## 2015-12-06 LAB — MICROALBUMIN / CREATININE URINE RATIO
Creatinine,U: 293.3 mg/dL
Microalb Creat Ratio: 1 mg/g (ref 0.0–30.0)
Microalb, Ur: 2.9 mg/dL — ABNORMAL HIGH (ref 0.0–1.9)

## 2015-12-11 ENCOUNTER — Ambulatory Visit (INDEPENDENT_AMBULATORY_CARE_PROVIDER_SITE_OTHER): Payer: Medicare Other | Admitting: Endocrinology

## 2015-12-11 ENCOUNTER — Encounter: Payer: Self-pay | Admitting: Endocrinology

## 2015-12-11 VITALS — BP 130/82 | HR 60 | Temp 98.0°F | Ht 70.0 in | Wt 208.5 lb

## 2015-12-11 DIAGNOSIS — F329 Major depressive disorder, single episode, unspecified: Secondary | ICD-10-CM

## 2015-12-11 DIAGNOSIS — E1142 Type 2 diabetes mellitus with diabetic polyneuropathy: Secondary | ICD-10-CM

## 2015-12-11 DIAGNOSIS — I10 Essential (primary) hypertension: Secondary | ICD-10-CM

## 2015-12-11 DIAGNOSIS — E1165 Type 2 diabetes mellitus with hyperglycemia: Secondary | ICD-10-CM

## 2015-12-11 DIAGNOSIS — Z794 Long term (current) use of insulin: Secondary | ICD-10-CM

## 2015-12-11 DIAGNOSIS — F32A Depression, unspecified: Secondary | ICD-10-CM

## 2015-12-11 NOTE — Patient Instructions (Addendum)
Reduce toujeo to 90  Check blood sugars on waking up 3-4  times a week Also check blood sugars about 2 hours after a meal and do this after different meals by rotation  Recommended blood sugar levels on waking up is 90-130 and about 2 hours after meal is 130-160  Please bring your blood sugar monitor to each visit, thank you

## 2015-12-11 NOTE — Progress Notes (Signed)
Pre visit review using our clinic review tool, if applicable. No additional management support is needed unless otherwise documented below in the visit note. 

## 2015-12-11 NOTE — Progress Notes (Signed)
Patient ID: Charles Manning, male   DOB: May 29, 1956, 60 y.o.   MRN: [id number]           Reason for Appointment:  Follow-up for Type 2 Diabetes  History of Present Illness:          Diagnosis: Type 2 diabetes mellitus         Past history:  Apparently has been taking insulin for the last 20 years or so with various regimens Initially may have taken Glucophage/metformin and does not know what other oral agents he may have tried previously On his initial consultation he was taking Levemir, has also taken Lantus Previous A1c levels had been about 9-10 but as high as 14.7 in 1/15 On his initial consultation he was switched from Levemir to [facility] and also advised to start Novolog at mealtimes With this his blood sugars improved significantly and he was reportedly having good blood sugars without needing to take mealtime insulin  Recent history:   INSULIN regimen is described as:  Toujeo 95 units a day, Novolog 0- 15 units at meals   Current management, blood sugar patterns and problems identified:  He continues to have an adequate control with A1c 7.9  He has done some more glucose monitoring than before but mostly in the mornings  He thinks he feels a little hypoglycemic when his blood sugars are closer 70 which has occurred daily in the last 3 days but at various times  He thinks some of his high readings in the morning may be from forgetting his Toujeo  Also taking Novolog very inconsistently and generally only once a day because of forgetting it  He is not motivated to take good care of his diabetes  Diet is quite variable  Not adjusting his Novolog based on his meal size He did see the nurse educator in 1/17 for diabetes teaching but has not improved his control       Oral hypoglycemic drugs the patient is taking are: Metformin ER 1000 mg daily        Compliance with the medical regimen: Fair to poor  Hypoglycemia:  minimal, feels symptomatic when blood sugars are around  70  Glucose monitoring:  done one time a day         Glucometer:  Accu-Chek     Blood Glucose readings by download show  Mean values apply above for all meters except median for One Touch  PRE-MEAL Mornings  Lunch Dinner 7-10 PM  Overall  Glucose range: 70-353  95, 211  72-221  71-229    Mean/median: 183   159   171   Self-care: The diet that the patient has been following is: tries to limit starchy foods .     Meals: 2-3 meals per day. Breakfast is none or fruit. Some bacon, eggs .  Will have hamburger or ribs at meals.  Larger lunch at 1 pm           Exercise at times will be active, no formal Exercise  CDE 12/16123    Dietician visit, most recent:None.               Weight history:  Wt Readings from Last 3 Encounters:  12/11/15 208 lb 8 oz (94.575 kg)  09/06/15 217 lb 3.2 oz (98.521 kg)  07/10/15 215 lb 6.4 oz (97.705 kg)    Glycemic control:   A1c was 10.3 in 12/16  Lab Results  Component Value Date   HGBA1C 7.9* 12/06/2015  HGBA1C 7.7* 09/04/2015   HGBA1C 14.7* 06/27/2013   Lab Results  Component Value Date   MICROALBUR 2.9* 12/06/2015   LDLCALC 66 12/06/2015   CREATININE 0.86 12/06/2015         Medication List       This list is accurate as of: 12/11/15  3:01 PM.  Always use your most recent med list.               ACCU-CHEK AVIVA PLUS test strip  Generic drug:  glucose blood     ACCU-CHEK AVIVA PLUS w/Device Kit     atorvastatin 10 MG tablet  Commonly known as:  LIPITOR  Take 1 tablet (10 mg total) by mouth daily.     BOOSTRIX 5-2.5-18.5 LF-MCG/0.5 injection  Generic drug:  Tdap     gabapentin 300 MG capsule  Commonly known as:  NEURONTIN  Take 1 capsule (300 mg total) by mouth 3 (three) times daily.     ibuprofen 800 MG tablet  Commonly known as:  ADVIL,MOTRIN     insulin lispro 100 UNIT/ML KiwkPen  Commonly known as:  HUMALOG KWIKPEN  Inject 15 units three times a day with meals.     lisinopril 10 MG tablet  Commonly known as:   PRINIVIL,ZESTRIL  TAKE 1 TABLET BY MOUTH EVERY DAY     meloxicam 15 MG tablet  Commonly known as:  MOBIC  Take 15 mg by mouth daily. Reported on 09/06/2015     metFORMIN 500 MG 24 hr tablet  Commonly known as:  GLUCOPHAGE-XR  Take 4 tablets (2,000 mg total) by mouth daily with supper.     Oxycodone HCl 10 MG Tabs  Take 10 mg by mouth 3 (three) times daily as needed (pain). Reported on 09/06/2015     oxymorphone 10 MG tablet  Commonly known as:  OPANA  3 (three) times daily. Reported on 09/06/2015     SEROQUEL XR 150 MG 24 hr tablet  Generic drug:  QUEtiapine Fumarate  Take 150 mg by mouth at bedtime. Reported on 12/11/2015     tiZANidine 2 MG tablet  Commonly known as:  ZANAFLEX     TOUJEO SOLOSTAR 300 UNIT/ML Sopn  Generic drug:  Insulin Glargine  INJECT 110 UNITS INTO THE SKIN EVERY MORNING BEFORE BREAKFAST        Allergies:  Allergies  Allergen Reactions  . Vancomycin Itching    Able to tolerate with diphenhydramine premedication and increased vancomycin infusion time    Past Medical History  Diagnosis Date  . HTN (hypertension) 09/25/2011  . DM (diabetes mellitus) ([place]) 09/25/2011  . Degenerative joint disease 09/25/2011  . Depression 09/25/2011  . Anxiety 09/25/2011  . Chronic pain syndrome 09/25/2011  . Basal cell cancer 09/25/2011    Mid back 2007  . Melanoma ([place]) 09/25/2011    Right thigh 2005  . Wears glasses     Past Surgical History  Procedure Laterality Date  . Tumor removal from right thigh    . Lymph node removal    . Incision and drainage abscess N/A 06/28/2013    Procedure: INCISION AND DRAINAGE SCROTAL ABSCESS;  Surgeon: Reece Packer, MD;  Location: [facility];  Service: Urology;  Laterality: N/A;  . Scrotal exploration N/A 06/30/2013    Procedure: SCROTUM DEBRIDEMENT;  Surgeon: [name], MD;  Location: [facility];  Service: Urology;  Laterality: N/A;  . Minor irrigation and debridement of wound Right 08/16/2013    Procedure: MINOR  DEBRIDEMENT OF RIGHT GROIN/SCROTUM  ULCER WITH PLACEMENT OF ACELL/POSSIBLE TISSUE ADVANCEMENT ;  Surgeon: Theodoro Kos, DO;  Location: [place];  Service: Plastics;  Laterality: Right;    Family History  Problem Relation Age of Onset  . Aneurysm Mother   . Cancer Father   . Liver disease Neg Hx   . Diabetes Paternal Grandfather     Social History:  reports that he has been smoking Cigarettes.  He has been smoking about 0.50 packs per day. He has never used smokeless tobacco. He reports that he drinks about 0.6 oz of alcohol per week. He reports that he uses illicit drugs.    Review of Systems    He has not taken his Seroquel. He has not discussed with his PCP.  He thinks it makes him gain weight but is complaining about depression, somnolence, lack of motivation and pain  Still takes gabapentin      Lipids: Has had variable LDL levels , now taking Lipitor and LDL is below 100  Also followed by PCP   Triglycerides are excellent      Lab Results  Component Value Date   CHOL 112 12/06/2015   HDL 33.80* 12/06/2015   LDLCALC 66 12/06/2015   TRIG 64.0 12/06/2015   CHOLHDL 3 12/06/2015                  The blood pressure has been treated with lisinopril And blood pressure is  Control     He has a history of Numbness, tingling and pain in feet and lower legs including at night; he has not been able to get his pain medications and not followed at pain clinic He does not want to go back to the pain clinic and has not discussed alternatives with PCP    Physical Examination:  BP 130/82 mmHg  Pulse 60  Temp(Src) 98 F (36.7 C) (Oral)  Ht '5\' 10"'  (1.778 m)  Wt 208 lb 8 oz (94.575 kg)  BMI 29.92 kg/m2  SpO2 98%   ASSESSMENT/PLAN:  Diabetes type 2, uncontrolled    See history of present illness for detailed discussion of his current management, blood sugar patterns and problems identified   blood sugars are not controlled on basal  Bolus insulin along with low  dose metformin  His blood sugar Patterns are consistent because of difficulty with his compliance with diet, timing of insulin an taking insulin regularly  He was asked to keep a record of his food intake and blood sugars before and after eating but he does not think can comply with this exercise  He does need more motivation to improve his control   Since he sometimes has low normal blood sugars fasting to reduce his Toujeo down to 90 units  Tyler Aas is not covered by his insurance    Have discussed needing to take Novolog consistently with all meals that have carbohydrate  He will go back to the nurse educator for help with his compliance and meal planning   DEPRESSION: He is not taking any medication and needs to discuss with PCP   Patient Instructions  Reduce toujeo to 90  Check blood sugars on waking up 3-4  times a week Also check blood sugars about 2 hours after a meal and do this after different meals by rotation  Recommended blood sugar levels on waking up is 90-130 and about 2 hours after meal is 130-160  Please bring your blood sugar monitor to each visit, thank you  Counseling time on subjects discussed above is over 50% of today's 25 minute visit   Ulas Zuercher 12/11/2015, 3:01 PM   Note: This office note was prepared with Estate agent. Any transcriptional errors that result from this process are unintentional.

## 2015-12-17 ENCOUNTER — Other Ambulatory Visit: Payer: Self-pay | Admitting: Endocrinology

## 2016-01-05 ENCOUNTER — Other Ambulatory Visit: Payer: Self-pay | Admitting: Endocrinology

## 2016-01-09 ENCOUNTER — Other Ambulatory Visit: Payer: Self-pay | Admitting: Endocrinology

## 2016-01-10 ENCOUNTER — Other Ambulatory Visit: Payer: Self-pay

## 2016-01-10 MED ORDER — INSULIN GLARGINE 300 UNIT/ML ~~LOC~~ SOPN: 110.0000 [IU] | PEN_INJECTOR | Freq: Every day | SUBCUTANEOUS | 2 refills | Status: DC

## 2016-01-13 ENCOUNTER — Encounter: Payer: Medicare Other | Admitting: Nutrition

## 2016-02-03 ENCOUNTER — Other Ambulatory Visit: Payer: Self-pay | Admitting: Endocrinology

## 2016-03-10 ENCOUNTER — Other Ambulatory Visit (INDEPENDENT_AMBULATORY_CARE_PROVIDER_SITE_OTHER): Payer: Medicare Other

## 2016-03-10 DIAGNOSIS — E1165 Type 2 diabetes mellitus with hyperglycemia: Secondary | ICD-10-CM | POA: Diagnosis not present

## 2016-03-10 DIAGNOSIS — Z794 Long term (current) use of insulin: Secondary | ICD-10-CM | POA: Diagnosis not present

## 2016-03-10 LAB — BASIC METABOLIC PANEL
BUN: 18 mg/dL (ref 6–23)
CO2: 24 meq/L (ref 19–32)
Calcium: 9.1 mg/dL (ref 8.4–10.5)
Chloride: 108 mEq/L (ref 96–112)
Creatinine, Ser: 0.86 mg/dL (ref 0.40–1.50)
GFR: 96.88 mL/min (ref >60.00)
GLUCOSE: 162 mg/dL — AB (ref 70–99)
POTASSIUM: 4.4 meq/L (ref 3.5–5.1)
SODIUM: 138 meq/L (ref 135–145)

## 2016-03-10 LAB — HEMOGLOBIN A1C: Hgb A1c MFr Bld: 7 % — ABNORMAL HIGH (ref 4.6–6.5)

## 2016-03-13 ENCOUNTER — Encounter: Payer: Self-pay | Admitting: Endocrinology

## 2016-03-13 ENCOUNTER — Ambulatory Visit (INDEPENDENT_AMBULATORY_CARE_PROVIDER_SITE_OTHER): Payer: Medicare Other | Admitting: Endocrinology

## 2016-03-13 VITALS — BP 127/75 | HR 66 | Temp 98.2°F | Resp 16 | Ht 70.0 in | Wt 209.4 lb

## 2016-03-13 DIAGNOSIS — Z794 Long term (current) use of insulin: Secondary | ICD-10-CM

## 2016-03-13 DIAGNOSIS — E1165 Type 2 diabetes mellitus with hyperglycemia: Secondary | ICD-10-CM | POA: Diagnosis not present

## 2016-03-13 DIAGNOSIS — Z23 Encounter for immunization: Secondary | ICD-10-CM | POA: Diagnosis not present

## 2016-03-13 NOTE — Progress Notes (Signed)
Patient ID: Charles Manning, male   DOB: [birth year], 58 y.o.   MRN: [id number]           Reason for Appointment:  Follow-up for Type 2 Diabetes  History of Present Illness:          Diagnosis: Type 2 diabetes mellitus         Past history:  Apparently has been taking insulin for the last 20 years or so with various regimens Initially may have taken Glucophage/metformin and does not know what other oral agents he may have tried previously On his initial consultation he was taking Levemir, has also taken Lantus Previous A1c levels had been about 9-10 but as high as 14.7 in 1/15 On his initial consultation he was switched from Levemir to [facility] and also advised to start Novolog at mealtimes With this his blood sugars improved significantly   Recent history:   INSULIN regimen is described as:  Toujeo 90 units q pm, Novolog 0- 15 units at meals   His A1c is significantly better at 7%, previously 7.9  Current management, blood sugar patterns and problems identified:  He has done relatively better with his diet and he thinks that he has been more motivated to cut out sweets and snacks and trying to eat some protein instead of junk food  He is higher sporadically not watching his diet and eating higher fat meals and not covering these with insulin  He also is concerned about taking mealtime insulin if his blood sugar in the morning is near normal  Does have some low normal readings in the morning recently with blood sugars as low as 66  Overnight hypoglycemia reported  He is still adjusting his Toujeo somewhat based on his blood sugar in the evening  Does not sometimes take  NovoLog when he is going out to work  Is a little more active with working, not able to walk much  Not adjusting his Novolog based on his meal size He did see the nurse educator in 1/17 for diabetes teaching but has not improved his control       Oral hypoglycemic drugs the patient is taking are: Metformin ER  1000 mg daily        Compliance with the medical regimen: Fair to poor  Hypoglycemia:  minimal, feels symptomatic when blood sugars are around 70  Glucose monitoring:  done up to 2 time a day         Glucometer:  Accu-Chek     Blood Glucose readings by download show  Mean values apply above for all meters except median for One Touch  PRE-MEAL Fasting Lunch 6-9 PM  Bedtime Overall  Glucose range: 68-200 100-227  77-211   89-230 66-230   Mean/median: 101   170   126   Self-care: The diet that the patient has been following is: tries to limit starchy foods .     Meals: 2-3 meals per day. Breakfast is none or fruit. Some bacon, eggs .  Will have hamburger or ribs at meals.  Larger lunch at 1 pm            Exercise  active, working  CDE 12/16     Dietician visit, most recent:None.               Weight history:  Wt Readings from Last 3 Encounters:  03/13/16 209 lb 6.4 oz (95 kg)  12/11/15 208 lb 8 oz (94.6 kg)  09/06/15 217 lb 3.2  oz (98.5 kg)    Glycemic control:   A1c was 10.3 in 12/16  Lab Results  Component Value Date   HGBA1C 7.0 (H) 03/10/2016   HGBA1C 7.9 (H) 12/06/2015   HGBA1C 7.7 (H) 09/04/2015   Lab Results  Component Value Date   MICROALBUR 2.9 (H) 12/06/2015   LDLCALC 66 12/06/2015   CREATININE 0.86 03/10/2016         Medication List       Accurate as of 03/13/16 12:48 PM. Always use your most recent med list.          ACCU-CHEK AVIVA PLUS test strip Generic drug:  glucose blood   ACCU-CHEK AVIVA PLUS w/Device Kit   atorvastatin 10 MG tablet Commonly known as:  LIPITOR Take 1 tablet (10 mg total) by mouth daily.   BOOSTRIX 5-2.5-18.5 LF-MCG/0.5 injection Generic drug:  Tdap   gabapentin 300 MG capsule Commonly known as:  NEURONTIN TAKE 1 CAPSULE(300 MG) BY MOUTH THREE TIMES DAILY   ibuprofen 800 MG tablet Commonly known as:  ADVIL,MOTRIN   Insulin Glargine 300 UNIT/ML Sopn Commonly known as:  TOUJEO SOLOSTAR Inject 110 Units into  the skin daily.   insulin lispro 100 UNIT/ML KiwkPen Commonly known as:  HUMALOG KWIKPEN Inject 15 units three times a day with meals.   lisinopril 10 MG tablet Commonly known as:  PRINIVIL,ZESTRIL TAKE 1 TABLET BY MOUTH EVERY DAY   meloxicam 15 MG tablet Commonly known as:  MOBIC Take 15 mg by mouth daily. Reported on 09/06/2015   metFORMIN 500 MG 24 hr tablet Commonly known as:  GLUCOPHAGE-XR TAKE 4 TABLETS(2000 MG) BY MOUTH DAILY WITH DINNER   SEROQUEL XR 150 MG 24 hr tablet Generic drug:  QUEtiapine Fumarate Take 150 mg by mouth at bedtime. Reported on 12/11/2015   tiZANidine 2 MG tablet Commonly known as:  ZANAFLEX       Allergies:  Allergies  Allergen Reactions  . Vancomycin Itching    Able to tolerate with diphenhydramine premedication and increased vancomycin infusion time    Past Medical History:  Diagnosis Date  . Anxiety 09/25/2011  . Basal cell cancer 09/25/2011   Mid back 2007  . Chronic pain syndrome 09/25/2011  . Degenerative joint disease 09/25/2011  . Depression 09/25/2011  . DM (diabetes mellitus) ([place]) 09/25/2011  . HTN (hypertension) 09/25/2011  . Melanoma ([place]) 09/25/2011   Right thigh 2005  . Wears glasses     Past Surgical History:  Procedure Laterality Date  . INCISION AND DRAINAGE ABSCESS N/A 06/28/2013   Procedure: INCISION AND DRAINAGE SCROTAL ABSCESS;  Surgeon: Reece Packer, MD;  Location: [facility];  Service: Urology;  Laterality: N/A;  . lymph node removal    . MINOR IRRIGATION AND DEBRIDEMENT OF WOUND Right 08/16/2013   Procedure: MINOR DEBRIDEMENT OF RIGHT GROIN/SCROTUM ULCER WITH PLACEMENT OF ACELL/POSSIBLE TISSUE ADVANCEMENT ;  Surgeon: Theodoro Kos, DO;  Location: [place];  Service: Plastics;  Laterality: Right;  . SCROTAL EXPLORATION N/A 06/30/2013   Procedure: SCROTUM DEBRIDEMENT;  Surgeon: [name], MD;  Location: [facility];  Service: Urology;  Laterality: N/A;  . tumor removal from right thigh       Family History  Problem Relation Age of Onset  . Aneurysm Mother   . Cancer Father   . Liver disease Neg Hx   . Diabetes Paternal Grandfather     Social History:  reports that he has been smoking Cigarettes.  He has been smoking about 0.50 packs per  day. He has never used smokeless tobacco. He reports that he drinks about 0.6 oz of alcohol per week . He reports that he uses drugs.    Review of Systems    He has not taken his Seroquel. Has not discussed treatment with his PCP      Lipids: Has had variable LDL levels , now taking Lipitor and LDL is below 100  Also followed by PCP       Lab Results  Component Value Date   CHOL 112 12/06/2015   HDL 33.80 (L) 12/06/2015   LDLCALC 66 12/06/2015   TRIG 64.0 12/06/2015   CHOLHDL 3 12/06/2015                  The blood pressure has been treated with lisinopril With good control     He has a history of Numbness, tingling and pain in feet and lower legs including at night; he has taken gabapentin    Physical Examination:  BP 127/75   Pulse 66   Temp 98.2 F (36.8 C)   Resp 16   Ht '5\' 10"'  (1.778 m)   Wt 209 lb 6.4 oz (95 kg)   SpO2 98%   BMI 30.05 kg/m    ASSESSMENT/PLAN:  Diabetes type 2, uncontrolled    See history of present illness for detailed discussion of his current management, blood sugar patterns and problems identified    His blood sugars are overall relatively better This is likely to be from less postprandial hyperglycemia with improved diet He is still not taking NovoLog consistently either he has a low-normal blood sugar before eating or if he is eating out Sometimes may have high readings noting overnight with higher fat meals Is somewhat more active although has not lost any weight  Discussed day-to-day management including timing of glucose monitoring including some readings after meals at night, taking NovoLog consistently at every meal which has carbohydrates, not adjusting Toujeo based on  diet or bedtime reading  He will reduce his Toujeo down to 85 for now He will take 10-15 units of NovoLog based on meal size right before eating Avoid high-fat meats and also have protein at every meal   Patient Instructions  Toujeo 85 daily  Take Novolog with each meal especially meats and carbs  Check blood sugars on waking up  4/7 days  Also check blood sugars about 2 hours after a meal and do this after different meals by rotation  Recommended blood sugar levels on waking up is 90-130 and about 2 hours after meal is 130-160  Please bring your blood sugar monitor to each visit, thank you      Counseling time on subjects discussed above is over 50% of today's 25 minute visit   Kymani Shimabukuro 03/13/2016, 12:48 PM   Note: This office note was prepared with Estate agent. Any transcriptional errors that result from this process are unintentional.

## 2016-03-13 NOTE — Patient Instructions (Addendum)
Toujeo 85 daily  Take Novolog with each meal especially meats and carbs  Check blood sugars on waking up  4/7 days  Also check blood sugars about 2 hours after a meal and do this after different meals by rotation  Recommended blood sugar levels on waking up is 90-130 and about 2 hours after meal is 130-160  Please bring your blood sugar monitor to each visit, thank you

## 2016-04-15 ENCOUNTER — Other Ambulatory Visit: Payer: Self-pay | Admitting: Endocrinology

## 2016-05-14 ENCOUNTER — Other Ambulatory Visit: Payer: Self-pay

## 2016-05-14 MED ORDER — INSULIN LISPRO 100 UNIT/ML (KWIKPEN): PEN_INJECTOR | SUBCUTANEOUS | 2 refills | Status: DC

## 2016-05-28 ENCOUNTER — Other Ambulatory Visit: Payer: Self-pay | Admitting: Endocrinology

## 2016-05-30 ENCOUNTER — Other Ambulatory Visit: Payer: Self-pay | Admitting: Endocrinology

## 2016-06-11 ENCOUNTER — Other Ambulatory Visit: Payer: Medicare Other

## 2016-06-15 ENCOUNTER — Ambulatory Visit: Payer: Medicare Other | Admitting: Endocrinology

## 2016-06-24 ENCOUNTER — Other Ambulatory Visit: Payer: Medicare Other

## 2016-06-26 ENCOUNTER — Other Ambulatory Visit: Payer: Self-pay | Admitting: Endocrinology

## 2016-06-29 ENCOUNTER — Other Ambulatory Visit (INDEPENDENT_AMBULATORY_CARE_PROVIDER_SITE_OTHER): Payer: Medicare Other

## 2016-06-29 ENCOUNTER — Ambulatory Visit (INDEPENDENT_AMBULATORY_CARE_PROVIDER_SITE_OTHER): Payer: Medicare Other | Admitting: Endocrinology

## 2016-06-29 DIAGNOSIS — Z794 Long term (current) use of insulin: Secondary | ICD-10-CM

## 2016-06-29 DIAGNOSIS — E1165 Type 2 diabetes mellitus with hyperglycemia: Secondary | ICD-10-CM

## 2016-06-29 LAB — BASIC METABOLIC PANEL
BUN: 22 mg/dL (ref 6–23)
CHLORIDE: 108 meq/L (ref 96–112)
CO2: 24 mEq/L (ref 19–32)
CREATININE: 1.07 mg/dL (ref 0.40–1.50)
Calcium: 9.4 mg/dL (ref 8.4–10.5)
GFR: 75.21 mL/min (ref >60.00)
Glucose, Bld: 144 mg/dL — ABNORMAL HIGH (ref 70–99)
Potassium: 4.5 mEq/L (ref 3.5–5.1)
Sodium: 137 mEq/L (ref 135–145)

## 2016-06-29 LAB — HEMOGLOBIN A1C: HEMOGLOBIN A1C: 8.4 % — AB (ref 4.6–6.5)

## 2016-06-30 ENCOUNTER — Ambulatory Visit (INDEPENDENT_AMBULATORY_CARE_PROVIDER_SITE_OTHER): Payer: Medicare Other | Admitting: Endocrinology

## 2016-06-30 ENCOUNTER — Encounter: Payer: Self-pay | Admitting: Endocrinology

## 2016-06-30 VITALS — BP 128/78 | HR 67 | Ht 70.0 in | Wt 213.0 lb

## 2016-06-30 DIAGNOSIS — Z794 Long term (current) use of insulin: Secondary | ICD-10-CM | POA: Diagnosis not present

## 2016-06-30 DIAGNOSIS — I10 Essential (primary) hypertension: Secondary | ICD-10-CM

## 2016-06-30 DIAGNOSIS — F331 Major depressive disorder, recurrent, moderate: Secondary | ICD-10-CM

## 2016-06-30 DIAGNOSIS — E1165 Type 2 diabetes mellitus with hyperglycemia: Secondary | ICD-10-CM

## 2016-06-30 NOTE — Progress Notes (Signed)
Patient ID: Charles Manning, male   DOB: December 18, 1955, 61 y.o.   MRN: [id number]           Reason for Appointment:  Follow-up for Type 2 Diabetes  History of Present Illness:          Diagnosis: Type 2 diabetes mellitus         Past history:  Apparently has been taking insulin for the last 20 years or so with various regimens Initially may have taken Glucophage/metformin and does not know what other oral agents he may have tried previously On his initial consultation he was taking Levemir, has also taken Lantus Previous A1c levels had been about 9-10 but as high as 14.7 in 1/15 On his initial consultation he was switched from Levemir to [facility] and also advised to start Novolog at mealtimes With this his blood sugars improved significantly   Recent history:   INSULIN regimen is described as:  Toujeo 90 units q pm, Novolog 0- 10 units at meals   His A1c is significantly higher at 8.4, previously was better at 7%  Current management, blood sugar patterns and problems identified:  He has not been following any instructions for his diabetes care regularly and blood sugars are poorly controlled  He says that he has been inconsistent with watching his diet and sweets although recently has done a little better  He is also very unmotivated to take his blood sugar readings regularly  Previously had taken Novolog for most meals but is doing this very sporadically.  He forgets to do this and also thinks he is afraid of hypoglycemia with this.  Previously taking up to 15 units now taking usually less than 10 units  He had a steroid injection in his shoulder about 10 days ago and that afternoon his glucose was 340 but he did not do any follow-up monitoring  He thinks he is having issues with depression and not motivated to take care of his diabetes as directed previously  He was told to reduce his Toujeo down to 85 units previously but he is still taking 90  No hypoglycemia recorded and has not  felt blood sugar go low at any time  Weight has gone up  He did see the nurse educator in 1/17 for diabetes teaching        Oral hypoglycemic drugs the patient is taking are: Metformin ER 1000 mg daily        Compliance with the medical regimen: Fair to poor  Hypoglycemia:  minimal, feels symptomatic when blood sugars are around 70  Glucose monitoring:  done 0-1 time a day         Glucometer:  Accu-Chek     Blood Glucose readings by download show  Mean values apply above for all meters except median for One Touch  PRE-MEAL Fasting 1-2 PM  Dinner Bedtime Overall  Glucose range: 89-115  136-340      Mean/median:     162    Self-care: The diet that the patient has been following is: tries to limit starchy foods .     Meals: 2-3 meals per day. Breakfast is none or fruit. Some bacon, eggs .  Will have hamburger or ribs at meals.  Larger lunch at 1 pm            Exercise: Various activities such as cutting wood CDE 12/16     Dietician visit, most recent:None.  Weight history:  Wt Readings from Last 3 Encounters:  06/30/16 213 lb (96.6 kg)  03/13/16 209 lb 6.4 oz (95 kg)  12/11/15 208 lb 8 oz (94.6 kg)    Glycemic control:   A1c was 10.3 in 12/16  Lab Results  Component Value Date   HGBA1C 8.4 (H) 06/29/2016   HGBA1C 7.0 (H) 03/10/2016   HGBA1C 7.9 (H) 12/06/2015   Lab Results  Component Value Date   MICROALBUR 2.9 (H) 12/06/2015   LDLCALC 66 12/06/2015   CREATININE 1.07 06/29/2016       Allergies as of 06/30/2016      Reactions   Vancomycin Itching   Able to tolerate with diphenhydramine premedication and increased vancomycin infusion time      Medication List       Accurate as of 06/30/16  2:11 PM. Always use your most recent med list.          ACCU-CHEK AVIVA PLUS test strip Generic drug:  glucose blood   ACCU-CHEK AVIVA PLUS w/Device Kit   atorvastatin 10 MG tablet Commonly known as:  LIPITOR Take 1 tablet (10 mg total) by mouth  daily.   BOOSTRIX 5-2.5-18.5 LF-MCG/0.5 injection Generic drug:  Tdap   gabapentin 300 MG capsule Commonly known as:  NEURONTIN TAKE 1 CAPSULE(300 MG) BY MOUTH THREE TIMES DAILY   ibuprofen 800 MG tablet Commonly known as:  ADVIL,MOTRIN   Insulin Glargine 300 UNIT/ML Sopn Commonly known as:  TOUJEO SOLOSTAR Inject 110 Units into the skin daily.   TOUJEO SOLOSTAR 300 UNIT/ML Sopn Generic drug:  Insulin Glargine INJECT 110 UNITS INTO THE SKIN EVERY MORNING BEFORE BREAKFAST   insulin lispro 100 UNIT/ML KiwkPen Commonly known as:  HUMALOG KWIKPEN Inject 15 units three times a day with meals.   lisinopril 10 MG tablet Commonly known as:  PRINIVIL,ZESTRIL TAKE 1 TABLET BY MOUTH EVERY DAY   meloxicam 15 MG tablet Commonly known as:  MOBIC Take 15 mg by mouth daily. Reported on 09/06/2015   metFORMIN 500 MG 24 hr tablet Commonly known as:  GLUCOPHAGE-XR TAKE 4 TABLETS(2000 MG) BY MOUTH DAILY WITH DINNER   sertraline 50 MG tablet Commonly known as:  ZOLOFT   tiZANidine 2 MG tablet Commonly known as:  ZANAFLEX       Allergies:  Allergies  Allergen Reactions  . Vancomycin Itching    Able to tolerate with diphenhydramine premedication and increased vancomycin infusion time    Past Medical History:  Diagnosis Date  . Anxiety 09/25/2011  . Basal cell cancer 09/25/2011   Mid back 2007  . Chronic pain syndrome 09/25/2011  . Degenerative joint disease 09/25/2011  . Depression 09/25/2011  . DM (diabetes mellitus) ([place]) 09/25/2011  . HTN (hypertension) 09/25/2011  . Melanoma ([place]) 09/25/2011   Right thigh 2005  . Wears glasses     Past Surgical History:  Procedure Laterality Date  . INCISION AND DRAINAGE ABSCESS N/A 06/28/2013   Procedure: INCISION AND DRAINAGE SCROTAL ABSCESS;  Surgeon: Reece Packer, MD;  Location: [facility];  Service: Urology;  Laterality: N/A;  . lymph node removal    . MINOR IRRIGATION AND DEBRIDEMENT OF WOUND Right 08/16/2013   Procedure: MINOR  DEBRIDEMENT OF RIGHT GROIN/SCROTUM ULCER WITH PLACEMENT OF ACELL/POSSIBLE TISSUE ADVANCEMENT ;  Surgeon: Theodoro Kos, DO;  Location: [place];  Service: Plastics;  Laterality: Right;  . SCROTAL EXPLORATION N/A 06/30/2013   Procedure: SCROTUM DEBRIDEMENT;  Surgeon: [name], MD;  Location: [facility];  Service: Urology;  Laterality: N/A;  . tumor removal from right thigh      Family History  Problem Relation Age of Onset  . Aneurysm Mother   . Cancer Father   . Liver disease Neg Hx   . Diabetes Paternal Grandfather     Social History:  reports that he has been smoking Cigarettes.  He has been smoking about 0.50 packs per day. He has never used smokeless tobacco. He reports that he drinks about 0.6 oz of alcohol per week . He reports that he uses drugs.    Review of Systems    He has  Just started taking 50 mg Zoloft for depression      Lipids: Has had variable LDL levels ,  taking Lipitor 10 mg and LDL is below 100  Also followed by PCP       Lab Results  Component Value Date   CHOL 112 12/06/2015   HDL 33.80 (L) 12/06/2015   LDLCALC 66 12/06/2015   TRIG 64.0 12/06/2015   CHOLHDL 3 12/06/2015                  The blood pressure has been treated with lisinopril 10 mg With good control     He has a history of Numbness, tingling and pain in feet and lower legs including at night; he has taken gabapentin with relief    Physical Examination:  BP 128/78   Pulse 67   Ht '5\' 10"'  (1.778 m)   Wt 213 lb (96.6 kg)   SpO2 93%   BMI 30.56 kg/m    ASSESSMENT/PLAN:  Diabetes type 2, uncontrolled    See history of present illness for detailed discussion of his current management, blood sugar patterns and problems identified    His blood sugars are Poorly controlled Although he was doing previously fairly well with checking his sugar and taking mealtime insulin he is not motivated to do any of this well Also has been inconsistent with diet especially  last month A1c is higher at 8.4  He is still not improved with his motivation level despite starting Zoloft Given him various options of improving his diabetes control such as the V-go pump and also doing the professional freestyle [employer]re recording However he is not wanting to do any of this and does not think he can handle what is involved with the above  He is however agreeing to keep a diary of his insulin doses and blood sugars He will follow-up with nurse educator in about 3 weeks Discussed need to take mealtime insulin consistently with all meals Also if he is doing better with his diet and mealtime insulin he may need to reduce his Toujeo back to 85 Discussed fasting blood sugar targets as also postprandial targets  DEPRESSION: He needs to follow-up with his PCP if he is not improving with the 50 mg Zoloft, he most likely will need a higher dose given the degree of depression and current lack of benefit   Patient Instructions  Check blood sugars on waking up  3x weekly  Also check blood sugars about 2 hours after a meal and do this after different meals by rotation  Recommended blood sugar levels on waking up is 90-130 and about 2 hours after meal is 130-160  Please bring your blood sugar monitor to each visit, thank you  Keep record of insulin and sugar after meal    Counseling time on subjects discussed above is over 50% of today's 25 minute visit  Camellia Popescu 06/30/2016, 2:11 PM   Note: This office note was prepared with Dragon voice recognition system technology. Any transcriptional errors that result from this process are unintentional.

## 2016-06-30 NOTE — Patient Instructions (Addendum)
Check blood sugars on waking up  3x weekly  Also check blood sugars about 2 hours after a meal and do this after different meals by rotation  Recommended blood sugar levels on waking up is 90-130 and about 2 hours after meal is 130-160  Please bring your blood sugar monitor to each visit, thank you  Keep record of insulin and sugar after meal

## 2016-07-02 ENCOUNTER — Other Ambulatory Visit: Payer: Self-pay | Admitting: Endocrinology

## 2016-07-20 ENCOUNTER — Other Ambulatory Visit: Payer: Self-pay

## 2016-07-20 MED ORDER — LISINOPRIL 10 MG PO TABS: 10.0000 mg | ORAL_TABLET | Freq: Every day | ORAL | 0 refills | Status: DC

## 2016-07-20 MED ORDER — GABAPENTIN 300 MG PO CAPS: ORAL_CAPSULE | ORAL | 1 refills | Status: DC

## 2016-07-20 MED ORDER — INSULIN GLARGINE 300 UNIT/ML ~~LOC~~ SOPN: 90.0000 [IU] | PEN_INJECTOR | Freq: Every day | SUBCUTANEOUS | 1 refills | Status: DC

## 2016-07-20 MED ORDER — METFORMIN HCL ER 500 MG PO TB24: ORAL_TABLET | ORAL | 1 refills | Status: DC

## 2016-07-20 MED ORDER — ATORVASTATIN CALCIUM 10 MG PO TABS: 10.0000 mg | ORAL_TABLET | Freq: Every day | ORAL | 3 refills | Status: DC

## 2016-07-20 MED ORDER — INSULIN LISPRO 100 UNIT/ML (KWIKPEN): PEN_INJECTOR | SUBCUTANEOUS | 2 refills | Status: DC

## 2016-07-22 ENCOUNTER — Other Ambulatory Visit: Payer: Self-pay | Admitting: Endocrinology

## 2016-07-22 ENCOUNTER — Encounter: Payer: Medicare Other | Admitting: Nutrition

## 2016-07-22 DIAGNOSIS — E1165 Type 2 diabetes mellitus with hyperglycemia: Secondary | ICD-10-CM

## 2016-07-22 DIAGNOSIS — Z794 Long term (current) use of insulin: Principal | ICD-10-CM

## 2016-07-23 ENCOUNTER — Other Ambulatory Visit: Payer: Self-pay | Admitting: Endocrinology

## 2016-08-02 ENCOUNTER — Other Ambulatory Visit: Payer: Self-pay | Admitting: Endocrinology

## 2016-08-03 ENCOUNTER — Other Ambulatory Visit: Payer: Self-pay | Admitting: Endocrinology

## 2016-08-14 ENCOUNTER — Other Ambulatory Visit: Payer: Self-pay | Admitting: Endocrinology

## 2016-09-11 ENCOUNTER — Other Ambulatory Visit: Payer: Self-pay | Admitting: Endocrinology

## 2016-09-24 ENCOUNTER — Other Ambulatory Visit: Payer: Medicare Other

## 2016-09-28 ENCOUNTER — Ambulatory Visit: Payer: Medicare Other | Admitting: Endocrinology

## 2016-09-28 ENCOUNTER — Telehealth: Payer: Self-pay | Admitting: Endocrinology

## 2016-09-28 NOTE — Telephone Encounter (Signed)
Follow-up necessary as soon as possible 

## 2016-09-28 NOTE — Telephone Encounter (Signed)
Patient no showed today's appt. Please advise on how to follow up. °A. No follow up necessary. °B. Follow up urgent. Contact patient immediately. °C. Follow up necessary. Contact patient and schedule visit in ___ days. °D. Follow up advised. Contact patient and schedule visit in ____weeks. ° °

## 2016-09-29 NOTE — Telephone Encounter (Signed)
LM for pt to call back to schedule °

## 2016-10-06 ENCOUNTER — Other Ambulatory Visit: Payer: Self-pay | Admitting: Endocrinology

## 2016-10-16 ENCOUNTER — Other Ambulatory Visit: Payer: Self-pay | Admitting: Endocrinology

## 2016-10-27 ENCOUNTER — Other Ambulatory Visit: Payer: Self-pay | Admitting: Endocrinology

## 2016-11-05 ENCOUNTER — Other Ambulatory Visit: Payer: Self-pay | Admitting: Endocrinology

## 2016-11-26 ENCOUNTER — Other Ambulatory Visit: Payer: Self-pay | Admitting: Endocrinology

## 2016-12-30 LAB — LIPID PANEL: LDL Cholesterol: 59

## 2016-12-30 LAB — HEMOGLOBIN A1C: Hemoglobin A1C: 7

## 2016-12-30 LAB — PSA: PSA: 3.28

## 2016-12-30 LAB — MICROALBUMIN, URINE: MICROALB UR: 13.6

## 2016-12-30 LAB — BASIC METABOLIC PANEL: Creatinine: 0.8 (ref 0.6–1.3)

## 2017-01-01 ENCOUNTER — Encounter: Payer: Self-pay | Admitting: Endocrinology

## 2017-01-01 ENCOUNTER — Ambulatory Visit (INDEPENDENT_AMBULATORY_CARE_PROVIDER_SITE_OTHER): Payer: Medicare Other | Admitting: Endocrinology

## 2017-01-01 VITALS — BP 146/94 | HR 59 | Ht 69.0 in | Wt 207.2 lb

## 2017-01-01 DIAGNOSIS — Z794 Long term (current) use of insulin: Secondary | ICD-10-CM

## 2017-01-01 DIAGNOSIS — E1165 Type 2 diabetes mellitus with hyperglycemia: Secondary | ICD-10-CM

## 2017-01-01 MED ORDER — FREESTYLE LIBRE SENSOR SYSTEM MISC: 3 refills | Status: DC

## 2017-01-01 MED ORDER — FREESTYLE LIBRE READER DEVI: 1.0000 | 0 refills | Status: DC

## 2017-01-01 NOTE — Progress Notes (Signed)
Patient ID: Charles Manning, male   DOB: 08-05-1955, 61 y.o.   MRN: [id number]           Reason for Appointment:  Follow-up for Type 2 Diabetes  History of Present Illness:          Diagnosis: Type 2 diabetes mellitus         Past history:  Apparently has been taking insulin for the last 20 years or so with various regimens Initially may have taken Glucophage/metformin and does not know what other oral agents he may have tried previously On his initial consultation he was taking Levemir, has also taken Lantus Previous A1c levels had been about 9-10 but as high as 14.7 in 1/15 On his initial consultation he was switched from Levemir to [facility] and also advised to start Novolog at mealtimes With this his blood sugars improved significantly   Recent history:   INSULIN regimen is described as:  Toujeo 90 units q pm, Novolog 0- 10 units at meals   His A1c is much better at 7% done this week by his PCP, previously was significantly higher at 8.4  Current management, blood sugar patterns and problems identified:  He has checked blood sugars only sporadically and mostly in the morning  He is periodically getting relatively low blood sugars in the mornings with readings as low as 56  More recently has been cutting back on his carbohydrates, sweets and high-fat foods and has lost 6 pounds  With this his blood sugars are overall better  However not clear if his blood sugars are high after meals  He does not take NovoLog with meals usually as he forgets  He was told to reduce his Toujeo down to 85 units previously but he is still taking 90  Has only one reading at bedtime which was 100  Cannot explain why he has couple of high readings in the mornings and midday  He has been compliant with his metformin  He did see the nurse educator in 1/17 for diabetes teaching        Oral hypoglycemic drugs the patient is taking are: Metformin ER 1000 mg twice daily        Hypoglycemia:   minimal, feels symptomatic when blood sugars are around 70  Glucose monitoring:  done 0-1 time a day         Glucometer:  Accu-Chek     Blood Glucose readings by download show  Mean values apply above for all meters except median for One Touch  PRE-MEAL Fasting Lunch Dinner Bedtime Overall  Glucose range: 56-2 02  81  66  100    Mean/median: 105     106    POST-MEAL PC Breakfast PC Lunch PC Dinner  Glucose range:  92, 176    Mean/median:         Self-care: The diet that the patient has been following is: tries to limit starchy foods .     Meals: 2-3 meals per day. Breakfast is none or fruit. Some bacon, eggs .  Will have hamburger or ribs at meals.  Larger lunch at 1 pm            Exercise: Various activities such as cutting wood CDE 06/2015    Dietician visit, most recent:None.               Weight history:  Wt Readings from Last 3 Encounters:  01/01/17 207 lb 3.2 oz (94 kg)  06/30/16 213 lb (96.6  kg)  03/13/16 209 lb 6.4 oz (95 kg)    Glycemic control:   A1c was 10.3 in 12/16  Lab Results  Component Value Date   HGBA1C 8.4 (H) 06/29/2016   HGBA1C 7.0 (H) 03/10/2016   HGBA1C 7.9 (H) 12/06/2015   Lab Results  Component Value Date   MICROALBUR 2.9 (H) 12/06/2015   LDLCALC 66 12/06/2015   CREATININE 1.07 06/29/2016       Allergies as of 01/01/2017      Reactions   Vancomycin Itching   Able to tolerate with diphenhydramine premedication and increased vancomycin infusion time      Medication List       Accurate as of 01/01/17 11:59 PM. Always use your most recent med list.          ACCU-CHEK AVIVA PLUS test strip Generic drug:  glucose blood   ACCU-CHEK AVIVA PLUS w/Device Kit   atorvastatin 10 MG tablet Commonly known as:  LIPITOR TAKE 1 TABLET(10 MG) BY MOUTH DAILY   BOOSTRIX 5-2.5-18.5 LF-MCG/0.5 injection Generic drug:  Tdap   FREESTYLE LIBRE READER Devi 1 Device by Does not apply route as directed.   [place]  Misc Apply to upper arm and change sensor every 10 days   gabapentin 300 MG capsule Commonly known as:  NEURONTIN TAKE 1 CAPSULE(300 MG) BY MOUTH THREE TIMES DAILY   HUMALOG KWIKPEN 100 UNIT/ML KiwkPen Generic drug:  insulin lispro INJECT 15 UNITS THREE TIMES DAILY WITH MEALS. THIS IS TO REPLACE NOVOLOG   ibuprofen 800 MG tablet Commonly known as:  ADVIL,MOTRIN   lisinopril 10 MG tablet Commonly known as:  PRINIVIL,ZESTRIL TAKE 1 TABLET BY MOUTH EVERY DAY   meloxicam 15 MG tablet Commonly known as:  MOBIC Take 15 mg by mouth daily. Reported on 09/06/2015   metFORMIN 500 MG 24 hr tablet Commonly known as:  GLUCOPHAGE-XR TAKE 4 TABLETS(2000 MG) BY MOUTH DAILY WITH DINNER   sertraline 50 MG tablet Commonly known as:  ZOLOFT   tiZANidine 2 MG tablet Commonly known as:  ZANAFLEX   TOUJEO SOLOSTAR 300 UNIT/ML Sopn Generic drug:  Insulin Glargine INJECT 110 UNITS INTO THE SKIN EVERY MORNING BEFORE BREAKFAST       Allergies:  Allergies  Allergen Reactions  . Vancomycin Itching    Able to tolerate with diphenhydramine premedication and increased vancomycin infusion time    Past Medical History:  Diagnosis Date  . Anxiety 09/25/2011  . Basal cell cancer 09/25/2011   Mid back 2007  . Chronic pain syndrome 09/25/2011  . Degenerative joint disease 09/25/2011  . Depression 09/25/2011  . DM (diabetes mellitus) ([place]) 09/25/2011  . HTN (hypertension) 09/25/2011  . Melanoma ([place]) 09/25/2011   Right thigh 2005  . Wears glasses     Past Surgical History:  Procedure Laterality Date  . INCISION AND DRAINAGE ABSCESS N/A 06/28/2013   Procedure: INCISION AND DRAINAGE SCROTAL ABSCESS;  Surgeon: Reece Packer, MD;  Location: [facility];  Service: Urology;  Laterality: N/A;  . lymph node removal    . MINOR IRRIGATION AND DEBRIDEMENT OF WOUND Right 08/16/2013   Procedure: MINOR DEBRIDEMENT OF RIGHT GROIN/SCROTUM ULCER WITH PLACEMENT OF ACELL/POSSIBLE TISSUE ADVANCEMENT ;  Surgeon: Theodoro Kos, DO;  Location: [facility];  Service: Plastics;  Laterality: Right;  . SCROTAL EXPLORATION N/A 06/30/2013   Procedure: SCROTUM DEBRIDEMENT;  Surgeon: [name], MD;  Location: [facility];  Service: Urology;  Laterality: N/A;  . tumor removal from right thigh  Family History  Problem Relation Age of Onset  . Aneurysm Mother   . Cancer Father   . Liver disease Neg Hx   . Diabetes Paternal Grandfather     Social History:  reports that he has been smoking Cigarettes.  He has been smoking about 0.50 packs per day. He has never used smokeless tobacco. He reports that he drinks about 0.6 oz of alcohol per week . He reports that he uses drugs.    Review of Systems    He has  Felt better with Zoloft from PCP      Lipids: Has had relatively lower LDL levels and more recently checked by PCP         Lab Results  Component Value Date   CHOL 112 12/06/2015   HDL 33.80 (L) 12/06/2015   LDLCALC 66 12/06/2015   TRIG 64.0 12/06/2015   CHOLHDL 3 12/06/2015                  The blood pressure has been treated with lisinopril 10 mg, Blood pressure is relatively high today but was excellent with PCP earlier this week     He has a history of Numbness, tingling and pain in feet and lower legs including at night; he has taken gabapentin with relief    Physical Examination:  BP (!) 146/94   Pulse (!) 59   Ht '5\' 9"'  (1.753 m)   Wt 207 lb 3.2 oz (94 kg)   SpO2 91%   BMI 30.60 kg/m    ASSESSMENT/PLAN:  Diabetes type 2, BMI 30    See history of present illness for detailed discussion of his current management, blood sugar patterns and problems identified    His blood sugars are much better controlled recently with A1c down to 7%  This is as a result of his depression being better controlled and he is more motivated to watch diet and avoid excess carbohydrates and high-fat foods He has lost some weight He is generally active with working but not doing  any formal exercise  Difficult to know if he needs any adjustment of his mealtime insulin since he does not check after meals and usually does not take NovoLog as he forgets For now he will cut his Toujeo to 80 units to prevent further overnight hypoglycemia Discussed importance of rotating timing of glucose monitoring  Continue follow-up with PCP for hypertension  Patient Instructions  Take 80 UNITS insulin and keep sugars in range  Check blood sugars on waking up  3/7   Also check blood sugars about 2 hours after a meal and do this after different meals by rotation  Recommended blood sugar levels on waking up is 90-130 and about 2 hours after meal is 130-160  Please bring your blood sugar monitor to each visit, thank you       [facility] 01/03/2017, 4:02 PM   Note: This office note was prepared with Dragon voice recognition system technology. Any transcriptional errors that result from this process are unintentional.

## 2017-01-01 NOTE — Patient Instructions (Addendum)
Take 80 UNITS insulin and keep sugars in range  Check blood sugars on waking up  3/7   Also check blood sugars about 2 hours after a meal and do this after different meals by rotation  Recommended blood sugar levels on waking up is 90-130 and about 2 hours after meal is 130-160  Please bring your blood sugar monitor to each visit, thank you

## 2017-01-03 ENCOUNTER — Encounter: Payer: Self-pay | Admitting: Endocrinology

## 2017-01-03 ENCOUNTER — Other Ambulatory Visit: Payer: Self-pay | Admitting: Endocrinology

## 2017-01-28 ENCOUNTER — Other Ambulatory Visit: Payer: Self-pay | Admitting: Endocrinology

## 2017-01-30 ENCOUNTER — Other Ambulatory Visit: Payer: Self-pay | Admitting: Endocrinology

## 2017-04-15 ENCOUNTER — Other Ambulatory Visit: Payer: Self-pay | Admitting: Endocrinology

## 2017-05-13 ENCOUNTER — Other Ambulatory Visit: Payer: Self-pay | Admitting: Endocrinology

## 2017-06-20 ENCOUNTER — Other Ambulatory Visit: Payer: Self-pay | Admitting: Endocrinology

## 2017-07-13 ENCOUNTER — Encounter (HOSPITAL_COMMUNITY): Payer: Self-pay

## 2017-07-13 ENCOUNTER — Emergency Department (HOSPITAL_COMMUNITY)
Admission: EM | Admit: 2017-07-13 | Discharge: 2017-07-13 | Disposition: A | Discharge status: Home / Self Care | Payer: Medicare Other | Attending: Emergency Medicine | Admitting: Emergency Medicine

## 2017-07-13 ENCOUNTER — Other Ambulatory Visit: Payer: Self-pay

## 2017-07-13 DIAGNOSIS — I10 Essential (primary) hypertension: Secondary | ICD-10-CM | POA: Diagnosis not present

## 2017-07-13 DIAGNOSIS — Z7984 Long term (current) use of oral hypoglycemic drugs: Secondary | ICD-10-CM | POA: Diagnosis not present

## 2017-07-13 DIAGNOSIS — Z79899 Other long term (current) drug therapy: Secondary | ICD-10-CM | POA: Diagnosis not present

## 2017-07-13 DIAGNOSIS — L03211 Cellulitis of face: Secondary | ICD-10-CM | POA: Diagnosis not present

## 2017-07-13 DIAGNOSIS — F17210 Nicotine dependence, cigarettes, uncomplicated: Secondary | ICD-10-CM | POA: Diagnosis not present

## 2017-07-13 DIAGNOSIS — E119 Type 2 diabetes mellitus without complications: Secondary | ICD-10-CM | POA: Insufficient documentation

## 2017-07-13 DIAGNOSIS — K0889 Other specified disorders of teeth and supporting structures: Secondary | ICD-10-CM | POA: Diagnosis present

## 2017-07-13 DIAGNOSIS — Z85828 Personal history of other malignant neoplasm of skin: Secondary | ICD-10-CM | POA: Insufficient documentation

## 2017-07-13 DIAGNOSIS — K047 Periapical abscess without sinus: Secondary | ICD-10-CM | POA: Diagnosis not present

## 2017-07-13 MED ORDER — AMOXICILLIN-POT CLAVULANATE 875-125 MG PO TABS: 1.0000 | ORAL_TABLET | Freq: Two times a day (BID) | ORAL | 0 refills | Status: DC

## 2017-07-13 MED ORDER — TRAMADOL HCL 50 MG PO TABS: 50.0000 mg | ORAL_TABLET | Freq: Four times a day (QID) | ORAL | 0 refills | Status: DC | PRN

## 2017-07-13 MED ORDER — CLINDAMYCIN PHOSPHATE 600 MG/50ML IV SOLN
600.0000 mg | Freq: Once | INTRAVENOUS | Status: AC
  Administered 2017-07-13: 600 mg via INTRAVENOUS
  Filled 2017-07-13: qty 50

## 2017-07-13 MED ORDER — OXYCODONE-ACETAMINOPHEN 5-325 MG PO TABS: 1.0000 | ORAL_TABLET | Freq: Three times a day (TID) | ORAL | 0 refills | Status: DC | PRN

## 2017-07-13 MED ORDER — OXYCODONE-ACETAMINOPHEN 5-325 MG PO TABS
1.0000 | ORAL_TABLET | Freq: Once | ORAL | Status: AC
  Administered 2017-07-13: 1 via ORAL
  Filled 2017-07-13: qty 1

## 2017-07-13 NOTE — Discharge Instructions (Signed)
Please take all of your antibiotics until finished!   Take ibuprofen as needed for mild to moderate pain. Pain medication only as needed for severe pain - This can make you very drowsy - please do not drink alcohol, operate heavy machinery or drive on this medication.   Eat a soft or liquid diet and rinse your mouth out after meals with warm water. You should return here at once if you have increased swelling, increased pain or fevers.   Please call the dentist in the morning to schedule a follow up appointment. They will probably want to see you in about a week.

## 2017-07-13 NOTE — ED Triage Notes (Addendum)
Pt c/o dental pain from infection. Sent by dentist for antibiotics. PA already in room. Will await orders.

## 2017-07-13 NOTE — ED Provider Notes (Signed)
[facility] DEPT Provider Note   CSN: [id number] Arrival date & time: 07/13/17  1651     History   Chief Complaint No chief complaint on file.   HPI Charles Manning is a 62 y.o. male.  The history is provided by the patient and medical records. No language interpreter was used.    Charles Manning is a 62 y.o. male with hx of DM, HTN who presents to the Emergency Department complaining of persistent, gradually worsening, right-sided, upper dental pain beginning 2 days ago. Pt describes their pain as throbbing and burning. He went to the dentist this afternoon and was told he had an infection. He was told to come to the ER for dose of IV antibiotics. Associated with facial swelling on the right. Pt denies fever, chills, difficulty breathing, difficulty swallowing.   Past Medical History:  Diagnosis Date  . Anxiety 09/25/2011  . Basal cell cancer 09/25/2011   Mid back 2007  . Chronic pain syndrome 09/25/2011  . Degenerative joint disease 09/25/2011  . Depression 09/25/2011  . DM (diabetes mellitus) ([place]) 09/25/2011  . HTN (hypertension) 09/25/2011  . Melanoma ([place]) 09/25/2011   Right thigh 2005  . Wears glasses     Patient Active Problem List   Diagnosis Date Noted  . Essential hypertension, benign 07/07/2013  . Hepatitis C virus infection without hepatic coma 07/07/2013  . Hypoglycemia, unspecified 07/05/2013  . Epididymitis 06/27/2013  . Cellulitis of groin, right 06/26/2013  . Cellulitis 06/26/2013  . IV drug user 03/25/2012  . Abscess of left arm 03/23/2012  . Hyponatremia 03/23/2012  . Dehydration 03/23/2012  . Abscess 01/19/2012  . Suicidal ideation 01/19/2012  . HTN (hypertension) 09/25/2011  . Diabetes mellitus type 2 with complications, uncontrolled ([place]) 09/25/2011  . Depression 09/25/2011  . Anxiety 09/25/2011  . Chronic pain syndrome 09/25/2011    Past Surgical History:  Procedure Laterality Date  . INCISION AND DRAINAGE ABSCESS  N/A 06/28/2013   Procedure: INCISION AND DRAINAGE SCROTAL ABSCESS;  Surgeon: Reece Packer, MD;  Location: [facility];  Service: Urology;  Laterality: N/A;  . lymph node removal    . MINOR IRRIGATION AND DEBRIDEMENT OF WOUND Right 08/16/2013   Procedure: MINOR DEBRIDEMENT OF RIGHT GROIN/SCROTUM ULCER WITH PLACEMENT OF ACELL/POSSIBLE TISSUE ADVANCEMENT ;  Surgeon: Theodoro Kos, DO;  Location: [facility];  Service: Plastics;  Laterality: Right;  . SCROTAL EXPLORATION N/A 06/30/2013   Procedure: SCROTUM DEBRIDEMENT;  Surgeon: [name], MD;  Location: [facility];  Service: Urology;  Laterality: N/A;  . tumor removal from right thigh         Home Medications    Prior to Admission medications   Medication Sig Start Date End Date Taking? Authorizing Provider  ACCU-CHEK AVIVA PLUS test strip  10/17/13   [provider]  atorvastatin (LIPITOR) 10 MG tablet TAKE 1 TABLET(10 MG) BY MOUTH DAILY 01/04/17   Elayne Snare, MD  Blood Glucose Monitoring Suppl (ACCU-CHEK AVIVA PLUS) W/DEVICE KIT  08/23/13   [provider]  C[place]ation 5-2.5-18.5 injection  03/12/14   [provider]  Continuous Blood Gluc Receiver (FREESTYLE LIBRE READER) DEVI 1 Device by Does not apply route as directed. 01/01/17   Elayne Snare, MD  Continuous Blood Gluc Sensor ([place]) MISC Apply to upper arm and change sensor every 10 days 01/01/17   Elayne Snare, MD  gabapentin (NEURONTIN) 300 MG capsule TAKE 1 CAPSULE(300 MG) BY MOUTH THREE TIMES DAILY 08/03/16  Elayne Snare, MD  gabapentin (NEURONTIN) 300 MG capsule TAKE 1 CAPSULE(300 MG) BY MOUTH THREE TIMES DAILY 02/01/17   Elayne Snare, MD  HUMALOG KWIKPEN 100 UNIT/ML KiwkPen INJECT 15 UNITS THREE TIMES DAILY WITH MEALS. THIS IS TO REPLACE NOVOLOG Patient taking differently: INJECT 15 UNITS ONE TIME DAILY WITH A BIG MEAL. THIS IS TO REPLACE NOVOLOG 10/16/16   Elayne Snare, MD  ibuprofen (ADVIL,MOTRIN) 800 MG tablet  07/26/15    [provider]  lisinopril (PRINIVIL,ZESTRIL) 10 MG tablet TAKE 1 TABLET BY MOUTH EVERY DAY 11/26/16   Elayne Snare, MD  meloxicam (MOBIC) 15 MG tablet Take 15 mg by mouth daily. Reported on 09/06/2015    [provider]  metFORMIN (GLUCOPHAGE-XR) 500 MG 24 hr tablet TAKE 4 TABLETS(2000 MG) BY MOUTH DAILY WITH DINNER Patient taking differently: TAKE 2-3 TABLETS BY MOUTH DAILY WITH DINNER 11/06/16   Elayne Snare, MD  metFORMIN (GLUCOPHAGE-XR) 500 MG 24 hr tablet TAKE 4 TABLETS(2000 MG) BY MOUTH DAILY WITH DINNER 05/13/17   Elayne Snare, MD  sertraline (ZOLOFT) 50 MG tablet  06/18/16   [provider]  tiZANidine (ZANAFLEX) 2 MG tablet  04/05/14   [provider]  TOUJEO SOLOSTAR 300 UNIT/ML SOPN INJECT 110 UNITS INTO THE SKIN EVERY MORNING BEFORE BREAKFAST 01/28/17   Elayne Snare, MD    Family History Family History  Problem Relation Age of Onset  . Aneurysm Mother   . Cancer Father   . Liver disease Neg Hx   . Diabetes Paternal Grandfather     Social History Social History   Tobacco Use  . Smoking status: Current Every Day Smoker    Packs/day: 0.50    Types: Cigarettes  . Smokeless tobacco: Never Used  Substance Use Topics  . Alcohol use: Yes    Alcohol/week: 0.6 oz    Types: 1 Cans of beer per week    Comment: seldom,   . Drug use: Yes    Comment: past drug use, per patient clean x 1 year     Allergies   Vancomycin   Review of Systems Review of Systems  Constitutional: Negative for chills and fever.  HENT: Positive for dental problem and facial swelling. Negative for sore throat and trouble swallowing.   Respiratory: Negative for shortness of breath.      Physical Exam Updated Vital Signs BP (!) 143/91 (BP Location: Right Arm)   Pulse 72   Temp 98.9 F (37.2 C) (Oral)   Resp 16   Ht '5\' 10"'  (1.778 m)   Wt 96.7 kg (213 lb 2 oz)   BMI 30.58 kg/m   Physical Exam  Constitutional: He appears well-developed and well-nourished. No  distress.  HENT:  Head: Normocephalic and atraumatic.  Mouth/Throat:    Dental cavities and poor oral dentition noted. Pain along tooth as depicted in image. No abscess noted. Midline uvula. No trismus. OP moist and clear. No oropharyngeal erythema or edema. Neck supple with no tenderness. + right sided facial edema. No periorbital erythema or tenderness.  Neck: Neck supple.  Cardiovascular: Normal rate, regular rhythm and normal heart sounds.  No murmur heard. Pulmonary/Chest: Effort normal and breath sounds normal. No respiratory distress. He has no wheezes. He has no rales.  Musculoskeletal: Normal range of motion.  Neurological: He is alert.  Skin: Skin is warm and dry.  Nursing note and vitals reviewed.    ED Treatments / Results  Labs (all labs ordered are listed, but only abnormal results are displayed) Labs Reviewed -  No data to display  EKG  EKG Interpretation None       Radiology No results found.  Procedures Procedures (including critical care time)  Medications Ordered in ED Medications  clindamycin (CLEOCIN) IVPB 600 mg (not administered)     Initial Impression / Assessment and Plan / ED Course  I have reviewed the triage vital signs and the nursing notes.  Pertinent labs & imaging results that were available during my care of the patient were reviewed by me and considered in my medical decision making (see chart for details).    Charles Manning is a 62 y.o. male who presents to ED for dental pain. No abscess requiring immediate incision and drainage. Patient is afebrile, non toxic appearing, and swallowing secretions well. Exam not concerning for Ludwig's angina or pharyngeal abscess. Given dose of IV ABX in ED. Will treat with Augmentin. Patient will follow up with dentist. Patient voices understanding and is agreeable to plan.  Patient discussed with Dr. Winfred Leeds who agrees with treatment plan.   Final Clinical Impressions(s) / ED Diagnoses    Final diagnoses:  Dental infection  Facial cellulitis    ED Discharge Orders    None         Ward, Ozella Almond, PA-C 07/13/17 1814    Orlie Dakin, MD 07/13/17 2356

## 2017-11-08 ENCOUNTER — Other Ambulatory Visit: Payer: Self-pay | Admitting: Endocrinology

## 2018-11-12 ENCOUNTER — Emergency Department (HOSPITAL_COMMUNITY): Payer: Medicare Other

## 2018-11-12 ENCOUNTER — Inpatient Hospital Stay (HOSPITAL_COMMUNITY)
Admission: EM | Admit: 2018-11-12 | Discharge: 2018-11-22 | DRG: 981 | Disposition: A | Discharge status: Home Health | Payer: Medicare Other | Attending: Physician Assistant | Admitting: Physician Assistant

## 2018-11-12 ENCOUNTER — Encounter (HOSPITAL_COMMUNITY): Payer: Self-pay | Admitting: Emergency Medicine

## 2018-11-12 ENCOUNTER — Other Ambulatory Visit: Payer: Self-pay

## 2018-11-12 DIAGNOSIS — S80211A Abrasion, right knee, initial encounter: Secondary | ICD-10-CM | POA: Diagnosis present

## 2018-11-12 DIAGNOSIS — D62 Acute posthemorrhagic anemia: Secondary | ICD-10-CM | POA: Diagnosis not present

## 2018-11-12 DIAGNOSIS — T148XXA Other injury of unspecified body region, initial encounter: Secondary | ICD-10-CM

## 2018-11-12 DIAGNOSIS — J9690 Respiratory failure, unspecified, unspecified whether with hypoxia or hypercapnia: Secondary | ICD-10-CM

## 2018-11-12 DIAGNOSIS — S272XXA Traumatic hemopneumothorax, initial encounter: Principal | ICD-10-CM | POA: Diagnosis present

## 2018-11-12 DIAGNOSIS — S42111A Displaced fracture of body of scapula, right shoulder, initial encounter for closed fracture: Secondary | ICD-10-CM | POA: Diagnosis present

## 2018-11-12 DIAGNOSIS — T797XXA Traumatic subcutaneous emphysema, initial encounter: Secondary | ICD-10-CM | POA: Diagnosis present

## 2018-11-12 DIAGNOSIS — S42001A Fracture of unspecified part of right clavicle, initial encounter for closed fracture: Secondary | ICD-10-CM | POA: Diagnosis present

## 2018-11-12 DIAGNOSIS — S40211A Abrasion of right shoulder, initial encounter: Secondary | ICD-10-CM | POA: Diagnosis present

## 2018-11-12 DIAGNOSIS — Y92410 Unspecified street and highway as the place of occurrence of the external cause: Secondary | ICD-10-CM | POA: Diagnosis not present

## 2018-11-12 DIAGNOSIS — S2249XA Multiple fractures of ribs, unspecified side, initial encounter for closed fracture: Secondary | ICD-10-CM | POA: Diagnosis present

## 2018-11-12 DIAGNOSIS — Z9689 Presence of other specified functional implants: Secondary | ICD-10-CM

## 2018-11-12 DIAGNOSIS — I959 Hypotension, unspecified: Secondary | ICD-10-CM | POA: Diagnosis present

## 2018-11-12 DIAGNOSIS — S50811A Abrasion of right forearm, initial encounter: Secondary | ICD-10-CM | POA: Diagnosis present

## 2018-11-12 DIAGNOSIS — S2241XA Multiple fractures of ribs, right side, initial encounter for closed fracture: Secondary | ICD-10-CM | POA: Diagnosis present

## 2018-11-12 DIAGNOSIS — S27329A Contusion of lung, unspecified, initial encounter: Secondary | ICD-10-CM

## 2018-11-12 DIAGNOSIS — Z419 Encounter for procedure for purposes other than remedying health state, unspecified: Secondary | ICD-10-CM

## 2018-11-12 DIAGNOSIS — E119 Type 2 diabetes mellitus without complications: Secondary | ICD-10-CM | POA: Diagnosis present

## 2018-11-12 DIAGNOSIS — J9811 Atelectasis: Secondary | ICD-10-CM | POA: Diagnosis present

## 2018-11-12 DIAGNOSIS — I10 Essential (primary) hypertension: Secondary | ICD-10-CM | POA: Diagnosis present

## 2018-11-12 DIAGNOSIS — K219 Gastro-esophageal reflux disease without esophagitis: Secondary | ICD-10-CM | POA: Diagnosis present

## 2018-11-12 DIAGNOSIS — Z8619 Personal history of other infectious and parasitic diseases: Secondary | ICD-10-CM | POA: Diagnosis not present

## 2018-11-12 DIAGNOSIS — Z1159 Encounter for screening for other viral diseases: Secondary | ICD-10-CM

## 2018-11-12 DIAGNOSIS — J939 Pneumothorax, unspecified: Secondary | ICD-10-CM

## 2018-11-12 DIAGNOSIS — S27321A Contusion of lung, unilateral, initial encounter: Secondary | ICD-10-CM | POA: Diagnosis present

## 2018-11-12 DIAGNOSIS — J9600 Acute respiratory failure, unspecified whether with hypoxia or hypercapnia: Secondary | ICD-10-CM

## 2018-11-12 DIAGNOSIS — F17210 Nicotine dependence, cigarettes, uncomplicated: Secondary | ICD-10-CM | POA: Diagnosis present

## 2018-11-12 DIAGNOSIS — J9601 Acute respiratory failure with hypoxia: Secondary | ICD-10-CM | POA: Diagnosis not present

## 2018-11-12 DIAGNOSIS — J942 Hemothorax: Secondary | ICD-10-CM

## 2018-11-12 DIAGNOSIS — S42101A Fracture of unspecified part of scapula, right shoulder, initial encounter for closed fracture: Secondary | ICD-10-CM

## 2018-11-12 DIAGNOSIS — S42023A Displaced fracture of shaft of unspecified clavicle, initial encounter for closed fracture: Secondary | ICD-10-CM

## 2018-11-12 DIAGNOSIS — Z881 Allergy status to other antibiotic agents status: Secondary | ICD-10-CM | POA: Diagnosis not present

## 2018-11-12 DIAGNOSIS — Z4682 Encounter for fitting and adjustment of non-vascular catheter: Secondary | ICD-10-CM

## 2018-11-12 HISTORY — DX: Essential (primary) hypertension: I10

## 2018-11-12 HISTORY — DX: Type 2 diabetes mellitus without complications: E11.9

## 2018-11-12 LAB — BPAM RBC
Blood Product Expiration Date: 202007022359
Blood Product Expiration Date: 202007092359
ISSUE DATE / TIME: 202006061851
ISSUE DATE / TIME: 202006061851
Unit Type and Rh: 5100
Unit Type and Rh: 5100

## 2018-11-12 LAB — I-STAT CHEM 8, ED
BUN: 22 mg/dL (ref 8–23)
Calcium, Ion: 1.07 mmol/L — ABNORMAL LOW (ref 1.15–1.40)
Chloride: 108 mmol/L (ref 98–111)
Creatinine, Ser: 1 mg/dL (ref 0.61–1.24)
Glucose, Bld: 265 mg/dL — ABNORMAL HIGH (ref 70–99)
HCT: 44 % (ref 39.0–52.0)
Hemoglobin: 15 g/dL (ref 13.0–17.0)
Potassium: 3.8 mmol/L (ref 3.5–5.1)
Sodium: 140 mmol/L (ref 135–145)
TCO2: 23 mmol/L (ref 22–32)

## 2018-11-12 LAB — PREPARE FRESH FROZEN PLASMA
Unit division: 0
Unit division: 0

## 2018-11-12 LAB — COMPREHENSIVE METABOLIC PANEL
ALT: 19 U/L (ref 0–44)
AST: 34 U/L (ref 15–41)
Albumin: 3.5 g/dL (ref 3.5–5.0)
Alkaline Phosphatase: 46 U/L (ref 38–126)
Anion gap: 11 (ref 5–15)
BUN: 22 mg/dL (ref 8–23)
CO2: 20 mmol/L — ABNORMAL LOW (ref 22–32)
Calcium: 8.8 mg/dL — ABNORMAL LOW (ref 8.9–10.3)
Chloride: 107 mmol/L (ref 98–111)
Creatinine, Ser: 1.19 mg/dL (ref 0.61–1.24)
GFR calc Af Amer: >60 mL/min (ref >60)
GFR calc non Af Amer: >60 mL/min (ref >60)
Glucose, Bld: 260 mg/dL — ABNORMAL HIGH (ref 70–99)
Potassium: 5 mmol/L (ref 3.5–5.1)
Sodium: 138 mmol/L (ref 135–145)
Total Bilirubin: 1 mg/dL (ref 0.3–1.2)
Total Protein: 6.2 g/dL — ABNORMAL LOW (ref 6.5–8.1)

## 2018-11-12 LAB — SARS CORONAVIRUS 2 BY RT PCR (HOSPITAL ORDER, PERFORMED IN ~~LOC~~ HOSPITAL LAB): SARS Coronavirus 2: NEGATIVE

## 2018-11-12 LAB — BPAM FFP
Blood Product Expiration Date: 202006102359
Blood Product Expiration Date: 202006102359
ISSUE DATE / TIME: 202006061851
ISSUE DATE / TIME: 202006061851
Unit Type and Rh: 6200
Unit Type and Rh: 6200

## 2018-11-12 LAB — CBC
HCT: 45.3 % (ref 39.0–52.0)
Hemoglobin: 15.2 g/dL (ref 13.0–17.0)
MCH: 30.5 pg (ref 26.0–34.0)
MCHC: 33.6 g/dL (ref 30.0–36.0)
MCV: 90.8 fL (ref 80.0–100.0)
Platelets: 326 10*3/uL (ref 150–400)
RBC: 4.99 MIL/uL (ref 4.22–5.81)
RDW: 12.2 % (ref 11.5–15.5)
WBC: 18.4 10*3/uL — ABNORMAL HIGH (ref 4.0–10.5)
nRBC: 0 % (ref 0.0–0.2)

## 2018-11-12 LAB — LACTIC ACID, PLASMA: Lactic Acid, Venous: 2.4 mmol/L (ref 0.5–1.9)

## 2018-11-12 LAB — TYPE AND SCREEN
ABO/RH(D): A POS
Antibody Screen: NEGATIVE
Unit division: 0
Unit division: 0

## 2018-11-12 LAB — ABO/RH: ABO/RH(D): A POS

## 2018-11-12 LAB — PROTIME-INR
INR: 1.1 (ref 0.8–1.2)
Prothrombin Time: 14.3 seconds (ref 11.4–15.2)

## 2018-11-12 LAB — CDS SEROLOGY

## 2018-11-12 LAB — CBG MONITORING, ED: Glucose-Capillary: 239 mg/dL — ABNORMAL HIGH (ref 70–99)

## 2018-11-12 LAB — ETHANOL: Alcohol, Ethyl (B): <10 mg/dL (ref <10)

## 2018-11-12 MED ORDER — ONDANSETRON HCL 4 MG/2ML IJ SOLN
4.0000 mg | Freq: Four times a day (QID) | INTRAMUSCULAR | Status: DC | PRN
  Administered 2018-11-13 – 2018-11-14 (×2): 4 mg via INTRAVENOUS
  Filled 2018-11-12: qty 2

## 2018-11-12 MED ORDER — IOHEXOL 300 MG/ML  SOLN
100.0000 mL | Freq: Once | INTRAMUSCULAR | Status: AC | PRN
  Administered 2018-11-12: 100 mL via INTRAVENOUS

## 2018-11-12 MED ORDER — ENOXAPARIN SODIUM 40 MG/0.4ML ~~LOC~~ SOLN
40.0000 mg | Freq: Every day | SUBCUTANEOUS | Status: DC
  Administered 2018-11-12 – 2018-11-21 (×10): 40 mg via SUBCUTANEOUS
  Filled 2018-11-12 (×10): qty 0.4

## 2018-11-12 MED ORDER — OXYCODONE HCL 5 MG PO TABS: 5.0000 mg | ORAL_TABLET | ORAL | Status: DC | PRN

## 2018-11-12 MED ORDER — METOPROLOL TARTRATE 5 MG/5ML IV SOLN
5.0000 mg | Freq: Four times a day (QID) | INTRAVENOUS | Status: DC | PRN
  Administered 2018-11-13 – 2018-11-15 (×3): 5 mg via INTRAVENOUS
  Filled 2018-11-12 (×3): qty 5

## 2018-11-12 MED ORDER — SODIUM CHLORIDE 0.9 % IV SOLN
INTRAVENOUS | Status: DC
  Administered 2018-11-12 – 2018-11-15 (×4): via INTRAVENOUS

## 2018-11-12 MED ORDER — OXYCODONE HCL 5 MG PO TABS
10.0000 mg | ORAL_TABLET | ORAL | Status: DC | PRN
  Administered 2018-11-12 – 2018-11-13 (×3): 10 mg via ORAL
  Filled 2018-11-12 (×3): qty 2

## 2018-11-12 MED ORDER — ACETAMINOPHEN 500 MG PO TABS
1000.0000 mg | ORAL_TABLET | Freq: Four times a day (QID) | ORAL | Status: DC
  Administered 2018-11-12 – 2018-11-22 (×35): 1000 mg via ORAL
  Filled 2018-11-12 (×36): qty 2

## 2018-11-12 MED ORDER — DOCUSATE SODIUM 100 MG PO CAPS
100.0000 mg | ORAL_CAPSULE | Freq: Two times a day (BID) | ORAL | Status: DC
  Administered 2018-11-12 – 2018-11-22 (×20): 100 mg via ORAL
  Filled 2018-11-12 (×20): qty 1

## 2018-11-12 MED ORDER — PANTOPRAZOLE SODIUM 40 MG PO TBEC
40.0000 mg | DELAYED_RELEASE_TABLET | Freq: Every day | ORAL | Status: DC
  Administered 2018-11-13 – 2018-11-22 (×10): 40 mg via ORAL
  Filled 2018-11-12 (×10): qty 1

## 2018-11-12 MED ORDER — MORPHINE SULFATE (PF) 2 MG/ML IV SOLN
1.0000 mg | INTRAVENOUS | Status: DC | PRN
  Administered 2018-11-12 – 2018-11-13 (×6): 2 mg via INTRAVENOUS
  Filled 2018-11-12 (×6): qty 1

## 2018-11-12 MED ORDER — FENTANYL CITRATE (PF) 100 MCG/2ML IJ SOLN
50.0000 ug | Freq: Once | INTRAMUSCULAR | Status: AC
  Administered 2018-11-12: 50 ug via INTRAVENOUS

## 2018-11-12 MED ORDER — INSULIN ASPART 100 UNIT/ML ~~LOC~~ SOLN
0.0000 [IU] | Freq: Three times a day (TID) | SUBCUTANEOUS | Status: DC
  Administered 2018-11-13: 7 [IU] via SUBCUTANEOUS
  Administered 2018-11-13 – 2018-11-14 (×2): 3 [IU] via SUBCUTANEOUS
  Administered 2018-11-15: 4 [IU] via SUBCUTANEOUS
  Administered 2018-11-15: 19:00:00 3 [IU] via SUBCUTANEOUS
  Administered 2018-11-15: 11 [IU] via SUBCUTANEOUS
  Administered 2018-11-16 (×2): 3 [IU] via SUBCUTANEOUS
  Administered 2018-11-18 – 2018-11-19 (×2): 4 [IU] via SUBCUTANEOUS
  Administered 2018-11-19: 18:00:00 3 [IU] via SUBCUTANEOUS
  Administered 2018-11-19 – 2018-11-20 (×2): 4 [IU] via SUBCUTANEOUS
  Administered 2018-11-20 – 2018-11-21 (×3): 3 [IU] via SUBCUTANEOUS
  Administered 2018-11-21 – 2018-11-22 (×3): 4 [IU] via SUBCUTANEOUS
  Administered 2018-11-22: 10:00:00 3 [IU] via SUBCUTANEOUS

## 2018-11-12 MED ORDER — IBUPROFEN 200 MG PO TABS
600.0000 mg | ORAL_TABLET | Freq: Three times a day (TID) | ORAL | Status: DC
  Administered 2018-11-12 – 2018-11-22 (×28): 600 mg via ORAL
  Filled 2018-11-12 (×29): qty 3

## 2018-11-12 MED ORDER — METHOCARBAMOL 750 MG PO TABS
750.0000 mg | ORAL_TABLET | Freq: Three times a day (TID) | ORAL | Status: DC | PRN
  Administered 2018-11-13 – 2018-11-14 (×4): 750 mg via ORAL
  Filled 2018-11-12 (×4): qty 1

## 2018-11-12 MED ORDER — SODIUM CHLORIDE 0.9 % IV BOLUS
1000.0000 mL | Freq: Once | INTRAVENOUS | Status: AC
  Administered 2018-11-12: 19:00:00 1000 mL via INTRAVENOUS

## 2018-11-12 MED ORDER — FENTANYL CITRATE (PF) 100 MCG/2ML IJ SOLN
INTRAMUSCULAR | Status: AC
  Filled 2018-11-12: qty 2

## 2018-11-12 MED ORDER — ONDANSETRON 4 MG PO TBDP
4.0000 mg | ORAL_TABLET | Freq: Four times a day (QID) | ORAL | Status: DC | PRN
  Administered 2018-11-13: 4 mg via ORAL
  Filled 2018-11-12: qty 1

## 2018-11-12 MED ORDER — HYDRALAZINE HCL 20 MG/ML IJ SOLN
10.0000 mg | INTRAMUSCULAR | Status: DC | PRN
  Administered 2018-11-20: 10 mg via INTRAVENOUS
  Filled 2018-11-12: qty 1
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
## 2018-11-12 NOTE — ED Notes (Signed)
[facility]thopedic and Neuro Surgery paged when level 1 activated. No response from either surgeon.

## 2018-11-12 NOTE — ED Notes (Signed)
Pt transported to CT ?

## 2018-11-12 NOTE — ED Notes (Signed)
CALLED DR NORRIS (Ortho)  FOR DR Maggie Font
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
## 2018-11-12 NOTE — ED Notes (Signed)
Dr. Reather Converse notified of Lactic Acid 2.4.
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
## 2018-11-12 NOTE — ED Notes (Addendum)
Patient was encoded with BP of 93, HR 55, alert and oriented. Upon arrival [facility] reports that patients initial systolic BP 88. Dr. Reather Converse requested Trauma be upgraded to Level 1
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
## 2018-11-12 NOTE — ED Notes (Signed)
C-collar removed per Dr.Dillon

## 2018-11-12 NOTE — ED Notes (Signed)
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          Vitals/Pain Today's Vitals   11/12/18 2000 11/12/18 2035 11/12/18 2130 11/12/18 2132  BP: (!) 150/86 (!) 163/86 (!) 146/85   Pulse: (!) 49  63   Resp: (!) 21 20 20    Temp:      TempSrc:      SpO2: 98%  97%   Weight:      Height:      PainSc:    10-Worst pain ever    Isolation Precautions No active isolations  Medications Medications  sodium chloride 0.9 % bolus 1,000 mL (  0 mLs Intravenous Stopped 11/12/18 2037)    And  0.9 %  sodium chloride infusion (has no administration in time range)  morphine 2 MG/ML injection 1-2 mg (2 mg Intravenous Given 11/12/18 2132)  fentaNYL (SUBLIMAZE) injection 50 mcg (50 mcg Intravenous Given 11/12/18 1911)  iohexol (OMNIPAQUE) 300 MG/ML solution 100 mL (100 mLs Intravenous Contrast Given 11/12/18 1926)    Mobility walks Low fall risk   Focused Assessments Trauma assessment  R Recommendations: See Admitting Provider Note  Report given to:   Additional Notes:

## 2018-11-12 NOTE — H&P (Addendum)
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 GCS verbal subscore is 5. GCS motor subscore is 6.  Skin: Skin is warm and dry. Abrasion noted. He is not diaphoretic.  Psychiatric: He has a normal mood and affect. His speech is normal and behavior is normal.     Assessment/Plan [facility] Pneumomediastinum Small right hemopneumothorax Right middle lobe/right upper lobe pulmonary contusion Right rib fractures 1 through 7 Comminuted right clavicle fracture Right scapular fracture Multiple abrasions Diabetes mellitus Hypertension H/o hep C - completed treatment course  Admit to progressive care unit Pain control for rib fractures Pulmonary toilet We will do sliding scale insulin for now until we get a list of his home meds Sling for right upper extremity Orthopedic consult for bony fractures -discussed with Dr. Alma Friendly; more than likely will need surgery for right clavicle early next week per Ortho Repeat chest x-ray in the a.m. Chemical DVT prophylaxis  Leighton Ruff. Redmond Pulling, MD, FACS General, Bariatric, & Minimally Invasive Surgery [facility]    Greer Pickerel 11/12/2018, 8:30 PM   Procedures

## 2018-11-13 ENCOUNTER — Inpatient Hospital Stay (HOSPITAL_COMMUNITY): Payer: Medicare Other

## 2018-11-13 LAB — URINALYSIS, ROUTINE W REFLEX MICROSCOPIC
Bacteria, UA: NONE SEEN
Bilirubin Urine: NEGATIVE
Glucose, UA: NEGATIVE mg/dL
Hgb urine dipstick: NEGATIVE
Ketones, ur: NEGATIVE mg/dL
Leukocytes,Ua: NEGATIVE
Nitrite: NEGATIVE
Protein, ur: 100 mg/dL — AB
Specific Gravity, Urine: 1.035 — ABNORMAL HIGH (ref 1.005–1.030)
pH: 5 (ref 5.0–8.0)

## 2018-11-13 LAB — COMPREHENSIVE METABOLIC PANEL
ALT: 23 U/L (ref 0–44)
AST: 41 U/L (ref 15–41)
Albumin: 3.1 g/dL — ABNORMAL LOW (ref 3.5–5.0)
Alkaline Phosphatase: 42 U/L (ref 38–126)
Anion gap: 11 (ref 5–15)
BUN: 20 mg/dL (ref 8–23)
CO2: 20 mmol/L — ABNORMAL LOW (ref 22–32)
Calcium: 8.4 mg/dL — ABNORMAL LOW (ref 8.9–10.3)
Chloride: 108 mmol/L (ref 98–111)
Creatinine, Ser: 0.92 mg/dL (ref 0.61–1.24)
GFR calc Af Amer: >60 mL/min (ref >60)
GFR calc non Af Amer: >60 mL/min (ref >60)
Glucose, Bld: 249 mg/dL — ABNORMAL HIGH (ref 70–99)
Potassium: 4.2 mmol/L (ref 3.5–5.1)
Sodium: 139 mmol/L (ref 135–145)
Total Bilirubin: 0.8 mg/dL (ref 0.3–1.2)
Total Protein: 5.8 g/dL — ABNORMAL LOW (ref 6.5–8.1)

## 2018-11-13 LAB — HEMOGLOBIN A1C
Hgb A1c MFr Bld: 8.3 % — ABNORMAL HIGH (ref 4.8–5.6)
Mean Plasma Glucose: 191.51 mg/dL

## 2018-11-13 LAB — CBC
HCT: 39.8 % (ref 39.0–52.0)
Hemoglobin: 13.3 g/dL (ref 13.0–17.0)
MCH: 30 pg (ref 26.0–34.0)
MCHC: 33.4 g/dL (ref 30.0–36.0)
MCV: 89.8 fL (ref 80.0–100.0)
Platelets: 272 10*3/uL (ref 150–400)
RBC: 4.43 MIL/uL (ref 4.22–5.81)
RDW: 12.3 % (ref 11.5–15.5)
WBC: 22.7 10*3/uL — ABNORMAL HIGH (ref 4.0–10.5)
nRBC: 0 % (ref 0.0–0.2)

## 2018-11-13 LAB — MRSA PCR SCREENING: MRSA by PCR: NEGATIVE

## 2018-11-13 LAB — GLUCOSE, CAPILLARY
Glucose-Capillary: 237 mg/dL — ABNORMAL HIGH (ref 70–99)
Glucose-Capillary: 212 mg/dL — ABNORMAL HIGH (ref 70–99)
Glucose-Capillary: 149 mg/dL — ABNORMAL HIGH (ref 70–99)
Glucose-Capillary: 101 mg/dL — ABNORMAL HIGH (ref 70–99)

## 2018-11-13 MED ORDER — ORAL CARE MOUTH RINSE
15.0000 mL | Freq: Two times a day (BID) | OROMUCOSAL | Status: DC
  Administered 2018-11-13 – 2018-11-21 (×15): 15 mL via OROMUCOSAL

## 2018-11-13 MED ORDER — NICOTINE 14 MG/24HR TD PT24
14.0000 mg | MEDICATED_PATCH | Freq: Every day | TRANSDERMAL | Status: DC
  Administered 2018-11-13 – 2018-11-15 (×3): 14 mg via TRANSDERMAL
  Filled 2018-11-13 (×3): qty 1

## 2018-11-13 MED ORDER — BACITRACIN ZINC 500 UNIT/GM EX OINT
TOPICAL_OINTMENT | Freq: Two times a day (BID) | CUTANEOUS | Status: DC
  Administered 2018-11-13 – 2018-11-14 (×3): via TOPICAL
  Administered 2018-11-15: 1 via TOPICAL
  Administered 2018-11-15 – 2018-11-22 (×14): via TOPICAL
  Filled 2018-11-13 (×2): qty 28.4

## 2018-11-13 MED ORDER — HYDROMORPHONE HCL 1 MG/ML IJ SOLN
1.0000 mg | INTRAMUSCULAR | Status: DC | PRN
  Administered 2018-11-13 – 2018-11-16 (×20): 1 mg via INTRAVENOUS
  Filled 2018-11-13 (×20): qty 1

## 2018-11-13 MED ORDER — INSULIN GLARGINE (1 UNIT DIAL) 300 UNIT/ML ~~LOC~~ SOPN: 50.0000 [IU] | PEN_INJECTOR | Freq: Every day | SUBCUTANEOUS | Status: DC

## 2018-11-13 MED ORDER — INSULIN GLARGINE 100 UNIT/ML ~~LOC~~ SOLN
50.0000 [IU] | Freq: Every day | SUBCUTANEOUS | Status: DC
  Administered 2018-11-13 – 2018-11-17 (×4): 50 [IU] via SUBCUTANEOUS
  Filled 2018-11-13 (×7): qty 0.5

## 2018-11-13 MED ORDER — OXYCODONE HCL 5 MG PO TABS
5.0000 mg | ORAL_TABLET | ORAL | Status: DC | PRN
  Administered 2018-11-13 – 2018-11-17 (×10): 10 mg via ORAL
  Filled 2018-11-13 (×10): qty 2

## 2018-11-13 MED ORDER — GABAPENTIN 600 MG PO TABS
300.0000 mg | ORAL_TABLET | Freq: Three times a day (TID) | ORAL | Status: DC
  Administered 2018-11-13 – 2018-11-18 (×17): 300 mg via ORAL
  Filled 2018-11-13 (×18): qty 1

## 2018-11-13 MED ORDER — POLYETHYLENE GLYCOL 3350 17 G PO PACK
17.0000 g | PACK | Freq: Every day | ORAL | Status: DC
  Administered 2018-11-13 – 2018-11-21 (×6): 17 g via ORAL
  Filled 2018-11-13 (×8): qty 1

## 2018-11-13 MED ORDER — TRAMADOL HCL 50 MG PO TABS
50.0000 mg | ORAL_TABLET | Freq: Four times a day (QID) | ORAL | Status: DC
  Administered 2018-11-13 – 2018-11-19 (×26): 50 mg via ORAL
  Filled 2018-11-13 (×28): qty 1

## 2018-11-13 MED ORDER — SERTRALINE HCL 100 MG PO TABS
100.0000 mg | ORAL_TABLET | Freq: Every day | ORAL | Status: DC
  Administered 2018-11-14 – 2018-11-22 (×9): 100 mg via ORAL
  Filled 2018-11-13 (×10): qty 1

## 2018-11-13 MED ORDER — ALUM & MAG HYDROXIDE-SIMETH 200-200-20 MG/5ML PO SUSP
30.0000 mL | Freq: Four times a day (QID) | ORAL | Status: DC | PRN
  Administered 2018-11-13 – 2018-11-15 (×3): 30 mL via ORAL
  Filled 2018-11-13 (×3): qty 30
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
## 2018-11-13 NOTE — Progress Notes (Signed)
Orthopedic Tech Progress Note Patient Details:  Charles Manning 1956/03/19 [id number] RN called requesting arm sling. And said they would apply it. Ortho Devices Type of Ortho Device: Arm sling Ortho Device/Splint Location: URE Ortho Device/Splint Interventions: Other (comment)   Post Interventions Instructions Provided: Adjustment of device, Care of device   Janit Pagan 11/13/2018, 7:14 PM

## 2018-11-13 NOTE — Progress Notes (Signed)
Subjective: Patient reports breathing to be uncomfortable but does not feel SOB. Reports right shoulder pain. Denies numbness and tingling in the right UE.    Objective: Vital signs in last 24 hours: Temp:  [98.1 F (36.7 C)-99.1 F (37.3 C)] 99.1 F (37.3 C) (06/07 0827) Pulse Rate:  [49-66] 66 (06/07 0600) Resp:  [16-25] 20 (06/07 0600) BP: (88-180)/(52-99) 167/85 (06/07 0827) SpO2:  [89 %-98 %] 94 % (06/07 0600) Weight:  [86.2 kg] 86.2 kg (06/06 1849)  Intake/Output from previous day: 06/06 0701 - 06/07 0700 In: 3276 [I.V.:3276] Out: 425 [Urine:425] Intake/Output this shift: No intake/output data recorded.  Recent Labs    11/12/18 1851 11/12/18 1859 11/13/18 0834  HGB 15.2 15.0 13.3   Recent Labs    11/12/18 1851 11/12/18 1859 11/13/18 0834  WBC 18.4*  --  22.7*  RBC 4.99  --  4.43  HCT 45.3 44.0 39.8  PLT 326  --  272   Recent Labs    11/12/18 1851 11/12/18 1859  NA 138 140  K 5.0 3.8  CL 107 108  CO2 20*  --   BUN 22 22  CREATININE 1.19 1.00  GLUCOSE 260* 265*  CALCIUM 8.8*  --    Recent Labs    11/12/18 1851  INR 1.1    Right shoulder with abrasion over the deltoid. Clavicle is tender to palpation, right arm with several other abrasions, but no gross deformity and relatively pain free hand, wrist, and elbow ROM Left shoulder and arm with no tenderness and no deformity, NVI Right LE with abrasion over the prepatellar area, but normal knee alignment and stable knee to lig exam  Assessment/Plan: Comminuted but minimally displaced and no significantly shortened right clavicle fracture Minimally displaced and comminuted right scapular body fracture  Multiple displaced rib fractures on the right side  Pain management Monitor breathing Bed rest Awaiting ortho trauma consult for possible surgical intervention  Sharonann Malbrough L Niamya Vittitow 11/13/2018, 9:20 AM

## 2018-11-13 NOTE — Progress Notes (Signed)
RT note: Instructed patient with use of Flutter valve using the teach back method. Patient displayed proper use and understanding.

## 2018-11-14 ENCOUNTER — Inpatient Hospital Stay (HOSPITAL_COMMUNITY): Payer: Medicare Other

## 2018-11-14 ENCOUNTER — Encounter (HOSPITAL_COMMUNITY): Admission: EM | Disposition: A | Discharge status: Home Health | Payer: Self-pay | Source: Home / Self Care

## 2018-11-14 ENCOUNTER — Inpatient Hospital Stay (HOSPITAL_COMMUNITY): Payer: Medicare Other | Admitting: Certified Registered Nurse Anesthetist

## 2018-11-14 HISTORY — PX: ORIF CLAVICULAR FRACTURE: SHX5055

## 2018-11-14 LAB — GLUCOSE, CAPILLARY
Glucose-Capillary: 88 mg/dL (ref 70–99)
Glucose-Capillary: 107 mg/dL — ABNORMAL HIGH (ref 70–99)
Glucose-Capillary: 119 mg/dL — ABNORMAL HIGH (ref 70–99)
Glucose-Capillary: 131 mg/dL — ABNORMAL HIGH (ref 70–99)
Glucose-Capillary: 195 mg/dL — ABNORMAL HIGH (ref 70–99)

## 2018-11-14 LAB — BASIC METABOLIC PANEL
Anion gap: 9 (ref 5–15)
BUN: 21 mg/dL (ref 8–23)
CO2: 20 mmol/L — ABNORMAL LOW (ref 22–32)
Calcium: 8.4 mg/dL — ABNORMAL LOW (ref 8.9–10.3)
Chloride: 111 mmol/L (ref 98–111)
Creatinine, Ser: 0.93 mg/dL (ref 0.61–1.24)
GFR calc Af Amer: >60 mL/min (ref >60)
GFR calc non Af Amer: >60 mL/min (ref >60)
Glucose, Bld: 117 mg/dL — ABNORMAL HIGH (ref 70–99)
Potassium: 4.1 mmol/L (ref 3.5–5.1)
Sodium: 140 mmol/L (ref 135–145)

## 2018-11-14 LAB — CBC
HCT: 37.5 % — ABNORMAL LOW (ref 39.0–52.0)
Hemoglobin: 12.4 g/dL — ABNORMAL LOW (ref 13.0–17.0)
MCH: 30.2 pg (ref 26.0–34.0)
MCHC: 33.1 g/dL (ref 30.0–36.0)
MCV: 91.5 fL (ref 80.0–100.0)
Platelets: 226 10*3/uL (ref 150–400)
RBC: 4.1 MIL/uL — ABNORMAL LOW (ref 4.22–5.81)
RDW: 12.4 % (ref 11.5–15.5)
WBC: 20.1 10*3/uL — ABNORMAL HIGH (ref 4.0–10.5)
nRBC: 0 % (ref 0.0–0.2)

## 2018-11-14 LAB — BLOOD PRODUCT ORDER (VERBAL) VERIFICATION

## 2018-11-14 LAB — HIV ANTIBODY (ROUTINE TESTING W REFLEX): HIV Screen 4th Generation wRfx: NONREACTIVE

## 2018-11-14 SURGERY — OPEN REDUCTION INTERNAL FIXATION (ORIF) CLAVICULAR FRACTURE
Anesthesia: General | Laterality: Right

## 2018-11-14 MED ORDER — LIDOCAINE 2% (20 MG/ML) 5 ML SYRINGE
INTRAMUSCULAR | Status: AC
  Filled 2018-11-14: qty 5

## 2018-11-14 MED ORDER — TOBRAMYCIN SULFATE 1.2 G IJ SOLR
INTRAMUSCULAR | Status: AC
  Filled 2018-11-14: qty 1.2

## 2018-11-14 MED ORDER — LABETALOL HCL 5 MG/ML IV SOLN
INTRAVENOUS | Status: DC | PRN
  Administered 2018-11-14 (×2): 5 mg via INTRAVENOUS

## 2018-11-14 MED ORDER — ONDANSETRON HCL 4 MG/2ML IJ SOLN
INTRAMUSCULAR | Status: AC
  Filled 2018-11-14: qty 6

## 2018-11-14 MED ORDER — SUCCINYLCHOLINE CHLORIDE 200 MG/10ML IV SOSY
PREFILLED_SYRINGE | INTRAVENOUS | Status: AC
  Filled 2018-11-14: qty 10

## 2018-11-14 MED ORDER — FENTANYL CITRATE (PF) 250 MCG/5ML IJ SOLN
INTRAMUSCULAR | Status: DC | PRN
  Administered 2018-11-14 (×2): 50 ug via INTRAVENOUS
  Administered 2018-11-14: 150 ug via INTRAVENOUS
  Administered 2018-11-14: 25 ug via INTRAVENOUS

## 2018-11-14 MED ORDER — TOBRAMYCIN SULFATE 1.2 G IJ SOLR
INTRAMUSCULAR | Status: DC | PRN
  Administered 2018-11-14: 1.2 g via TOPICAL

## 2018-11-14 MED ORDER — MIDAZOLAM HCL 2 MG/2ML IJ SOLN
INTRAMUSCULAR | Status: AC
  Filled 2018-11-14: qty 2

## 2018-11-14 MED ORDER — METHOCARBAMOL 750 MG PO TABS
750.0000 mg | ORAL_TABLET | Freq: Three times a day (TID) | ORAL | Status: DC
  Administered 2018-11-14 – 2018-11-16 (×8): 750 mg via ORAL
  Filled 2018-11-14 (×8): qty 1

## 2018-11-14 MED ORDER — EPHEDRINE SULFATE-NACL 50-0.9 MG/10ML-% IV SOSY
PREFILLED_SYRINGE | INTRAVENOUS | Status: DC | PRN
  Administered 2018-11-14: 15 mg via INTRAVENOUS

## 2018-11-14 MED ORDER — DEXAMETHASONE SODIUM PHOSPHATE 10 MG/ML IJ SOLN
INTRAMUSCULAR | Status: DC | PRN
  Administered 2018-11-14: 5 mg via INTRAVENOUS

## 2018-11-14 MED ORDER — PROPOFOL 10 MG/ML IV BOLUS
INTRAVENOUS | Status: DC | PRN
  Administered 2018-11-14: 100 mg via INTRAVENOUS

## 2018-11-14 MED ORDER — CEFAZOLIN SODIUM-DEXTROSE 2-3 GM-%(50ML) IV SOLR
INTRAVENOUS | Status: DC | PRN
  Administered 2018-11-14: 2 g via INTRAVENOUS

## 2018-11-14 MED ORDER — ROCURONIUM BROMIDE 10 MG/ML (PF) SYRINGE
PREFILLED_SYRINGE | INTRAVENOUS | Status: DC | PRN
  Administered 2018-11-14: 50 mg via INTRAVENOUS

## 2018-11-14 MED ORDER — CEFAZOLIN SODIUM 1 G IJ SOLR
INTRAMUSCULAR | Status: AC
  Filled 2018-11-14: qty 20

## 2018-11-14 MED ORDER — FENTANYL CITRATE (PF) 100 MCG/2ML IJ SOLN
INTRAMUSCULAR | Status: AC
  Filled 2018-11-14: qty 2

## 2018-11-14 MED ORDER — ROCURONIUM BROMIDE 10 MG/ML (PF) SYRINGE
PREFILLED_SYRINGE | INTRAVENOUS | Status: AC
  Filled 2018-11-14: qty 10

## 2018-11-14 MED ORDER — DEXAMETHASONE SODIUM PHOSPHATE 10 MG/ML IJ SOLN
INTRAMUSCULAR | Status: AC
  Filled 2018-11-14: qty 2

## 2018-11-14 MED ORDER — FENTANYL CITRATE (PF) 250 MCG/5ML IJ SOLN
INTRAMUSCULAR | Status: AC
  Filled 2018-11-14: qty 5

## 2018-11-14 MED ORDER — PHENYLEPHRINE 40 MCG/ML (10ML) SYRINGE FOR IV PUSH (FOR BLOOD PRESSURE SUPPORT)
PREFILLED_SYRINGE | INTRAVENOUS | Status: AC
  Filled 2018-11-14: qty 10

## 2018-11-14 MED ORDER — BACITRACIN ZINC 500 UNIT/GM EX OINT
TOPICAL_OINTMENT | CUTANEOUS | Status: AC
  Filled 2018-11-14: qty 28.35

## 2018-11-14 MED ORDER — ALBUTEROL SULFATE (2.5 MG/3ML) 0.083% IN NEBU
2.5000 mg | INHALATION_SOLUTION | Freq: Four times a day (QID) | RESPIRATORY_TRACT | Status: DC | PRN
  Administered 2018-11-14: 2.5 mg via RESPIRATORY_TRACT

## 2018-11-14 MED ORDER — 0.9 % SODIUM CHLORIDE (POUR BTL) OPTIME
TOPICAL | Status: DC | PRN
  Administered 2018-11-14: 1000 mL

## 2018-11-14 MED ORDER — GUAIFENESIN ER 600 MG PO TB12
600.0000 mg | ORAL_TABLET | Freq: Two times a day (BID) | ORAL | Status: DC
  Administered 2018-11-14 – 2018-11-22 (×16): 600 mg via ORAL
  Filled 2018-11-14 (×16): qty 1

## 2018-11-14 MED ORDER — LIDOCAINE 2% (20 MG/ML) 5 ML SYRINGE
INTRAMUSCULAR | Status: DC | PRN
  Administered 2018-11-14: 60 mg via INTRAVENOUS

## 2018-11-14 MED ORDER — LISINOPRIL 10 MG PO TABS
10.0000 mg | ORAL_TABLET | Freq: Every day | ORAL | Status: DC
  Administered 2018-11-15 – 2018-11-22 (×8): 10 mg via ORAL
  Filled 2018-11-14 (×8): qty 1

## 2018-11-14 MED ORDER — LACTATED RINGERS IV SOLN
INTRAVENOUS | Status: DC
  Administered 2018-11-14: 13:00:00 via INTRAVENOUS

## 2018-11-14 MED ORDER — SUCCINYLCHOLINE CHLORIDE 200 MG/10ML IV SOSY
PREFILLED_SYRINGE | INTRAVENOUS | Status: DC | PRN
  Administered 2018-11-14: 100 mg via INTRAVENOUS

## 2018-11-14 MED ORDER — ALBUTEROL SULFATE (2.5 MG/3ML) 0.083% IN NEBU
INHALATION_SOLUTION | RESPIRATORY_TRACT | Status: AC
  Filled 2018-11-14: qty 3

## 2018-11-14 MED ORDER — SUGAMMADEX SODIUM 200 MG/2ML IV SOLN
INTRAVENOUS | Status: DC | PRN
  Administered 2018-11-14: 200 mg via INTRAVENOUS

## 2018-11-14 MED ORDER — EPHEDRINE 5 MG/ML INJ
INTRAVENOUS | Status: AC
  Filled 2018-11-14: qty 10

## 2018-11-14 MED ORDER — BUPIVACAINE-EPINEPHRINE (PF) 0.5% -1:200000 IJ SOLN
INTRAMUSCULAR | Status: AC
  Filled 2018-11-14: qty 30

## 2018-11-14 MED ORDER — PROPOFOL 10 MG/ML IV BOLUS
INTRAVENOUS | Status: AC
  Filled 2018-11-14: qty 20

## 2018-11-14 MED ORDER — BACITRACIN 500 UNIT/GM EX OINT
TOPICAL_OINTMENT | CUTANEOUS | Status: DC | PRN
  Administered 2018-11-14: 1 via TOPICAL

## 2018-11-14 MED ORDER — ROCURONIUM BROMIDE 10 MG/ML (PF) SYRINGE
PREFILLED_SYRINGE | INTRAVENOUS | Status: AC
  Filled 2018-11-14: qty 30

## 2018-11-14 MED ORDER — LABETALOL HCL 5 MG/ML IV SOLN
INTRAVENOUS | Status: AC
  Filled 2018-11-14: qty 4

## 2018-11-14 MED ORDER — FENTANYL CITRATE (PF) 100 MCG/2ML IJ SOLN
25.0000 ug | Freq: Once | INTRAMUSCULAR | Status: AC
  Administered 2018-11-14: 11:00:00 25 ug via INTRAVENOUS

## 2018-11-14 MED ORDER — CEFAZOLIN SODIUM-DEXTROSE 2-4 GM/100ML-% IV SOLN
2.0000 g | Freq: Three times a day (TID) | INTRAVENOUS | Status: AC
  Administered 2018-11-14 – 2018-11-15 (×3): 2 g via INTRAVENOUS
  Filled 2018-11-14 (×4): qty 100

## 2018-11-14 SURGICAL SUPPLY — 57 items
ADH SKN CLS APL DERMABOND .7 (GAUZE/BANDAGES/DRESSINGS) ×1
BIT DRILL 2.5X110 QC LCP DISP (BIT) ×2 IMPLANT
BNDG COHESIVE 4X5 TAN STRL (GAUZE/BANDAGES/DRESSINGS) IMPLANT
BRUSH SCRUB SURG 4.25 DISP (MISCELLANEOUS) ×6 IMPLANT
CHLORAPREP W/TINT 26ML (MISCELLANEOUS) ×3 IMPLANT
COVER SURGICAL LIGHT HANDLE (MISCELLANEOUS) ×6 IMPLANT
COVER WAND RF STERILE (DRAPES) ×3 IMPLANT
DERMABOND ADVANCED (GAUZE/BANDAGES/DRESSINGS) ×2
DERMABOND ADVANCED .7 DNX12 (GAUZE/BANDAGES/DRESSINGS) ×2 IMPLANT
DRAPE C-ARM 42X72 X-RAY (DRAPES) ×3 IMPLANT
DRAPE INCISE IOBAN 66X45 STRL (DRAPES) ×3 IMPLANT
DRAPE ORTHO SPLIT 77X108 STRL (DRAPES) ×6
DRAPE SURG ORHT 6 SPLT 77X108 (DRAPES) ×2 IMPLANT
DRAPE U-SHAPE 47X51 STRL (DRAPES) ×6 IMPLANT
DRSG MEPILEX BORDER 4X8 (GAUZE/BANDAGES/DRESSINGS) ×3 IMPLANT
ELECT REM PT RETURN 9FT ADLT (ELECTROSURGICAL) ×3
ELECTRODE REM PT RTRN 9FT ADLT (ELECTROSURGICAL) ×1 IMPLANT
GLOVE BIO SURGEON STRL SZ 6.5 (GLOVE) ×6 IMPLANT
GLOVE BIO SURGEON STRL SZ7.5 (GLOVE) ×9 IMPLANT
GLOVE BIO SURGEONS STRL SZ 6.5 (GLOVE) ×3
GLOVE BIOGEL PI IND STRL 6.5 (GLOVE) ×1 IMPLANT
GLOVE BIOGEL PI IND STRL 7.5 (GLOVE) ×1 IMPLANT
GLOVE BIOGEL PI INDICATOR 6.5 (GLOVE) ×2
GLOVE BIOGEL PI INDICATOR 7.5 (GLOVE) ×2
GOWN STRL REUS W/ TWL LRG LVL3 (GOWN DISPOSABLE) ×2 IMPLANT
GOWN STRL REUS W/TWL LRG LVL3 (GOWN DISPOSABLE) ×6
KIT BASIN OR (CUSTOM PROCEDURE TRAY) ×3 IMPLANT
KIT TURNOVER KIT B (KITS) ×3 IMPLANT
MANIFOLD NEPTUNE II (INSTRUMENTS) ×3 IMPLANT
NDL HYPO 25GX1X1/2 BEV (NEEDLE) IMPLANT
NEEDLE HYPO 25GX1X1/2 BEV (NEEDLE) IMPLANT
NS IRRIG 1000ML POUR BTL (IV SOLUTION) ×3 IMPLANT
PACK GENERAL/GYN (CUSTOM PROCEDURE TRAY) ×3 IMPLANT
PAD ARMBOARD 7.5X6 YLW CONV (MISCELLANEOUS) ×6 IMPLANT
PROS LCP PLATE 9H 124M (Plate) ×3 IMPLANT
PROSTHESIS LCP PLATE 9H 124M (Plate) IMPLANT
SCREW CORTEX 3.5 16MM (Screw) ×2 IMPLANT
SCREW CORTEX 3.5 18MM (Screw) ×4 IMPLANT
SCREW CORTEX 3.5 20MM (Screw) ×8 IMPLANT
SCREW LOCK CORT ST 3.5X16 (Screw) IMPLANT
SCREW LOCK CORT ST 3.5X18 (Screw) IMPLANT
SCREW LOCK CORT ST 3.5X20 (Screw) IMPLANT
SLING ARM IMMOBILIZER LRG (SOFTGOODS) ×2 IMPLANT
SLING ARM IMMOBILIZER MED (SOFTGOODS) IMPLANT
STAPLER VISISTAT 35W (STAPLE) ×3 IMPLANT
STOCKINETTE IMPERVIOUS 9X36 MD (GAUZE/BANDAGES/DRESSINGS) IMPLANT
SUCTION FRAZIER HANDLE 10FR (MISCELLANEOUS) ×2
SUCTION TUBE FRAZIER 10FR DISP (MISCELLANEOUS) ×1 IMPLANT
SUT MNCRL AB 3-0 PS2 18 (SUTURE) ×3 IMPLANT
SUT MNCRL AB 3-0 PS2 27 (SUTURE) ×3 IMPLANT
SUT VIC AB 0 CT1 27 (SUTURE) ×3
SUT VIC AB 0 CT1 27XBRD ANBCTR (SUTURE) ×1 IMPLANT
SUT VIC AB 2-0 CT1 27 (SUTURE) ×3
SUT VIC AB 2-0 CT1 TAPERPNT 27 (SUTURE) ×1 IMPLANT
SYR CONTROL 10ML LL (SYRINGE) IMPLANT
TOWEL OR 17X26 10 PK STRL BLUE (TOWEL DISPOSABLE) ×3 IMPLANT
WATER STERILE IRR 1000ML POUR (IV SOLUTION) ×3 IMPLANT

## 2018-11-14 NOTE — Progress Notes (Signed)
Dr. Ermalene Postin and Claiborne Billings, Trauma PA notified of results of CXR.
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
## 2018-11-14 NOTE — Anesthesia Postprocedure Evaluation (Signed)
Anesthesia Post Note  Patient: Charles Manning  Procedure(s) Performed: OPEN REDUCTION INTERNAL FIXATION (ORIF) CLAVICULAR FRACTURE (Right )     Patient location during evaluation: PACU Anesthesia Type: General Level of consciousness: awake and alert Pain management: pain level controlled Vital Signs Assessment: post-procedure vital signs reviewed and stable Respiratory status: spontaneous breathing, nonlabored ventilation, respiratory function stable and patient connected to face mask oxygen Cardiovascular status: blood pressure returned to baseline and stable Postop Assessment: no apparent nausea or vomiting Anesthetic complications: no    Last Vitals:  Vitals:   11/14/18 1600 11/14/18 1615  BP: (!) 149/84 (!) 165/93  Pulse: (!) 59 (!) 58  Resp: 17 19  Temp:    SpO2: 96% 95%    Last Pain:  Vitals:   11/14/18 1615  TempSrc:   PainSc: Asleep                 Mickala Laton,W. EDMOND

## 2018-11-14 NOTE — Transfer of Care (Signed)
Immediate Anesthesia Transfer of Care Note  Patient: Charles Manning  Procedure(s) Performed: OPEN REDUCTION INTERNAL FIXATION (ORIF) CLAVICULAR FRACTURE (Right )  Patient Location: PACU  Anesthesia Type:General  Level of Consciousness: drowsy and patient cooperative  Airway & Oxygen Therapy: Patient Spontanous Breathing and non-rebreather face mask  Post-op Assessment: Report given to RN and Post -op Vital signs reviewed and stable  Post vital signs: Reviewed and stable  Last Vitals:  Vitals Value Taken Time  BP 167/88 11/14/2018  3:13 PM  Temp    Pulse 64 11/14/2018  3:16 PM  Resp 16 11/14/2018  3:16 PM  SpO2 95 % 11/14/2018  3:16 PM  Vitals shown include unvalidated device data.  Last Pain:  Vitals:   11/14/18 0816  TempSrc: Oral  PainSc:       Patients Stated Pain Goal: 0 (10/62/69 4854)  Complications: No apparent anesthesia complications

## 2018-11-14 NOTE — Procedures (Signed)
Chest Tube Insertion Procedure Note  Indications:  Clinically significant R pneumothorax  Pre-operative Diagnosis: R pneumothorax  Post-operative Diagnosis: R pneumothorax  Surgeon: Georganna Skeans, MD  Assistant: Brigid Re PA-C  Procedure Details  Informed consent was obtained for the procedure, including sedation.  Risks of lung perforation, hemorrhage, arrhythmia, and adverse drug reaction were discussed.   After sterile skin prep, using standard technique, a 14 French tube was placed in the R anterior axillary line above the nipple level using Seldinger technique. It was connected to a Pleurevac and sutured.A sterile dressing was applied.  Findings: Air and blood  Estimated Blood Loss:  450 into Pleurevac         Specimens:  none              Complications:  None; patient tolerated the procedure well.         Disposition: 4NP         Condition: stable  Georganna Skeans, MD, MPH, FACS Trauma & General Surgery: [phone]

## 2018-11-14 NOTE — Progress Notes (Signed)
Patient arrives to Short Stay alert and oriented x 4. Chest tube in place. Chest tube placed on suction without evidence of air leak. Patient sinus brady on monitor (56). SpO2 initially 91% with [place] partially out of nose. [place] placed back inpatient's nose and placed on 3 lpm with increase to 93%.
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
## 2018-11-14 NOTE — Anesthesia Procedure Notes (Signed)
Procedure Name: Intubation Date/Time: 11/14/2018 1:23 PM Performed by: Elayne Snare, CRNA Pre-anesthesia Checklist: Patient identified, Emergency Drugs available, Suction available and Patient being monitored Patient Re-evaluated:Patient Re-evaluated prior to induction Oxygen Delivery Method: Circle System Utilized Preoxygenation: Pre-oxygenation with 100% oxygen Induction Type: IV induction and Rapid sequence Laryngoscope Size: Glidescope and 4 (elective glidescope due to compromised pulmonary status and poor airway assessment by patient) Grade View: Grade I Tube type: Oral Tube size: 7.5 mm Number of attempts: 1 Airway Equipment and Method: Stylet,  Oral airway and Video-laryngoscopy Placement Confirmation: ETT inserted through vocal cords under direct vision,  positive ETCO2 and breath sounds checked- equal and bilateral Secured at: 23 cm Tube secured with: Tape Dental Injury: Teeth and Oropharynx as per pre-operative assessment

## 2018-11-14 NOTE — Progress Notes (Addendum)
Much encouragement given to patient to cough and deep breath and use IS or flutter valve. Encouraged pt to allow staff to reposition on side, patient refused several times. Pt also educated on order to ambulate in hallway and pt states that he would "crumble to the floor". RN confirms with pt that his pain is at right ribe and right arm but continues to refused to walk. Pt also refuses most turning and positioning in bed. Pt reports being dissatisfied with pain control however noted to rest quietly at intervals after medication.

## 2018-11-15 ENCOUNTER — Inpatient Hospital Stay (HOSPITAL_COMMUNITY): Payer: Medicare Other

## 2018-11-15 ENCOUNTER — Encounter (HOSPITAL_COMMUNITY): Payer: Self-pay | Admitting: Student

## 2018-11-15 LAB — CBC
HCT: 36.5 % — ABNORMAL LOW (ref 39.0–52.0)
Hemoglobin: 12.2 g/dL — ABNORMAL LOW (ref 13.0–17.0)
MCH: 30.3 pg (ref 26.0–34.0)
MCHC: 33.4 g/dL (ref 30.0–36.0)
MCV: 90.8 fL (ref 80.0–100.0)
Platelets: 226 10*3/uL (ref 150–400)
RBC: 4.02 MIL/uL — ABNORMAL LOW (ref 4.22–5.81)
RDW: 12.1 % (ref 11.5–15.5)
WBC: 23.9 10*3/uL — ABNORMAL HIGH (ref 4.0–10.5)
nRBC: 0 % (ref 0.0–0.2)

## 2018-11-15 LAB — GLUCOSE, CAPILLARY
Glucose-Capillary: 137 mg/dL — ABNORMAL HIGH (ref 70–99)
Glucose-Capillary: 156 mg/dL — ABNORMAL HIGH (ref 70–99)
Glucose-Capillary: 203 mg/dL — ABNORMAL HIGH (ref 70–99)
Glucose-Capillary: 260 mg/dL — ABNORMAL HIGH (ref 70–99)
Glucose-Capillary: 98 mg/dL (ref 70–99)

## 2018-11-15 MED ORDER — NICOTINE POLACRILEX 2 MG MT GUM
2.0000 mg | CHEWING_GUM | OROMUCOSAL | Status: DC | PRN
  Administered 2018-11-16: 2 mg via ORAL
  Filled 2018-11-15 (×2): qty 1

## 2018-11-15 MED ORDER — HYDROMORPHONE HCL 1 MG/ML IJ SOLN
1.0000 mg | Freq: Once | INTRAMUSCULAR | Status: AC
  Administered 2018-11-15: 04:00:00 1 mg via INTRAVENOUS
  Filled 2018-11-15: qty 1

## 2018-11-15 MED ORDER — LORAZEPAM 2 MG/ML IJ SOLN
2.0000 mg | INTRAMUSCULAR | Status: DC | PRN
  Administered 2018-11-15: 2 mg via INTRAVENOUS
  Filled 2018-11-15: qty 1

## 2018-11-15 MED ORDER — IPRATROPIUM-ALBUTEROL 0.5-2.5 (3) MG/3ML IN SOLN
3.0000 mL | Freq: Four times a day (QID) | RESPIRATORY_TRACT | Status: DC
  Administered 2018-11-15 – 2018-11-16 (×6): 3 mL via RESPIRATORY_TRACT
  Filled 2018-11-15 (×6): qty 3

## 2018-11-15 NOTE — Progress Notes (Signed)
Patient refusing turns and bath. States that he wants to get cleaned up in the AM. Pt educated on importance of turning to prevent skin breakdown, and to improve pulmonary hygiene. Patient states he understands the risks, but is refusing turns and bathing at this time.
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
## 2018-11-15 NOTE — Progress Notes (Signed)
Pt refusing to cough or use IS despite encouragement from this RN. This RN educated patient on importance of coughing and using IS to prevent atelectasis and pneumonia. Patient sounds congested. Pt remaining on 5L [place].
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
## 2018-11-15 NOTE — Progress Notes (Signed)
PT Cancellation Note  Patient Details Name: Charles Manning MRN: [id number] DOB: 04/15/1956   Cancelled Treatment:    Reason Eval/Treat Not Completed: Patient not medically ready Holding PT evaluation this am as pt currently going on BiPAP, uncontrolled pain and not able to tolerate mobility. Will follow.   Marguarite Arbour A Berdie Malter 11/15/2018, 8:53 AM Wray Kearns, PT, DPT Acute Rehabilitation Services Pager [phone] Office [phone]    2
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
## 2018-11-15 NOTE — Progress Notes (Signed)
Pt removed the BiPAP mask and does not want to be put back on at this time.  Pt now on 6L N/C.

## 2018-11-15 NOTE — Care Management Important Message (Signed)
Important Message  Patient Details  Name: Charles Manning MRN: [id number] Date of Birth: 02-13-1956   Medicare Important Message Given:  Yes    Memory Argue 11/15/2018, 3:05 PM

## 2018-11-15 NOTE — Progress Notes (Signed)
Inpatient Rehabilitation Admissions Coordinator  Inpatient Rehab Consult received. I will follow up with patient tomorrow for rehab assessment.  Danne Baxter, RN, MSN Rehab Admissions Coordinator [phone] 11/15/2018 5:18 PM

## 2018-11-15 NOTE — Progress Notes (Signed)
Patient verbally aggressive and screaming in pain at this RN. Pt screaming "this pain will kill me". Trauma MD paged. Waiting for response.

## 2018-11-15 NOTE — Progress Notes (Signed)
RT placed patient on BIPAP. RN is aware.  Patient is tolerating well at this time. RT will monitor as needed.

## 2018-11-16 ENCOUNTER — Inpatient Hospital Stay (HOSPITAL_COMMUNITY): Payer: Medicare Other

## 2018-11-16 LAB — BASIC METABOLIC PANEL
Anion gap: 6 (ref 5–15)
BUN: 21 mg/dL (ref 8–23)
CO2: 23 mmol/L (ref 22–32)
Calcium: 7.9 mg/dL — ABNORMAL LOW (ref 8.9–10.3)
Chloride: 110 mmol/L (ref 98–111)
Creatinine, Ser: 0.85 mg/dL (ref 0.61–1.24)
GFR calc Af Amer: >60 mL/min (ref >60)
GFR calc non Af Amer: >60 mL/min (ref >60)
Glucose, Bld: 87 mg/dL (ref 70–99)
Potassium: 4 mmol/L (ref 3.5–5.1)
Sodium: 139 mmol/L (ref 135–145)

## 2018-11-16 LAB — CBC
HCT: 32.2 % — ABNORMAL LOW (ref 39.0–52.0)
Hemoglobin: 10.7 g/dL — ABNORMAL LOW (ref 13.0–17.0)
MCH: 30.5 pg (ref 26.0–34.0)
MCHC: 33.2 g/dL (ref 30.0–36.0)
MCV: 91.7 fL (ref 80.0–100.0)
Platelets: 202 10*3/uL (ref 150–400)
RBC: 3.51 MIL/uL — ABNORMAL LOW (ref 4.22–5.81)
RDW: 12.4 % (ref 11.5–15.5)
WBC: 21.7 10*3/uL — ABNORMAL HIGH (ref 4.0–10.5)
nRBC: 0 % (ref 0.0–0.2)

## 2018-11-16 LAB — GLUCOSE, CAPILLARY
Glucose-Capillary: 128 mg/dL — ABNORMAL HIGH (ref 70–99)
Glucose-Capillary: 123 mg/dL — ABNORMAL HIGH (ref 70–99)
Glucose-Capillary: 106 mg/dL — ABNORMAL HIGH (ref 70–99)
Glucose-Capillary: 135 mg/dL — ABNORMAL HIGH (ref 70–99)

## 2018-11-16 MED ORDER — HYDROMORPHONE HCL 1 MG/ML IJ SOLN
1.0000 mg | INTRAMUSCULAR | Status: DC | PRN
  Administered 2018-11-16 – 2018-11-18 (×10): 1 mg via INTRAVENOUS
  Filled 2018-11-16 (×10): qty 1

## 2018-11-16 MED ORDER — CALCIUM CARBONATE ANTACID 500 MG PO CHEW
400.0000 mg | CHEWABLE_TABLET | Freq: Three times a day (TID) | ORAL | Status: AC
  Administered 2018-11-16: 09:00:00 400 mg via ORAL
  Filled 2018-11-16 (×2): qty 2

## 2018-11-16 MED ORDER — IPRATROPIUM-ALBUTEROL 0.5-2.5 (3) MG/3ML IN SOLN
3.0000 mL | Freq: Three times a day (TID) | RESPIRATORY_TRACT | Status: DC
  Administered 2018-11-16 – 2018-11-17 (×2): 3 mL via RESPIRATORY_TRACT
  Filled 2018-11-16 (×2): qty 3
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
## 2018-11-16 NOTE — Progress Notes (Signed)
Inpatient Rehabilitation Admissions Coordinator  Inpatient rehab consult received. I met very briefly with patient at bedside with his RN. Pt automatically states he will not go or need rehab for his brother will be there with him and can help him all he needs. No further rehab assessment made. RN CM , Almyra Free made aware. We will sign off at this time.  Danne Baxter, RN, MSN Rehab Admissions Coordinator [phone] 11/16/2018 3:19 PM

## 2018-11-16 NOTE — Progress Notes (Signed)
Patient resting comfortably on 6L [place]. No respiratory distress noted. BIPAP is not needed at this time. RT encouraged patient to continue to use flutter to help mobilize secretions. Breath sounds are clear/diminished at this time. RT will monitor patient as needed.
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
## 2018-11-17 ENCOUNTER — Inpatient Hospital Stay (HOSPITAL_COMMUNITY): Payer: Medicare Other

## 2018-11-17 DIAGNOSIS — S42101A Fracture of unspecified part of scapula, right shoulder, initial encounter for closed fracture: Secondary | ICD-10-CM

## 2018-11-17 DIAGNOSIS — J9600 Acute respiratory failure, unspecified whether with hypoxia or hypercapnia: Secondary | ICD-10-CM

## 2018-11-17 DIAGNOSIS — J939 Pneumothorax, unspecified: Secondary | ICD-10-CM

## 2018-11-17 DIAGNOSIS — S27329A Contusion of lung, unspecified, initial encounter: Secondary | ICD-10-CM

## 2018-11-17 DIAGNOSIS — S42001A Fracture of unspecified part of right clavicle, initial encounter for closed fracture: Secondary | ICD-10-CM

## 2018-11-17 LAB — BASIC METABOLIC PANEL
Anion gap: 10 (ref 5–15)
BUN: 16 mg/dL (ref 8–23)
CO2: 24 mmol/L (ref 22–32)
Calcium: 8.4 mg/dL — ABNORMAL LOW (ref 8.9–10.3)
Chloride: 106 mmol/L (ref 98–111)
Creatinine, Ser: 0.88 mg/dL (ref 0.61–1.24)
GFR calc Af Amer: >60 mL/min (ref >60)
GFR calc non Af Amer: >60 mL/min (ref >60)
Glucose, Bld: 53 mg/dL — ABNORMAL LOW (ref 70–99)
Potassium: 3.5 mmol/L (ref 3.5–5.1)
Sodium: 140 mmol/L (ref 135–145)

## 2018-11-17 LAB — GLUCOSE, CAPILLARY
Glucose-Capillary: 45 mg/dL — ABNORMAL LOW (ref 70–99)
Glucose-Capillary: 56 mg/dL — ABNORMAL LOW (ref 70–99)
Glucose-Capillary: 121 mg/dL — ABNORMAL HIGH (ref 70–99)
Glucose-Capillary: 115 mg/dL — ABNORMAL HIGH (ref 70–99)

## 2018-11-17 LAB — CBC
HCT: 34 % — ABNORMAL LOW (ref 39.0–52.0)
Hemoglobin: 11.5 g/dL — ABNORMAL LOW (ref 13.0–17.0)
MCH: 30.7 pg (ref 26.0–34.0)
MCHC: 33.8 g/dL (ref 30.0–36.0)
MCV: 90.9 fL (ref 80.0–100.0)
Platelets: 273 10*3/uL (ref 150–400)
RBC: 3.74 MIL/uL — ABNORMAL LOW (ref 4.22–5.81)
RDW: 12.3 % (ref 11.5–15.5)
WBC: 17.9 10*3/uL — ABNORMAL HIGH (ref 4.0–10.5)
nRBC: 0 % (ref 0.0–0.2)

## 2018-11-17 MED ORDER — INSULIN GLARGINE 100 UNIT/ML ~~LOC~~ SOLN
40.0000 [IU] | Freq: Every day | SUBCUTANEOUS | Status: DC
  Administered 2018-11-18 – 2018-11-22 (×5): 40 [IU] via SUBCUTANEOUS
  Filled 2018-11-17 (×5): qty 0.4

## 2018-11-17 MED ORDER — METOPROLOL TARTRATE 25 MG PO TABS
25.0000 mg | ORAL_TABLET | Freq: Two times a day (BID) | ORAL | Status: DC
  Administered 2018-11-17 – 2018-11-22 (×9): 25 mg via ORAL
  Filled 2018-11-17 (×12): qty 1

## 2018-11-17 MED ORDER — IPRATROPIUM-ALBUTEROL 0.5-2.5 (3) MG/3ML IN SOLN
3.0000 mL | Freq: Two times a day (BID) | RESPIRATORY_TRACT | Status: DC
  Administered 2018-11-17 – 2018-11-22 (×10): 3 mL via RESPIRATORY_TRACT
  Filled 2018-11-17 (×10): qty 3

## 2018-11-17 MED ORDER — METHOCARBAMOL 500 MG PO TABS
1000.0000 mg | ORAL_TABLET | Freq: Three times a day (TID) | ORAL | Status: DC
  Administered 2018-11-17 (×3): 1000 mg via ORAL
  Filled 2018-11-17 (×3): qty 2

## 2018-11-17 MED ORDER — OXYCODONE HCL 5 MG PO TABS
10.0000 mg | ORAL_TABLET | ORAL | Status: DC | PRN
  Administered 2018-11-17 – 2018-11-19 (×4): 15 mg via ORAL
  Administered 2018-11-19: 02:00:00 10 mg via ORAL
  Administered 2018-11-19 – 2018-11-20 (×3): 15 mg via ORAL
  Administered 2018-11-20: 10 mg via ORAL
  Administered 2018-11-20 – 2018-11-21 (×4): 15 mg via ORAL
  Filled 2018-11-17 (×11): qty 3
  Filled 2018-11-17: qty 2
  Filled 2018-11-17 (×2): qty 3

## 2018-11-17 MED ORDER — ALBUTEROL SULFATE (2.5 MG/3ML) 0.083% IN NEBU: 2.5000 mg | INHALATION_SOLUTION | Freq: Four times a day (QID) | RESPIRATORY_TRACT | Status: DC | PRN
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
## 2018-11-17 NOTE — Progress Notes (Signed)
Inpatient Diabetes Program Recommendations  AACE/ADA: New Consensus Statement on Inpatient Glycemic Control (2015)  Target Ranges:  Prepandial:   less than 140 mg/dL      Peak postprandial:   less than 180 mg/dL (1-2 hours)      Critically ill patients:  140 - 180 mg/dL   Lab Results  Component Value Date   GLUCAP 121 (H) 11/17/2018   HGBA1C 8.3 (H) 11/13/2018    Review of Glycemic Control  Results for Charles Manning, Charles Manning (MRN [id number]) as of 11/17/2018 14:10  Ref. Range 11/17/2018 07:59 11/17/2018 08:12 11/17/2018 11:57  Glucose-Capillary Latest Ref Range: 70 - 99 mg/dL 45 (L) 56 (L) 121 (H)   Diabetes history: DM 2 Outpatient Diabetes medications: Metformin 1000 mg daily, Toujeo 100 units daily Current orders for Inpatient glycemic control:  Novolog resistant tid with meals, Lantus 50 units daily Inpatient Diabetes Program Recommendations:    Note low fasting blood sugar this morning.  Consider reducing Lantus to 40 units daily.   Thanks  Adah Perl, RN, BC-ADM Inpatient Diabetes Coordinator Pager [phone] (8a-5p)
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
## 2018-11-17 NOTE — Progress Notes (Signed)
Patient took himself off BIPAP.Patient stated," I can't sleep with that thing on."Oxyen placed at 5 liters nasal canula will continue to monitor.
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
## 2018-11-17 NOTE — Progress Notes (Signed)
RT placed patient on BIPAP. Patient tolerating well at this time. RT will monitor as needed. 

## 2018-11-17 NOTE — Progress Notes (Signed)
Patient blood sugar 45 this morning. Gave 120 cc OJ. Will cont to monitor.
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
## 2018-11-18 ENCOUNTER — Inpatient Hospital Stay (HOSPITAL_COMMUNITY): Payer: Medicare Other

## 2018-11-18 LAB — BASIC METABOLIC PANEL
Anion gap: 7 (ref 5–15)
BUN: 17 mg/dL (ref 8–23)
CO2: 28 mmol/L (ref 22–32)
Calcium: 8.4 mg/dL — ABNORMAL LOW (ref 8.9–10.3)
Chloride: 104 mmol/L (ref 98–111)
Creatinine, Ser: 0.9 mg/dL (ref 0.61–1.24)
GFR calc Af Amer: >60 mL/min (ref >60)
GFR calc non Af Amer: >60 mL/min (ref >60)
Glucose, Bld: 122 mg/dL — ABNORMAL HIGH (ref 70–99)
Potassium: 3.4 mmol/L — ABNORMAL LOW (ref 3.5–5.1)
Sodium: 139 mmol/L (ref 135–145)

## 2018-11-18 LAB — CBC
HCT: 33.3 % — ABNORMAL LOW (ref 39.0–52.0)
Hemoglobin: 11.2 g/dL — ABNORMAL LOW (ref 13.0–17.0)
MCH: 30.3 pg (ref 26.0–34.0)
MCHC: 33.6 g/dL (ref 30.0–36.0)
MCV: 90 fL (ref 80.0–100.0)
Platelets: 291 10*3/uL (ref 150–400)
RBC: 3.7 MIL/uL — ABNORMAL LOW (ref 4.22–5.81)
RDW: 12.3 % (ref 11.5–15.5)
WBC: 15.4 10*3/uL — ABNORMAL HIGH (ref 4.0–10.5)
nRBC: 0 % (ref 0.0–0.2)

## 2018-11-18 LAB — GLUCOSE, CAPILLARY
Glucose-Capillary: 107 mg/dL — ABNORMAL HIGH (ref 70–99)
Glucose-Capillary: 193 mg/dL — ABNORMAL HIGH (ref 70–99)
Glucose-Capillary: 77 mg/dL (ref 70–99)

## 2018-11-18 MED ORDER — POTASSIUM CHLORIDE CRYS ER 20 MEQ PO TBCR
30.0000 meq | EXTENDED_RELEASE_TABLET | Freq: Two times a day (BID) | ORAL | Status: DC
  Administered 2018-11-18 (×2): 30 meq via ORAL
  Filled 2018-11-18 (×2): qty 1

## 2018-11-18 MED ORDER — METHOCARBAMOL 500 MG PO TABS
1000.0000 mg | ORAL_TABLET | Freq: Four times a day (QID) | ORAL | Status: DC
  Administered 2018-11-18 – 2018-11-22 (×18): 1000 mg via ORAL
  Filled 2018-11-18 (×18): qty 2

## 2018-11-18 MED ORDER — BISACODYL 10 MG RE SUPP
10.0000 mg | Freq: Once | RECTAL | Status: DC
  Filled 2018-11-18: qty 1

## 2018-11-18 MED ORDER — HYDROMORPHONE HCL 1 MG/ML IJ SOLN
0.5000 mg | INTRAMUSCULAR | Status: DC | PRN
  Administered 2018-11-18 – 2018-11-19 (×3): 0.5 mg via INTRAVENOUS
  Filled 2018-11-18 (×3): qty 1

## 2018-11-18 NOTE — Progress Notes (Signed)
Inpatient Rehabilitation Admissions Coordinator  Inpatient rehab re consult received. I will follow up with patient on Monday. Noted he is reconsidering his stance on rehab .  Danne Baxter, RN, MSN Rehab Admissions Coordinator [phone] 11/18/2018 8:07 PM
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
## 2018-11-18 NOTE — Progress Notes (Signed)
Notified by telemetry patient had 2 minute run of afib with rvr with rate into 160s. Patient converted back to sr/sb. On call paged.

## 2018-11-18 NOTE — Care Management Important Message (Signed)
Important Message  Patient Details  Name: Charles Manning MRN: [id number] Date of Birth: [birth year]   Medicare Important Message Given:  Yes    Memory Argue 11/18/2018, 1:32 PM

## 2018-11-19 ENCOUNTER — Inpatient Hospital Stay (HOSPITAL_COMMUNITY): Payer: Medicare Other

## 2018-11-19 LAB — BASIC METABOLIC PANEL
Anion gap: 9 (ref 5–15)
BUN: 15 mg/dL (ref 8–23)
CO2: 27 mmol/L (ref 22–32)
Calcium: 8.3 mg/dL — ABNORMAL LOW (ref 8.9–10.3)
Chloride: 104 mmol/L (ref 98–111)
Creatinine, Ser: 0.84 mg/dL (ref 0.61–1.24)
GFR calc Af Amer: >60 mL/min (ref >60)
GFR calc non Af Amer: >60 mL/min (ref >60)
Glucose, Bld: 210 mg/dL — ABNORMAL HIGH (ref 70–99)
Potassium: 3.5 mmol/L (ref 3.5–5.1)
Sodium: 140 mmol/L (ref 135–145)

## 2018-11-19 LAB — GLUCOSE, CAPILLARY
Glucose-Capillary: 133 mg/dL — ABNORMAL HIGH (ref 70–99)
Glucose-Capillary: 155 mg/dL — ABNORMAL HIGH (ref 70–99)
Glucose-Capillary: 156 mg/dL — ABNORMAL HIGH (ref 70–99)
Glucose-Capillary: 168 mg/dL — ABNORMAL HIGH (ref 70–99)
Glucose-Capillary: 81 mg/dL (ref 70–99)

## 2018-11-19 MED ORDER — GABAPENTIN 800 MG PO TABS
400.0000 mg | ORAL_TABLET | Freq: Three times a day (TID) | ORAL | Status: DC
  Filled 2018-11-19: qty 0.5

## 2018-11-19 MED ORDER — GABAPENTIN 400 MG PO CAPS
800.0000 mg | ORAL_CAPSULE | Freq: Three times a day (TID) | ORAL | Status: DC
  Administered 2018-11-19 (×3): 800 mg via ORAL
  Filled 2018-11-19 (×3): qty 2

## 2018-11-19 MED ORDER — HYDROMORPHONE HCL 1 MG/ML IJ SOLN
0.5000 mg | Freq: Four times a day (QID) | INTRAMUSCULAR | Status: DC | PRN
  Administered 2018-11-19 – 2018-11-20 (×4): 0.5 mg via INTRAVENOUS
  Filled 2018-11-19 (×4): qty 1

## 2018-11-19 MED ORDER — POTASSIUM CHLORIDE CRYS ER 20 MEQ PO TBCR
30.0000 meq | EXTENDED_RELEASE_TABLET | Freq: Two times a day (BID) | ORAL | Status: AC
  Administered 2018-11-19 – 2018-11-20 (×3): 30 meq via ORAL
  Filled 2018-11-19 (×3): qty 1

## 2018-11-19 MED ORDER — ZOLPIDEM TARTRATE 5 MG PO TABS
5.0000 mg | ORAL_TABLET | Freq: Every evening | ORAL | Status: DC | PRN
  Filled 2018-11-19: qty 1

## 2018-11-19 NOTE — Progress Notes (Signed)
[facility] Progress Note  5 Days Post-Op  Subjective: CC: pain Patient still complaining of pain, did not maximize oxycodone yesterday. He persistently displays pain seeking behavior and gets angry and argumentative when I do not agree to increase his dilaudid dosage or give him oral dilaudid. He did have a BM yesterday but feels like he will need to have another today. He is still more interested in CIR.   Objective: Vital signs in last 24 hours: Temp:  [98.2 F (36.8 C)-99.4 F (37.4 C)] 98.2 F (36.8 C) (06/13 0424) Pulse Rate:  [49-62] 50 (06/13 0424) Resp:  [16-19] 18 (06/13 0424) BP: (126-171)/(71-93) 145/93 (06/13 0424) SpO2:  [93 %-98 %] 98 % (06/13 0424) Last BM Date: 11/12/18  Intake/Output from previous day: 06/12 0701 - 06/13 0700 In: 1071 [P.O.:1071] Out: 850 [Urine:700; Chest Tube:150] Intake/Output this shift: No intake/output data recorded.  PE: Gen: Alert, NAD Card: Regular rate and rhythm, pedal pulses 2+ BL Pulm:diminished on the right but breath sounds less junky, O2 sat 93% onroom air, CT on the R without an air leak and SS output Abd: Soft, non-tender, non-distended,+BS Skin:multiple abrasions over extremities Ext: RUE incision c/d/i, good ROM at R elbow and wrist Psych: A&Ox3  Lab Results:  Recent Labs    11/17/18 0405 11/18/18 0703  WBC 17.9* 15.4*  HGB 11.5* 11.2*  HCT 34.0* 33.3*  PLT 273 291   BMET Recent Labs    11/18/18 0703 11/19/18 0450  NA 139 140  K 3.4* 3.5  CL 104 104  CO2 28 27  GLUCOSE 122* 210*  BUN 17 15  CREATININE 0.90 0.84  CALCIUM 8.4* 8.3*   PT/INR No results for input(s): LABPROT, INR in the last 72 hours. CMP     Component Value Date/Time   NA 140 11/19/2018 0450   K 3.5 11/19/2018 0450   CL 104 11/19/2018 0450   CO2 27 11/19/2018 0450   GLUCOSE 210 (H) 11/19/2018 0450   BUN 15 11/19/2018 0450   CREATININE 0.84 11/19/2018 0450   CALCIUM 8.3 (L) 11/19/2018 0450   PROT 5.8 (L)  11/13/2018 0834   ALBUMIN 3.1 (L) 11/13/2018 0834   AST 41 11/13/2018 0834   ALT 23 11/13/2018 0834   ALKPHOS 42 11/13/2018 0834   BILITOT 0.8 11/13/2018 0834   GFRNONAA >60 11/19/2018 0450   GFRAA >60 11/19/2018 0450   Lipase  No results found for: LIPASE     Studies/Results: Dg Chest Port 1 View  Result Date: 11/18/2018 CLINICAL DATA:  Right hemithorax. EXAM: PORTABLE CHEST 1 VIEW COMPARISON:  11/17/2018 FINDINGS: Right chest pigtail drain is in a stable position. Catheter tip is along the medial right upper chest. Slightly improved aeration in the right lung without a large pneumothorax. Again noted is subcutaneous gas in the right upper chest and lower neck region. Multiple right rib fractures. Right clavicle fracture with plate and screw fixation. Left lung remains clear. Trachea is midline. Heart size is stable and within normal limits. IMPRESSION: 1. Stable position of the right chest tube. Negative for a pneumothorax. 2. Slightly improved aeration in the right lung. Electronically Signed   By: Markus Daft M.D.   On: 11/18/2018 08:29    Anti-infectives: Anti-infectives (From admission, onward)   Start     Dose/Rate Route Frequency Ordered Stop   11/14/18 2130  ceFAZolin (ANCEF) IVPB 2g/100 mL premix     2 g 200 mL/hr over 30 Minutes Intravenous Every 8 hours 11/14/18 1819 11/15/18 1530  11/14/18 1430  tobramycin (NEBCIN) powder  Status:  Discontinued       As needed 11/14/18 1430 11/14/18 1509       Assessment/Plan Diabetes mellitus- SSI Hypertension-home Lisinopril, add BID lopressor 25 mg H/o hep C - completed treatment course  [facility] Pneumomediastinum Right hemopneumothorax-chest tube towater seal, output too high to pull  Acute hypoxic respiratory failure- CXR pending today,continuescheduled BDs, pulmonary toilet RML/RULpulmonary contusion Right rib fractures 1 through 7- pulm toilet, pain control, IS, flutter valve Comminuted right clavicle  fracture&Right scapular fracture -ORIF 6/8 by Dr. Doreatha Martin. NWB. Multiple abrasions- bacitracin  EUM:PNTI mod VTE: SCD's, lovenox RW:ERXVQ periop Foley:none Follow up:TBD  DISPO:Continue CT on WS. Continue to work on pain control, increased gabapentin dose and encouraged patient to take oxycodone more often. Eventual CIR.   LOS: 7 days    Brigid Re , [facility] 11/19/2018, 7:42 AM Pager: [phone]

## 2018-11-20 ENCOUNTER — Inpatient Hospital Stay (HOSPITAL_COMMUNITY): Payer: Medicare Other

## 2018-11-20 ENCOUNTER — Encounter (HOSPITAL_COMMUNITY): Payer: Self-pay | Admitting: General Practice

## 2018-11-20 LAB — GLUCOSE, CAPILLARY
Glucose-Capillary: 162 mg/dL — ABNORMAL HIGH (ref 70–99)
Glucose-Capillary: 122 mg/dL — ABNORMAL HIGH (ref 70–99)
Glucose-Capillary: 128 mg/dL — ABNORMAL HIGH (ref 70–99)
Glucose-Capillary: 183 mg/dL — ABNORMAL HIGH (ref 70–99)

## 2018-11-20 LAB — BASIC METABOLIC PANEL
Anion gap: 10 (ref 5–15)
BUN: 15 mg/dL (ref 8–23)
CO2: 24 mmol/L (ref 22–32)
Calcium: 8.5 mg/dL — ABNORMAL LOW (ref 8.9–10.3)
Chloride: 103 mmol/L (ref 98–111)
Creatinine, Ser: 0.79 mg/dL (ref 0.61–1.24)
GFR calc Af Amer: >60 mL/min (ref >60)
GFR calc non Af Amer: >60 mL/min (ref >60)
Glucose, Bld: 171 mg/dL — ABNORMAL HIGH (ref 70–99)
Potassium: 3.9 mmol/L (ref 3.5–5.1)
Sodium: 137 mmol/L (ref 135–145)

## 2018-11-20 MED ORDER — GABAPENTIN 400 MG PO CAPS
400.0000 mg | ORAL_CAPSULE | Freq: Three times a day (TID) | ORAL | Status: DC
  Administered 2018-11-20 – 2018-11-22 (×7): 400 mg via ORAL
  Filled 2018-11-20 (×7): qty 1

## 2018-11-20 MED ORDER — TRAMADOL HCL 50 MG PO TABS
50.0000 mg | ORAL_TABLET | Freq: Four times a day (QID) | ORAL | Status: DC | PRN
  Administered 2018-11-20 – 2018-11-21 (×2): 100 mg via ORAL
  Filled 2018-11-20 (×2): qty 2

## 2018-11-20 MED ORDER — LIDOCAINE 5 % EX PTCH
1.0000 | MEDICATED_PATCH | CUTANEOUS | Status: DC
  Administered 2018-11-20 – 2018-11-21 (×2): 1 via TRANSDERMAL
  Filled 2018-11-20 (×4): qty 1

## 2018-11-20 MED ORDER — HYDROMORPHONE HCL 1 MG/ML IJ SOLN
0.5000 mg | Freq: Four times a day (QID) | INTRAMUSCULAR | Status: DC | PRN
  Administered 2018-11-20 – 2018-11-21 (×4): 0.5 mg via INTRAVENOUS
  Filled 2018-11-20 (×4): qty 1

## 2018-11-20 NOTE — Progress Notes (Addendum)
[facility] Progress Note  6 Days Post-Op  Subjective: CC: pain  Patient Still requesting dilaudid. Tolerating diet, did have another BM yesterday. RN helped him get cleaned up yesterday and he was appreciative.   Objective: Vital signs in last 24 hours: Temp:  [97.7 F (36.5 C)-98.9 F (37.2 C)] 98 F (36.7 C) (06/14 0435) Pulse Rate:  [47-65] 48 (06/14 0625) Resp:  [11-21] 14 (06/14 0625) BP: (138-204)/(74-104) 181/90 (06/14 0445) SpO2:  [90 %-96 %] 95 % (06/14 0625) FiO2 (%):  [28 %] 28 % (06/13 0850) Last BM Date: 11/19/18  Intake/Output from previous day: 06/13 0701 - 06/14 0700 In: 1060 [P.O.:1060] Out: 940 [Urine:750; Chest Tube:190] Intake/Output this shift: No intake/output data recorded.  PE: Gen: Alert, NAD Card: sinus bradycardia, pedal pulses 2+ BL Pulm:CTAB, O2 sat 93% onroom air, CT on the R without an air leak and SS output Abd: Soft, non-tender, non-distended,+BS Skin:multiple abrasions over extremities Ext: RUE incision c/d/i, good ROM at R elbow and wrist Psych: A&Ox3   Lab Results:  Recent Labs    11/18/18 0703  WBC 15.4*  HGB 11.2*  HCT 33.3*  PLT 291   BMET Recent Labs    11/18/18 0703 11/19/18 0450  NA 139 140  K 3.4* 3.5  CL 104 104  CO2 28 27  GLUCOSE 122* 210*  BUN 17 15  CREATININE 0.90 0.84  CALCIUM 8.4* 8.3*   PT/INR No results for input(s): LABPROT, INR in the last 72 hours. CMP     Component Value Date/Time   NA 140 11/19/2018 0450   K 3.5 11/19/2018 0450   CL 104 11/19/2018 0450   CO2 27 11/19/2018 0450   GLUCOSE 210 (H) 11/19/2018 0450   BUN 15 11/19/2018 0450   CREATININE 0.84 11/19/2018 0450   CALCIUM 8.3 (L) 11/19/2018 0450   PROT 5.8 (L) 11/13/2018 0834   ALBUMIN 3.1 (L) 11/13/2018 0834   AST 41 11/13/2018 0834   ALT 23 11/13/2018 0834   ALKPHOS 42 11/13/2018 0834   BILITOT 0.8 11/13/2018 0834   GFRNONAA >60 11/19/2018 0450   GFRAA >60 11/19/2018 0450   Lipase  No results found  for: LIPASE     Studies/Results: Dg Chest Port 1 View  Result Date: 11/19/2018 CLINICAL DATA:  RIGHT hemothorax, diabetes mellitus, hypertension EXAM: PORTABLE CHEST 1 VIEW COMPARISON:  Portable exam 0802 hours compared to 11/18/2018 FINDINGS: Pigtail RIGHT thoracostomy tube unchanged. Enlargement of cardiac silhouette with pulmonary vascular congestion. Persistent RIGHT pleural effusion and scattered RIGHT lung atelectasis. LEFT lung clear No pneumothorax. Multiple RIGHT-sided rib fractures again identified. IMPRESSION: Persistent RIGHT pleural effusion and atelectasis without pneumothorax. Electronically Signed   By: Lavonia Dana M.D.   On: 11/19/2018 12:55    Anti-infectives: Anti-infectives (From admission, onward)   Start     Dose/Rate Route Frequency Ordered Stop   11/14/18 2130  ceFAZolin (ANCEF) IVPB 2g/100 mL premix     2 g 200 mL/hr over 30 Minutes Intravenous Every 8 hours 11/14/18 1819 11/15/18 1530   11/14/18 1430  tobramycin (NEBCIN) powder  Status:  Discontinued       As needed 11/14/18 1430 11/14/18 1509       Assessment/Plan Diabetes mellitus- SSI Hypertension-home Lisinopril, add BID lopressor 25 mg H/o hep C - completed treatment course  [facility] Pneumomediastinum Right hemopneumothorax-chest tube towater seal, output too high to pull Acute hypoxic respiratory failure-CXR pending today,continuescheduled BDs, pulmonary toilet RML/RULpulmonary contusion Right rib fractures 1 through 7- pulm toilet, pain control,  IS, flutter valve Comminuted right clavicle fracture&Right scapular fracture -ORIF 6/8 by Dr. Doreatha Martin. NWB. Multiple abrasions- bacitracin  LFY:BOFB mod VTE: SCD's, lovenox PZ:WCHEN periop Foley:none Follow up:TBD  DISPO:Continue CT on WS. Patient continuing to request dilaudid, will d/c today. Eventual CIR.   LOS: 8 days    Brigid Re , [facility] 11/20/2018, 7:33 AM Pager: [phone] Consults:  [phone]

## 2018-11-21 ENCOUNTER — Inpatient Hospital Stay (HOSPITAL_COMMUNITY): Payer: Medicare Other

## 2018-11-21 LAB — GLUCOSE, CAPILLARY
Glucose-Capillary: 138 mg/dL — ABNORMAL HIGH (ref 70–99)
Glucose-Capillary: 176 mg/dL — ABNORMAL HIGH (ref 70–99)
Glucose-Capillary: 181 mg/dL — ABNORMAL HIGH (ref 70–99)
Glucose-Capillary: 75 mg/dL (ref 70–99)

## 2018-11-21 MED ORDER — HYDROMORPHONE HCL 1 MG/ML IJ SOLN
0.5000 mg | Freq: Four times a day (QID) | INTRAMUSCULAR | Status: DC | PRN
  Administered 2018-11-21 – 2018-11-22 (×4): 0.5 mg via INTRAVENOUS
  Filled 2018-11-21 (×4): qty 1

## 2018-11-21 MED ORDER — MAGNESIUM CITRATE PO SOLN: 1.0000 | Freq: Once | ORAL | Status: DC | PRN

## 2018-11-21 MED ORDER — OXYCODONE HCL 5 MG PO TABS
10.0000 mg | ORAL_TABLET | ORAL | Status: DC | PRN
  Administered 2018-11-21 – 2018-11-22 (×6): 15 mg via ORAL
  Filled 2018-11-21 (×6): qty 3

## 2018-11-21 NOTE — Progress Notes (Signed)
Inpatient Rehabilitation Admissions Coordinator  Noted pt continues to decline assessment for CIR. Pt min guard assist with therapy today. I recommend HH. Pt not in need of an intense inpt rehab admit at this level. We will once again sign off.  Danne Baxter, RN, MSN Rehab Admissions Coordinator [phone] 11/21/2018 11:28 AM

## 2018-11-21 NOTE — Care Management Important Message (Signed)
Important Message  Patient Details  Name: Charles Manning MRN: [id number] Date of Birth: [birth year]   Medicare Important Message Given:  Yes    Memory Argue 11/21/2018, 3:51 PM

## 2018-11-21 NOTE — Care Management Important Message (Signed)
Important Message  Patient Details  Name: Charles Manning MRN: [id number] Date of Birth: [birth year]   Medicare Important Message Given:  Yes    Memory Argue 11/21/2018, 3:53 PM
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
## 2018-11-21 NOTE — Progress Notes (Signed)
Ortho Trauma Note  Doing okay. Pain is still an issue. Trying to work on some motion of shoulder  Incision clean and dry. Gentle FE to 70 deg passively. Neuro intact  A/P s/p ORIF clavicle  NWB RUE Okay for passive and active ROM, encourage pendulum exericises Follow up 2 weeks in office for x-rays  Shona Needles, MD [facility] [phone] (phone) [phone] (office) [email or web address]

## 2018-11-22 ENCOUNTER — Inpatient Hospital Stay (HOSPITAL_COMMUNITY): Payer: Medicare Other

## 2018-11-22 LAB — GLUCOSE, CAPILLARY
Glucose-Capillary: 142 mg/dL — ABNORMAL HIGH (ref 70–99)
Glucose-Capillary: 194 mg/dL — ABNORMAL HIGH (ref 70–99)

## 2018-11-22 MED ORDER — METOPROLOL TARTRATE 25 MG PO TABS: 25.0000 mg | ORAL_TABLET | Freq: Two times a day (BID) | ORAL | 0 refills | Status: DC

## 2018-11-22 MED ORDER — GUAIFENESIN ER 600 MG PO TB12: 600.0000 mg | ORAL_TABLET | Freq: Two times a day (BID) | ORAL | Status: DC

## 2018-11-22 MED ORDER — BACITRACIN ZINC 500 UNIT/GM EX OINT: TOPICAL_OINTMENT | Freq: Two times a day (BID) | CUTANEOUS | 0 refills | Status: DC

## 2018-11-22 MED ORDER — TRAMADOL HCL 50 MG PO TABS: 50.0000 mg | ORAL_TABLET | Freq: Four times a day (QID) | ORAL | 0 refills | Status: DC | PRN

## 2018-11-22 MED ORDER — METHOCARBAMOL 500 MG PO TABS: 1000.0000 mg | ORAL_TABLET | Freq: Four times a day (QID) | ORAL | 0 refills | Status: DC | PRN

## 2018-11-22 MED ORDER — IBUPROFEN 600 MG PO TABS: 600.0000 mg | ORAL_TABLET | Freq: Three times a day (TID) | ORAL | 0 refills | Status: DC

## 2018-11-22 MED ORDER — OXYCODONE HCL 10 MG PO TABS: 10.0000 mg | ORAL_TABLET | Freq: Four times a day (QID) | ORAL | 0 refills | Status: DC | PRN

## 2018-11-22 MED ORDER — DOCUSATE SODIUM 100 MG PO CAPS: 100.0000 mg | ORAL_CAPSULE | Freq: Two times a day (BID) | ORAL | 0 refills | Status: DC

## 2018-11-22 MED ORDER — POLYETHYLENE GLYCOL 3350 17 G PO PACK: 17.0000 g | PACK | Freq: Every day | ORAL | 0 refills | Status: DC

## 2018-11-22 MED ORDER — ACETAMINOPHEN 500 MG PO TABS: 1000.0000 mg | ORAL_TABLET | Freq: Four times a day (QID) | ORAL | 0 refills | Status: DC | PRN

## 2018-11-22 NOTE — Progress Notes (Signed)
Discharge instructions reviewed with pt, as much as pt would allow, he declined to have me review all meds listed, and did not want instructions read to him, stated he had most of the meds at home, and he could read instructions at home. Pt was only interested in getting out the door to go home. Pt informed of upcoming appointments as noted on instructions.  Copy of instructions given to pt.  Made him aware of scripts at his pharmacy.  Pt d/c'd via wheelchair with belongings, with his brother picking him up at the main entrance.            Escorted by unit NT.

## 2018-11-22 NOTE — Progress Notes (Signed)
Pt does not have a legal guardian  °
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
## 2018-11-22 NOTE — Progress Notes (Signed)
PT Cancellation Note  Patient Details Name: Charles Manning MRN: [id number] DOB: July 09, 1955   Cancelled Treatment:    Reason Eval/Treat Not Completed: Medical issues which prohibited therapy Pt getting chest tube removed and then awaiting chest CT. Will follow.   Marguarite Arbour A Kennedey Digilio 11/22/2018, 10:22 AM Wray Kearns, PT, DPT Acute Rehabilitation Services Pager [phone] Office [phone]
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
## 2018-11-22 NOTE — Progress Notes (Signed)
[facility] Progress Note  8 Days Post-Op  Subjective: CC-  Continues to have pain in RUE and rib fractures. Denies SOB. Pulling 2000 on IS. Tolerating diet. Denies n/v. Last BM 3 days ago. Declined inpatient rehab, plans to go home with his brother.  Objective: Vital signs in last 24 hours: Temp:  [98.1 F (36.7 C)-98.7 F (37.1 C)] 98.2 F (36.8 C) (06/16 0815) Pulse Rate:  [47-60] 60 (06/16 0815) Resp:  [11-23] 20 (06/16 0815) BP: (115-174)/(80-99) 174/83 (06/16 0815) SpO2:  [93 %-95 %] 94 % (06/16 0825) Last BM Date: 11/18/18(per patient)  Intake/Output from previous day: 06/15 0701 - 06/16 0700 In: 960 [P.O.:960] Out: 2300 [Urine:2250; Chest Tube:50] Intake/Output this shift: No intake/output data recorded.  PE: Gen: Alert, NAD Card: RRR, 2+ DP pulses bilaterally Pulm:CTAB, no wheezing, CT on the R without an air leak and SS output Abd: Soft, non-tender, non-distended,+BS Skin:multiple abrasions over extremities Ext: RUEincisionc/d/i, good ROM at R elbow and wrist, 2+ radial pulse Psych: A&Ox3    Lab Results:  No results for input(s): WBC, HGB, HCT, PLT in the last 72 hours. BMET Recent Labs    11/20/18 0608  NA 137  K 3.9  CL 103  CO2 24  GLUCOSE 171*  BUN 15  CREATININE 0.79  CALCIUM 8.5*   PT/INR No results for input(s): LABPROT, INR in the last 72 hours. CMP     Component Value Date/Time   NA 137 11/20/2018 0608   K 3.9 11/20/2018 0608   CL 103 11/20/2018 0608   CO2 24 11/20/2018 0608   GLUCOSE 171 (H) 11/20/2018 0608   BUN 15 11/20/2018 0608   CREATININE 0.79 11/20/2018 0608   CALCIUM 8.5 (L) 11/20/2018 0608   PROT 5.8 (L) 11/13/2018 0834   ALBUMIN 3.1 (L) 11/13/2018 0834   AST 41 11/13/2018 0834   ALT 23 11/13/2018 0834   ALKPHOS 42 11/13/2018 0834   BILITOT 0.8 11/13/2018 0834   GFRNONAA >60 11/20/2018 0608   GFRAA >60 11/20/2018 0608   Lipase  No results found for: LIPASE     Studies/Results: Dg  Chest Port 1 View  Result Date: 11/22/2018 CLINICAL DATA:  Follow up pneumothorax. EXAM: PORTABLE CHEST 1 VIEW COMPARISON:  Radiographs 11/20/2018 and 11/21/2018. FINDINGS: 0520 hours. Small caliber right chest tube is unchanged, projecting over the right upper hemithorax. No definite residual pneumothorax identified. There is a small amount of soft tissue emphysema in the right neck and right chest wall. There is stable underlying volume loss in the right hemithorax with associated pleuroparenchymal opacification. The left lung is clear. The heart size and mediastinal contours are stable. Previous right clavicular ORIF and multiple right-sided rib fractures again noted. IMPRESSION: Stable appearance of the chest. No residual/recurrent pneumothorax identified. Electronically Signed   By: Richardean Sale M.D.   On: 11/22/2018 08:20   Dg Chest Port 1 View  Result Date: 11/21/2018 CLINICAL DATA:  Right hemothorax. EXAM: PORTABLE CHEST 1 VIEW COMPARISON:  11/20/2018 and CT chest 11/12/2018. FINDINGS: Trachea is midline. Heart size stable. A small bore pigtail catheter terminates in the upper right hemithorax. No definite pneumothorax. Pleuroparenchymal opacification in the right hemithorax appears similar to yesterday's exam. Subcutaneous emphysema in the right neck and right chest wall. Plate and screw fixation of the right clavicle and multiple right rib fractures are again noted. IMPRESSION: 1. No pneumothorax with right chest tube in place. 2. Persistent pleuroparenchymal opacification in the right hemithorax, likely representing a combination of pleural fluid and  atelectasis. Electronically Signed   By: Lorin Picket M.D.   On: 11/21/2018 08:03    Anti-infectives: Anti-infectives (From admission, onward)   Start     Dose/Rate Route Frequency Ordered Stop   11/14/18 2130  ceFAZolin (ANCEF) IVPB 2g/100 mL premix     2 g 200 mL/hr over 30 Minutes Intravenous Every 8 hours 11/14/18 1819 11/15/18 1530    11/14/18 1430  tobramycin (NEBCIN) powder  Status:  Discontinued       As needed 11/14/18 1430 11/14/18 1509       Assessment/Plan Diabetes mellitus- SSI Hypertension-home Lisinopril, added BID lopressor 25 mg H/o hep C - completed treatment course  [facility] Pneumomediastinum Right hemopneumothorax-CXR looks good today and CT output low, d/c chest tube today Acute hypoxic respiratory failure-continuescheduled BDs and mucinex, pulmonary toilet RML/RULpulmonary contusion Right rib fractures 1 through 7- pulm toilet, pain control, IS, flutter valve Comminuted right clavicle fracture&Right scapular fracture -ORIF 6/8 by Dr. Doreatha Martin. NWB. Multiple abrasions- bacitracin  ZMO:QHUT mod VTE: SCD's, lovenox ML:YYTKP periop Foley:none Follow TW:SFKCLE, Dr. Doreatha Martin, PCP  DISPO:Home health PT/OT ordered. D/c chest tube and repeat CXR later today. If stable will plan for discharge this afternoon.   LOS: 10 days    Wellington Hampshire , [facility] 11/22/2018, 8:38 AM Pager: [phone] Mon-Thurs 7:00 am-4:30 pm Fri 7:00 am -11:30 AM Sat-Sun 7:00 am-11:30 am

## 2018-11-22 NOTE — Discharge Instructions (Signed)
[facility] Discharge Instructions   General Discharge Instructions  WEIGHT BEARING STATUS: Non-weightbearing right upper extremity  RANGE OF MOTION/ACTIVITY: Okay for range of motion of right shoulder and elbow as much as tolerated  Wound Care: Incisions can be left open to air if there is no drainage. Okay to shower if no drainage from incisions.  DVT/PE prophylaxis: None needed  Diet: as you were eating previously.  Can use over the counter stool softeners and bowel preparations, such as Miralax, to help with bowel movements.  Narcotics can be constipating.  Be sure to drink plenty of fluids  PAIN MEDICATION USE AND EXPECTATIONS  You have likely been given narcotic medications to help control your pain.  After a traumatic event that results in an fracture (broken bone) with or without surgery, it is ok to use narcotic pain medications to help control one's pain.  We understand that everyone responds to pain differently and each individual patient will be evaluated on a regular basis for the continued need for narcotic medications. Ideally, narcotic medication use should last no more than 6-8 weeks (coinciding with fracture healing).   As a patient it is your responsibility as well to monitor narcotic medication use and report the amount and frequency you use these medications when you come to your office visit.   We would also advise that if you are using narcotic medications, you should take a dose prior to therapy to maximize you participation.  IF YOU ARE ON NARCOTIC MEDICATIONS IT IS NOT PERMISSIBLE TO OPERATE A MOTOR VEHICLE (MOTORCYCLE/CAR/TRUCK/MOPED) OR HEAVY MACHINERY DO NOT MIX NARCOTICS WITH OTHER CNS (CENTRAL NERVOUS SYSTEM) DEPRESSANTS SUCH AS ALCOHOL   STOP SMOKING OR USING NICOTINE PRODUCTS!!!!  As discussed nicotine severely impairs your body's ability to heal surgical and traumatic wounds but also impairs bone healing.  Wounds and bone heal by forming  microscopic blood vessels (angiogenesis) and nicotine is a vasoconstrictor (essentially, shrinks blood vessels).  Therefore, if vasoconstriction occurs to these microscopic blood vessels they essentially disappear and are unable to deliver necessary nutrients to the healing tissue.  This is one modifiable factor that you can do to dramatically increase your chances of healing your injury.    (This means no smoking, no nicotine gum, patches, etc)  DO NOT USE NONSTEROIDAL ANTI-INFLAMMATORY DRUGS (NSAID'S)  Using products such as Advil (ibuprofen), Aleve (naproxen), Motrin (ibuprofen) for additional pain control during fracture healing can delay and/or prevent the healing response.  If you would like to take over the counter (OTC) medication, Tylenol (acetaminophen) is ok.  However, some narcotic medications that are given for pain control contain acetaminophen as well. Therefore, you should not exceed more than 4000 mg of tylenol in a day if you do not have liver disease.  Also note that there are may OTC medicines, such as cold medicines and allergy medicines that my contain tylenol as well.  If you have any questions about medications and/or interactions please ask your doctor/PA or your pharmacist.      ICE AND ELEVATE INJURED/OPERATIVE EXTREMITY  Using ice and elevating the injured extremity above your heart can help with swelling and pain control.  Icing in a pulsatile fashion, such as 20 minutes on and 20 minutes off, can be followed.    Do not place ice directly on skin. Make sure there is a barrier between to skin and the ice pack.    Using frozen items such as frozen peas works well as the conform nicely to  the are that needs to be iced.  USE AN ACE WRAP OR TED HOSE FOR SWELLING CONTROL  In addition to icing and elevation, Ace wraps or TED hose are used to help limit and resolve swelling.  It is recommended to use Ace wraps or TED hose until you are informed to stop.    When using Ace Wraps  start the wrapping distally (farthest away from the body) and wrap proximally (closer to the body)   Example: If you had surgery on your leg or thing and you do not have a splint on, start the ace wrap at the toes and work your way up to the thigh        If you had surgery on your upper extremity and do not have a splint on, start the ace wrap at your fingers and work your way up to the upper arm  [place]: [phone]   VISIT OUR WEBSITE FOR ADDITIONAL INFORMATION: [email or web address]      PNEUMOTHORAX OR HEMOTHORAX +/- RIB FRACTURES  HOME INSTRUCTIONS   1. PAIN CONTROL:  1. Pain is best controlled by a usual combination of three different methods TOGETHER:  i. Ice/Heat ii. Over the counter pain medication iii. Prescription pain medication 2. You may experience some swelling and bruising in area of broken ribs. Ice packs or heating pads (30-60 minutes up to 6 times a day) will help. Use ice for the first few days to help decrease swelling and bruising, then switch to heat to help relax tight/sore spots and speed recovery. Some people prefer to use ice alone, heat alone, alternating between ice & heat. Experiment to what works for you. Swelling and bruising can take several weeks to resolve.  3. It is helpful to take an over-the-counter pain medication regularly for the first few weeks. Choose one of the following that works best for you:  i. Naproxen (Aleve, etc) Two 220mg  tabs twice a day ii. Ibuprofen (Advil, etc) Three 200mg  tabs four times a day (every meal & bedtime) iii. Acetaminophen (Tylenol, etc) 500-650mg  four times a day (every meal & bedtime) 4. A prescription for pain medication (such as oxycodone, hydrocodone, etc) may be given to you upon discharge. Take your pain medication as prescribed.  i. If you are having problems/concerns with the prescription medicine (does not control pain, nausea, vomiting, rash, itching, etc), please call us  [phone] to see if we need to switch you to a different pain medicine that will work better for you and/or control your side effect better. ii. If you need a refill on your pain medication, please contact your pharmacy. They will contact our office to request authorization. Prescriptions will not be filled after 5 pm or on week-ends. 1. Avoid getting constipated. When taking pain medications, it is common to experience some constipation. Increasing fluid intake and taking a fiber supplement (such as Metamucil, Citrucel, FiberCon, MiraLax, etc) 1-2 times a day regularly will usually help prevent this problem from occurring. A mild laxative (prune juice, Milk of Magnesia, MiraLax, etc) should be taken according to package directions if there are no bowel movements after 48 hours.  2. Watch out for diarrhea. If you have many loose bowel movements, simplify your diet to bland foods & liquids for a few days. Stop any stool softeners and decrease your fiber supplement. Switching to mild anti-diarrheal medications (Kayopectate, Pepto Bismol) can help. If this worsens or does not improve, please call us. 3. Chest tube  site wound: you may remove the dressing from your chest tube site 3 days after the removal of your chest tube. DO NOT shower over the dressing. Once   removed, you may shower as normal. Do not submerge your wound in water for 2-3 weeks.  4. FOLLOW UP  a. Please call our office to set up or confirm an appointment for follow up for 2 weeks after discharge. You will need to get a chest xray at either [facility] or [facility]. This will be outlined in your follow up instructions. Please call [facility] at [phone] if you have any questions about follow up.  b. If you have any orthopedic or other injuries you will need to follow up as outlined in your follow up instructions.   WHEN TO CALL us [phone]:  1. Poor pain control 2. Reactions / problems with new medications  (rash/itching, nausea, etc)  3. Fever over 101.5 F (38.5 C) 4. Worsening swelling or bruising 5. Redness, drainage, pain or swelling around chest tube site 6. Worsening pain, productive cough, difficulty breathing or any other concerning symptoms  The clinic staff is available to answer your questions during regular business hours (8:30am-5pm). Please dont hesitate to call and ask to speak to one of our nurses for clinical concerns.  If you have a medical emergency, go to the nearest emergency room or call 911.  A surgeon from [facility] is always on call at the [facility], [place], [place], [place], [place] [address] ?  MAIN: [phone] ? TOLL FREE: [phone] ?  FAX [phone]) [phone]  [email or web address]      Information on Rib Fractures  A rib fracture is a break or crack in one of the bones of the ribs. The ribs are long, curved bones that wrap around your chest and attach to your spine and your breastbone. The ribs protect your heart, lungs, and other organs in the chest. A broken or cracked rib is often painful but is not usually serious. Most rib fractures heal on their own over time. However, rib fractures can be more serious if multiple ribs are broken or if broken ribs move out of place and push against other structures or organs. What are the causes? This condition is caused by:  Repetitive movements with high force, such as pitching a baseball or having severe coughing spells.  A direct blow to the chest, such as a sports injury, a car accident, or a fall.  Cancer that has spread to the bones, which can weaken bones and cause them to break. What are the signs or symptoms? Symptoms of this condition include:  Pain when you breathe in or cough.  Pain when someone presses on the injured area.  Feeling short of breath. How is this diagnosed? This condition is diagnosed with a physical exam and  medical history. Imaging tests may also be done, such as:  Chest X-ray.  CT scan.  MRI.  Bone scan.  Chest ultrasound. How is this treated? Treatment for this condition depends on the severity of the fracture. Most rib fractures usually heal on their own in 1-3 months. Sometimes healing takes longer if there is a cough that does not stop or if there are other activities that make the injury worse (aggravating factors). While you heal, you will be given medicines to control the pain. You will also be taught deep breathing exercises. Severe injuries may require hospitalization or  surgery. Follow these instructions at home: Managing pain, stiffness, and swelling  If directed, apply ice to the injured area. ? Put ice in a plastic bag. ? Place a towel between your skin and the bag. ? Leave the ice on for 20 minutes, 2-3 times a day.  Take over-the-counter and prescription medicines only as told by your health care provider. Activity  Avoid a lot of activity and any activities or movements that cause pain. Be careful during activities and avoid bumping the injured rib.  Slowly increase your activity as told by your health care provider. General instructions  Do deep breathing exercises as told by your health care provider. This helps prevent pneumonia, which is a common complication of a broken rib. Your health care provider may instruct you to: ? Take deep breaths several times a day. ? Try to cough several times a day, holding a pillow against the injured area. ? Use a device called incentive spirometer to practice deep breathing several times a day.  Drink enough fluid to keep your urine pale yellow.  Do not wear a rib belt or binder. These restrict breathing, which can lead to pneumonia.  Keep all follow-up visits as told by your health care provider. This is important. Contact a health care provider if:  You have a fever. Get help right away if:  You have difficulty  breathing or you are short of breath.  You develop a cough that does not stop, or you cough up thick or bloody sputum.  You have nausea, vomiting, or pain in your abdomen.  Your pain gets worse and medicine does not help. Summary  A rib fracture is a break or crack in one of the bones of the ribs.  A broken or cracked rib is often painful but is not usually serious.  Most rib fractures heal on their own over time.  Treatment for this condition depends on the severity of the fracture.  Avoid a lot of activity and any activities or movements that cause pain. This information is not intended to replace advice given to you by your health care provider. Make sure you discuss any questions you have with your health care provider. Document Released: 05/25/2005 Document Revised: 08/24/2016 Document Reviewed: 08/24/2016 Elsevier Interactive Patient Education  2019 [employer]    Pneumothorax A pneumothorax is commonly called a collapsed lung. It is a condition in which air leaks from a lung and builds up between the thin layer of tissue that covers the lungs (visceral pleura) and the interior wall of the chest cavity (parietal pleura). The air gets trapped outside the lung, between the lung and the chest wall (pleural space). The air takes up space and prevents the lung from fully expanding. This condition sometimes occurs suddenly with no apparent cause. The buildup of air may be small or large. A small pneumothorax may go away on its own. A large pneumothorax will require treatment and hospitalization. What are the causes? This condition may be caused by:  Trauma and injury to the chest wall.  Surgery and other medical procedures.  A complication of an underlying lung problem, especially chronic obstructive pulmonary disease (COPD) or emphysema. Sometimes the cause of this condition is not known. What increases the risk? You are more likely to develop this condition if:  You have  an underlying lung problem.  You smoke.  You are 47-3 years old, male, tall, and underweight.  You have a personal or family history of pneumothorax.  You  have an eating disorder (anorexia nervosa). This condition can also happen quickly, even in people with no history of lung problems. What are the signs or symptoms? Sometimes a pneumothorax will have no symptoms. When symptoms are present, they can include:  Chest pain.  Shortness of breath.  Increased rate of breathing.  Bluish color to your lips or skin (cyanosis). How is this diagnosed? This condition may be diagnosed by:  A medical history and physical exam.  A chest X-ray, chest CT scan, or ultrasound. How is this treated? Treatment depends on how severe your condition is. The goal of treatment is to remove the extra air and allow your lung to expand back to its normal size.  For a small pneumothorax: ? No treatment may be needed. ? Extra oxygen is sometimes used to make it go away more quickly.  For a large pneumothorax or a pneumothorax that is causing symptoms, a procedure is done to drain the air from your lungs. To do this, a health care provider may use: ? A needle with a syringe. This is used to suck air from a pleural space where no additional leakage is taking place. ? A chest tube. This is used to suck air where there is ongoing leakage into the pleural space. The chest tube may need to remain in place for several days until the air leak has healed.  In more severe cases, surgery may be needed to repair the damage that is causing the leak.  If you have multiple pneumothorax episodes or have an air leak that will not heal, a procedure called a pleurodesis may be done. A medicine is placed in the pleural space to irritate the tissues around the lung so that the lung will stick to the chest wall, seal any leaks, and stop any buildup of air in that space. If you have an underlying lung problem, severe symptoms,  or a large pneumothorax you will usually need to stay in the hospital. Follow these instructions at home: Lifestyle  Do not use any products that contain nicotine or tobacco, such as cigarettes and e-cigarettes. These are major risk factors in pneumothorax. If you need help quitting, ask your health care provider.  Do not lift anything that is heavier than 10 lb (4.5 kg), or the limit that your health care provider tells you, until he or she says that it is safe.  Avoid activities that take a lot of effort (strenuous) for as long as told by your health care provider.  Return to your normal activities as told by your health care provider. Ask your health care provider what activities are safe for you.  Do not fly in an airplane or scuba dive until your health care provider says it is okay. General instructions  Take over-the-counter and prescription medicines only as told by your health care provider.  If a cough or pain makes it difficult for you to sleep at night, try sleeping in a semi-upright position in a recliner or by using 2 or 3 pillows.  If you had a chest tube and it was removed, ask your health care provider when you can remove the bandage (dressing). While the dressing is in place, do not allow it to get wet.  Keep all follow-up visits as told by your health care provider. This is important. Contact a health care provider if:  You cough up thick mucus (sputum) that is yellow or green in color.  You were treated with a chest tube, and  you have redness, increasing pain, or discharge at the site where it was placed. Get help right away if:  You have increasing chest pain or shortness of breath.  You have a cough that will not go away.  You begin coughing up blood.  You have pain that is getting worse or is not controlled with medicines.  The site where your chest tube was located opens up.  You feel air coming out of the site where the chest tube was placed.  You  have a fever or persistent symptoms for more than 2-3 days.  You have a fever and your symptoms suddenly get worse. These symptoms may represent a serious problem that is an emergency. Do not wait to see if the symptoms will go away. Get medical help right away. Call your local emergency services (911 in the [place] Do not drive yourself to the hospital. Summary  A pneumothorax, commonly called a collapsed lung, is a condition in which air leaks from a lung and gets trapped between the lung and the chest wall (pleural space).  The buildup of air may be small or large. A small pneumothorax may go away on its own. A large pneumothorax will require treatment and hospitalization.  Treatment for this condition depends on how severe the pneumothorax is. The goal of treatment is to remove the extra air and allow the lung to expand back to its normal size. This information is not intended to replace advice given to you by your health care provider. Make sure you discuss any questions you have with your health care provider. Document Released: 05/25/2005 Document Revised: 05/03/2017 Document Reviewed: 05/03/2017 Elsevier Interactive Patient Education  2019 [employer].
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
## 2018-11-23 ENCOUNTER — Encounter (HOSPITAL_COMMUNITY): Payer: Self-pay

## 2018-12-05 ENCOUNTER — Ambulatory Visit
Admission: RE | Admit: 2018-12-05 | Discharge: 2018-12-05 | Disposition: A | Discharge status: Home / Self Care | Payer: Medicare Other | Source: Ambulatory Visit | Attending: Physician Assistant | Admitting: Physician Assistant

## 2018-12-05 ENCOUNTER — Other Ambulatory Visit: Payer: Self-pay | Admitting: Physician Assistant

## 2018-12-05 DIAGNOSIS — J942 Hemothorax: Secondary | ICD-10-CM

## 2019-11-08 ENCOUNTER — Other Ambulatory Visit: Payer: Self-pay | Admitting: Family Medicine

## 2020-07-02 DIAGNOSIS — M545 Low back pain, unspecified: Secondary | ICD-10-CM | POA: Diagnosis not present

## 2020-07-02 DIAGNOSIS — F17200 Nicotine dependence, unspecified, uncomplicated: Secondary | ICD-10-CM | POA: Diagnosis not present

## 2020-07-02 DIAGNOSIS — Z79899 Other long term (current) drug therapy: Secondary | ICD-10-CM | POA: Diagnosis not present

## 2020-07-30 DIAGNOSIS — M545 Low back pain, unspecified: Secondary | ICD-10-CM | POA: Diagnosis not present

## 2020-07-30 DIAGNOSIS — Z79899 Other long term (current) drug therapy: Secondary | ICD-10-CM | POA: Diagnosis not present

## 2020-07-30 DIAGNOSIS — F17200 Nicotine dependence, unspecified, uncomplicated: Secondary | ICD-10-CM | POA: Diagnosis not present

## 2020-08-27 DIAGNOSIS — Z79899 Other long term (current) drug therapy: Secondary | ICD-10-CM | POA: Diagnosis not present

## 2020-08-27 DIAGNOSIS — F17200 Nicotine dependence, unspecified, uncomplicated: Secondary | ICD-10-CM | POA: Diagnosis not present

## 2020-08-27 DIAGNOSIS — M545 Low back pain, unspecified: Secondary | ICD-10-CM | POA: Diagnosis not present

## 2020-09-28 DIAGNOSIS — Z79899 Other long term (current) drug therapy: Secondary | ICD-10-CM | POA: Diagnosis not present

## 2020-09-28 DIAGNOSIS — F17200 Nicotine dependence, unspecified, uncomplicated: Secondary | ICD-10-CM | POA: Diagnosis not present

## 2020-09-28 DIAGNOSIS — M545 Low back pain, unspecified: Secondary | ICD-10-CM | POA: Diagnosis not present

## 2020-10-08 DIAGNOSIS — I10 Essential (primary) hypertension: Secondary | ICD-10-CM | POA: Diagnosis not present

## 2020-10-08 DIAGNOSIS — G629 Polyneuropathy, unspecified: Secondary | ICD-10-CM | POA: Diagnosis not present

## 2020-10-08 DIAGNOSIS — Z720 Tobacco use: Secondary | ICD-10-CM | POA: Diagnosis not present

## 2020-10-08 DIAGNOSIS — G8929 Other chronic pain: Secondary | ICD-10-CM | POA: Diagnosis not present

## 2020-10-08 DIAGNOSIS — E7800 Pure hypercholesterolemia, unspecified: Secondary | ICD-10-CM | POA: Diagnosis not present

## 2020-10-08 DIAGNOSIS — E1149 Type 2 diabetes mellitus with other diabetic neurological complication: Secondary | ICD-10-CM | POA: Diagnosis not present

## 2020-10-08 DIAGNOSIS — Z0000 Encounter for general adult medical examination without abnormal findings: Secondary | ICD-10-CM | POA: Diagnosis not present

## 2020-10-26 DIAGNOSIS — Z79899 Other long term (current) drug therapy: Secondary | ICD-10-CM | POA: Diagnosis not present

## 2020-10-26 DIAGNOSIS — F17200 Nicotine dependence, unspecified, uncomplicated: Secondary | ICD-10-CM | POA: Diagnosis not present

## 2020-10-26 DIAGNOSIS — M545 Low back pain, unspecified: Secondary | ICD-10-CM | POA: Diagnosis not present
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
## 2020-11-23 DIAGNOSIS — Z79899 Other long term (current) drug therapy: Secondary | ICD-10-CM | POA: Diagnosis not present

## 2020-11-23 DIAGNOSIS — F17200 Nicotine dependence, unspecified, uncomplicated: Secondary | ICD-10-CM | POA: Diagnosis not present

## 2020-11-23 DIAGNOSIS — L03114 Cellulitis of left upper limb: Secondary | ICD-10-CM | POA: Diagnosis not present

## 2020-11-23 DIAGNOSIS — M545 Low back pain, unspecified: Secondary | ICD-10-CM | POA: Diagnosis not present

## 2020-12-22 DIAGNOSIS — F17200 Nicotine dependence, unspecified, uncomplicated: Secondary | ICD-10-CM | POA: Diagnosis not present

## 2020-12-22 DIAGNOSIS — M545 Low back pain, unspecified: Secondary | ICD-10-CM | POA: Diagnosis not present

## 2020-12-22 DIAGNOSIS — Z79899 Other long term (current) drug therapy: Secondary | ICD-10-CM | POA: Diagnosis not present

## 2021-01-18 DIAGNOSIS — M545 Low back pain, unspecified: Secondary | ICD-10-CM | POA: Diagnosis not present

## 2021-01-18 DIAGNOSIS — F17200 Nicotine dependence, unspecified, uncomplicated: Secondary | ICD-10-CM | POA: Diagnosis not present

## 2021-01-18 DIAGNOSIS — Z79899 Other long term (current) drug therapy: Secondary | ICD-10-CM | POA: Diagnosis not present

## 2021-02-15 DIAGNOSIS — F17210 Nicotine dependence, cigarettes, uncomplicated: Secondary | ICD-10-CM | POA: Diagnosis not present

## 2021-02-15 DIAGNOSIS — M545 Low back pain, unspecified: Secondary | ICD-10-CM | POA: Diagnosis not present

## 2021-02-15 DIAGNOSIS — Z79899 Other long term (current) drug therapy: Secondary | ICD-10-CM | POA: Diagnosis not present

## 2021-02-18 DIAGNOSIS — Z79899 Other long term (current) drug therapy: Secondary | ICD-10-CM | POA: Diagnosis not present

## 2021-03-18 DIAGNOSIS — M545 Low back pain, unspecified: Secondary | ICD-10-CM | POA: Diagnosis not present

## 2021-03-18 DIAGNOSIS — F17210 Nicotine dependence, cigarettes, uncomplicated: Secondary | ICD-10-CM | POA: Diagnosis not present

## 2021-03-18 DIAGNOSIS — Z79899 Other long term (current) drug therapy: Secondary | ICD-10-CM | POA: Diagnosis not present

## 2021-04-10 DIAGNOSIS — E7800 Pure hypercholesterolemia, unspecified: Secondary | ICD-10-CM | POA: Diagnosis not present

## 2021-04-10 DIAGNOSIS — E1149 Type 2 diabetes mellitus with other diabetic neurological complication: Secondary | ICD-10-CM | POA: Diagnosis not present

## 2021-04-15 DIAGNOSIS — Z0000 Encounter for general adult medical examination without abnormal findings: Secondary | ICD-10-CM | POA: Diagnosis not present

## 2021-04-15 DIAGNOSIS — Z79899 Other long term (current) drug therapy: Secondary | ICD-10-CM | POA: Diagnosis not present

## 2021-04-15 DIAGNOSIS — M545 Low back pain, unspecified: Secondary | ICD-10-CM | POA: Diagnosis not present

## 2021-04-15 DIAGNOSIS — F17210 Nicotine dependence, cigarettes, uncomplicated: Secondary | ICD-10-CM | POA: Diagnosis not present

## 2021-05-13 DIAGNOSIS — Z79899 Other long term (current) drug therapy: Secondary | ICD-10-CM | POA: Diagnosis not present

## 2021-05-13 DIAGNOSIS — M545 Low back pain, unspecified: Secondary | ICD-10-CM | POA: Diagnosis not present

## 2021-05-13 DIAGNOSIS — F17210 Nicotine dependence, cigarettes, uncomplicated: Secondary | ICD-10-CM | POA: Diagnosis not present

## 2021-05-13 DIAGNOSIS — K0889 Other specified disorders of teeth and supporting structures: Secondary | ICD-10-CM | POA: Diagnosis not present

## 2021-05-16 DIAGNOSIS — Z79899 Other long term (current) drug therapy: Secondary | ICD-10-CM | POA: Diagnosis not present

## 2021-06-10 DIAGNOSIS — M545 Low back pain, unspecified: Secondary | ICD-10-CM | POA: Diagnosis not present

## 2021-06-10 DIAGNOSIS — Z79899 Other long term (current) drug therapy: Secondary | ICD-10-CM | POA: Diagnosis not present

## 2021-06-10 DIAGNOSIS — R030 Elevated blood-pressure reading, without diagnosis of hypertension: Secondary | ICD-10-CM | POA: Diagnosis not present

## 2021-06-10 DIAGNOSIS — F17210 Nicotine dependence, cigarettes, uncomplicated: Secondary | ICD-10-CM | POA: Diagnosis not present

## 2021-07-07 DIAGNOSIS — F17210 Nicotine dependence, cigarettes, uncomplicated: Secondary | ICD-10-CM | POA: Diagnosis not present

## 2021-07-07 DIAGNOSIS — Z79899 Other long term (current) drug therapy: Secondary | ICD-10-CM | POA: Diagnosis not present

## 2021-07-07 DIAGNOSIS — M545 Low back pain, unspecified: Secondary | ICD-10-CM | POA: Diagnosis not present

## 2021-07-07 DIAGNOSIS — Z0000 Encounter for general adult medical examination without abnormal findings: Secondary | ICD-10-CM | POA: Diagnosis not present

## 2021-07-13 DIAGNOSIS — Z743 Need for continuous supervision: Secondary | ICD-10-CM | POA: Diagnosis not present

## 2021-07-13 DIAGNOSIS — R404 Transient alteration of awareness: Secondary | ICD-10-CM | POA: Diagnosis not present

## 2021-08-06 DIAGNOSIS — 419620001 Death: Secondary | SNOMED CT | POA: Diagnosis not present

## 2021-08-06 DEATH — deceased
